# Patient Record
Sex: Female | Born: 1980 | Race: Asian | Hispanic: No | Marital: Married | Smoking: Never smoker
Health system: Southern US, Community
[De-identification: ages and names within clinical notes are randomized; demographics above are authoritative.]

## PROBLEM LIST (undated history)

## (undated) DIAGNOSIS — A048 Other specified bacterial intestinal infections: Secondary | ICD-10-CM

## (undated) DIAGNOSIS — O24419 Gestational diabetes mellitus in pregnancy, unspecified control: Secondary | ICD-10-CM

## (undated) DIAGNOSIS — J101 Influenza due to other identified influenza virus with other respiratory manifestations: Secondary | ICD-10-CM

## (undated) HISTORY — PX: NO PAST SURGERIES: SHX2092

## (undated) HISTORY — DX: Other specified bacterial intestinal infections: A04.8

## (undated) HISTORY — DX: Influenza due to other identified influenza virus with other respiratory manifestations: J10.1

---

## 2013-10-09 ENCOUNTER — Other Ambulatory Visit (HOSPITAL_COMMUNITY): Payer: Self-pay | Admitting: Nurse Practitioner

## 2013-10-09 DIAGNOSIS — Z3689 Encounter for other specified antenatal screening: Secondary | ICD-10-CM

## 2013-10-09 LAB — OB RESULTS CONSOLE HEPATITIS B SURFACE ANTIGEN: HEP B S AG: NEGATIVE

## 2013-10-09 LAB — OB RESULTS CONSOLE GC/CHLAMYDIA
CHLAMYDIA, DNA PROBE: NEGATIVE
Gonorrhea: NEGATIVE

## 2013-10-09 LAB — OB RESULTS CONSOLE RUBELLA ANTIBODY, IGM: Rubella: IMMUNE

## 2013-10-09 LAB — OB RESULTS CONSOLE HIV ANTIBODY (ROUTINE TESTING): HIV: NONREACTIVE

## 2013-10-09 LAB — OB RESULTS CONSOLE ANTIBODY SCREEN: Antibody Screen: NEGATIVE

## 2013-10-09 LAB — OB RESULTS CONSOLE ABO/RH: RH Type: POSITIVE

## 2013-10-09 LAB — OB RESULTS CONSOLE RPR: RPR: NONREACTIVE

## 2013-10-15 ENCOUNTER — Other Ambulatory Visit (HOSPITAL_COMMUNITY): Payer: Self-pay | Admitting: Nurse Practitioner

## 2013-10-15 ENCOUNTER — Ambulatory Visit (HOSPITAL_COMMUNITY)
Admission: RE | Admit: 2013-10-15 | Discharge: 2013-10-15 | Disposition: A | Discharge status: Home / Self Care | Payer: Medicaid Other | Source: Ambulatory Visit | Attending: Nurse Practitioner | Admitting: Nurse Practitioner

## 2013-10-15 ENCOUNTER — Encounter (HOSPITAL_COMMUNITY): Payer: Self-pay

## 2013-10-15 DIAGNOSIS — Z3689 Encounter for other specified antenatal screening: Secondary | ICD-10-CM

## 2013-10-15 DIAGNOSIS — O358XX0 Maternal care for other (suspected) fetal abnormality and damage, not applicable or unspecified: Secondary | ICD-10-CM | POA: Insufficient documentation

## 2013-10-15 DIAGNOSIS — Z363 Encounter for antenatal screening for malformations: Secondary | ICD-10-CM | POA: Insufficient documentation

## 2013-10-15 DIAGNOSIS — Z1389 Encounter for screening for other disorder: Secondary | ICD-10-CM | POA: Insufficient documentation

## 2013-12-04 ENCOUNTER — Other Ambulatory Visit (HOSPITAL_COMMUNITY): Payer: Self-pay | Admitting: Nurse Practitioner

## 2013-12-04 DIAGNOSIS — O26843 Uterine size-date discrepancy, third trimester: Secondary | ICD-10-CM

## 2013-12-05 ENCOUNTER — Ambulatory Visit (HOSPITAL_COMMUNITY)
Admission: RE | Admit: 2013-12-05 | Discharge: 2013-12-05 | Disposition: A | Discharge status: Home / Self Care | Payer: Medicaid Other | Source: Ambulatory Visit | Attending: Nurse Practitioner | Admitting: Nurse Practitioner

## 2013-12-05 DIAGNOSIS — O365990 Maternal care for other known or suspected poor fetal growth, unspecified trimester, not applicable or unspecified: Secondary | ICD-10-CM | POA: Insufficient documentation

## 2013-12-05 DIAGNOSIS — O26843 Uterine size-date discrepancy, third trimester: Secondary | ICD-10-CM

## 2013-12-05 DIAGNOSIS — Z3689 Encounter for other specified antenatal screening: Secondary | ICD-10-CM | POA: Insufficient documentation

## 2013-12-11 LAB — OB RESULTS CONSOLE GBS: GBS: NEGATIVE

## 2013-12-18 NOTE — L&D Delivery Note (Signed)
Delivery Note At 5:43 AM a viable and healthy female was delivered via Vaginal, Spontaneous Delivery (Presentation: ROA;  ).  APGAR: 9, 9; weight .   Placenta status: Intact, Spontaneous.  Cord: delivered, intact with the following complications: None.   Anesthesia: None  Episiotomy: None Lacerations: None Suture Repair: n/a Est. Blood Loss (mL): 350  Precipitous delivery with vomiting was performed by RN. I arrived immediately after baby. Placenta delivered without difficulty. Minimal lower labial tear did not require repair. Mom to postpartum.  Baby to Couplet care / Skin to Skin. Placenta to birthing suites.  Beverely Lowdamo, Shamira Toutant 01/15/2014, 6:07 AM

## 2013-12-18 NOTE — L&D Delivery Note (Signed)
I have seen and examined this patient and I agree with the above. I arrived shortly after Dr Richarda BladeAdamo and was present for del of placenta and inspection. SHAW, KIMBERLY 6:18 AM 01/15/2014

## 2013-12-20 ENCOUNTER — Encounter (HOSPITAL_COMMUNITY): Payer: Self-pay | Admitting: Emergency Medicine

## 2013-12-20 ENCOUNTER — Emergency Department (HOSPITAL_COMMUNITY)
Admission: EM | Admit: 2013-12-20 | Discharge: 2013-12-20 | Disposition: A | Discharge status: Home / Self Care | Payer: Medicaid Other | Attending: Emergency Medicine | Admitting: Emergency Medicine

## 2013-12-20 DIAGNOSIS — N949 Unspecified condition associated with female genital organs and menstrual cycle: Secondary | ICD-10-CM

## 2013-12-20 DIAGNOSIS — O9989 Other specified diseases and conditions complicating pregnancy, childbirth and the puerperium: Secondary | ICD-10-CM | POA: Insufficient documentation

## 2013-12-20 DIAGNOSIS — R1032 Left lower quadrant pain: Secondary | ICD-10-CM | POA: Insufficient documentation

## 2013-12-20 NOTE — Progress Notes (Signed)
Spoke with Dr. Shawnie PonsPratt. She has veiwed the fhr tracing. If pt's cervix is closed, she can be d/c home.

## 2013-12-20 NOTE — ED Notes (Signed)
Language barrier, pt speaks Doyce Looseoe Karen & interpreter phone used. Pt c/o left groin pain x 1 week. Pain only with mvmt, walking. Saw OB/GYN Monday for same, has next appt this Monday. Reports pain is not getting better but is not any worse.  Denies any vaginal bleeding, discharge, contractions. EDD 01-05-14

## 2013-12-20 NOTE — ED Notes (Signed)
Per OBGYN RR Nurse pt is appropriate for discharge per [employer] Pr[employer]. MD Juleen ChinaKohut made aware.

## 2013-12-20 NOTE — ED Notes (Signed)
Pt does not speak english, used translator phones. Pt reports walking long distance several days ago and having severe constant groin/pelvic pain since then. Denies any vaginal discharge or bleeding.

## 2013-12-20 NOTE — ED Notes (Signed)
OB RRT nurse at bedside

## 2013-12-20 NOTE — Progress Notes (Signed)
Pt is 375/[redacted] weeks pregnant. Does not speak english. Through an interpreter on the language line, the pt denies ant vaginal bleeding, leaking of fluid. Pt states that she is having left groin pain that has been lasting for a few days now. Pt says that she has the pain when she is up and walking  Around Says that she does not feel the pain when she is sitting.

## 2013-12-22 ENCOUNTER — Ambulatory Visit (HOSPITAL_COMMUNITY)
Admission: RE | Admit: 2013-12-22 | Discharge: 2013-12-22 | Disposition: A | Discharge status: Home / Self Care | Payer: Medicaid Other | Source: Ambulatory Visit | Attending: Nurse Practitioner | Admitting: Nurse Practitioner

## 2013-12-22 ENCOUNTER — Other Ambulatory Visit (HOSPITAL_COMMUNITY): Payer: Self-pay | Admitting: Nurse Practitioner

## 2013-12-22 DIAGNOSIS — O365990 Maternal care for other known or suspected poor fetal growth, unspecified trimester, not applicable or unspecified: Secondary | ICD-10-CM | POA: Insufficient documentation

## 2013-12-22 DIAGNOSIS — Z3689 Encounter for other specified antenatal screening: Secondary | ICD-10-CM

## 2013-12-26 NOTE — ED Provider Notes (Signed)
CSN: [id number]     Arrival date & time 12/20/13  1244 History   First MD Initiated Contact with Patient 12/20/13 1311     Chief Complaint  Patient presents with  . Groin Pain   (Consider location/radiation/quality/duration/timing/severity/associated sxs/prior Treatment) HPI  33 year old female with left lower quadrant/left groin pain. Onset about a week ago after walking for a long distance. Pain has been constant since then. Worse with ambulation and movement. Patient is almost [redacted] weeks pregnant. She denies any vaginal bleeding. No leakage of fluid. No contractions. No acute back pain.  History reviewed. No pertinent past medical history. History reviewed. No pertinent past surgical history. History reviewed. No pertinent family history. History  Substance Use Topics  . Smoking status: Not on file  . Smokeless tobacco: Not on file  . Alcohol Use: No   OB History   Grav Para Term Preterm Abortions TAB SAB Ect Mult Living   3              Review of Systems  All systems reviewed and negative, other than as noted in HPI.   Allergies  Review of patient's allergies indicates no known allergies.  Home Medications  No current outpatient prescriptions on file. BP 96/69  Pulse 76  Resp 16  SpO2 99% Physical Exam  Nursing note and vitals reviewed. Constitutional: She appears well-developed and well-nourished. No distress.  HENT:  Head: Normocephalic and atraumatic.  Eyes: Conjunctivae are normal. Right eye exhibits no discharge. Left eye exhibits no discharge.  Neck: Neck supple.  Cardiovascular: Normal rate, regular rhythm and normal heart sounds.  Exam reveals no gallop and no friction rub.   No murmur heard. Pulmonary/Chest: Effort normal and breath sounds normal. No respiratory distress.  Abdominal: Soft. There is no tenderness.  Gravid uterus with fundus palpable well above umbilicus. Nontender. Bedside ultrasound with fetal movement and heart rate in the 140s.   Musculoskeletal: She exhibits no edema and no tenderness.  Neurological: She is alert.  Skin: Skin is warm and dry.  Psychiatric: She has a normal mood and affect. Her behavior is normal. Thought content normal.    ED Course  Procedures (including critical care time) Labs Review Labs Reviewed - No data to display Imaging Review No results found.  EKG Interpretation   None       MDM   1. Round ligament pain    33 year old female with left lower quadrant/left inguinal pain. Suspect round ligament pain. Patient's abdomen is benign. Reassuring fetal monitoring. No bleeding or leakage of fluid. Patient has followup early next week with obstetrics. Return precautions discussed.    Raeford RazorStephen Tamar Lipscomb, MD 12/26/13 [phone]

## 2014-01-08 ENCOUNTER — Other Ambulatory Visit (HOSPITAL_COMMUNITY): Payer: Self-pay | Admitting: Family

## 2014-01-08 DIAGNOSIS — O480 Post-term pregnancy: Secondary | ICD-10-CM

## 2014-01-09 ENCOUNTER — Telehealth (HOSPITAL_COMMUNITY): Payer: Self-pay | Admitting: *Deleted

## 2014-01-09 ENCOUNTER — Encounter (HOSPITAL_COMMUNITY): Payer: Self-pay | Admitting: *Deleted

## 2014-01-09 NOTE — Telephone Encounter (Signed)
Preadmission screen Interpreter number [facility]

## 2014-01-12 ENCOUNTER — Ambulatory Visit (HOSPITAL_COMMUNITY): Payer: Medicaid Other

## 2014-01-13 ENCOUNTER — Ambulatory Visit (HOSPITAL_COMMUNITY)
Admission: RE | Admit: 2014-01-13 | Discharge: 2014-01-13 | Disposition: A | Discharge status: Home / Self Care | Payer: Medicaid Other | Source: Ambulatory Visit | Attending: Family | Admitting: Family

## 2014-01-13 ENCOUNTER — Ambulatory Visit (HOSPITAL_COMMUNITY): Payer: Medicaid Other

## 2014-01-13 DIAGNOSIS — Z3689 Encounter for other specified antenatal screening: Secondary | ICD-10-CM

## 2014-01-13 DIAGNOSIS — O480 Post-term pregnancy: Secondary | ICD-10-CM | POA: Insufficient documentation

## 2014-01-13 DIAGNOSIS — O0930 Supervision of pregnancy with insufficient antenatal care, unspecified trimester: Secondary | ICD-10-CM

## 2014-01-14 ENCOUNTER — Inpatient Hospital Stay (HOSPITAL_COMMUNITY)
Admission: RE | Admit: 2014-01-14 | Discharge: 2014-01-16 | DRG: 775 | Disposition: A | Discharge status: Home / Self Care | Payer: Medicaid Other | Source: Ambulatory Visit | Attending: Obstetrics & Gynecology | Admitting: Obstetrics & Gynecology

## 2014-01-14 ENCOUNTER — Encounter (HOSPITAL_COMMUNITY): Payer: Self-pay

## 2014-01-14 VITALS — BP 98/62 | HR 74 | Temp 98.5°F | Resp 20 | Ht 60.0 in | Wt 125.0 lb

## 2014-01-14 DIAGNOSIS — O480 Post-term pregnancy: Secondary | ICD-10-CM

## 2014-01-14 DIAGNOSIS — O0930 Supervision of pregnancy with insufficient antenatal care, unspecified trimester: Secondary | ICD-10-CM

## 2014-01-14 DIAGNOSIS — Z3A41 41 weeks gestation of pregnancy: Secondary | ICD-10-CM

## 2014-01-14 LAB — TYPE AND SCREEN
ABO/RH(D): O POS
ANTIBODY SCREEN: NEGATIVE

## 2014-01-14 LAB — CBC
HEMATOCRIT: 35.8 % — AB (ref 36.0–46.0)
HEMOGLOBIN: 12.2 g/dL (ref 12.0–15.0)
MCH: 33.9 pg (ref 26.0–34.0)
MCHC: 34.1 g/dL (ref 30.0–36.0)
MCV: 99.4 fL (ref 78.0–100.0)
Platelets: 224 10*3/uL (ref 150–400)
RBC: 3.6 MIL/uL — AB (ref 3.87–5.11)
RDW: 13 % (ref 11.5–15.5)
WBC: 10.3 10*3/uL (ref 4.0–10.5)

## 2014-01-14 MED ORDER — ONDANSETRON HCL 4 MG/2ML IJ SOLN: 4.0000 mg | Freq: Four times a day (QID) | INTRAMUSCULAR | Status: DC | PRN

## 2014-01-14 MED ORDER — LACTATED RINGERS IV SOLN
INTRAVENOUS | Status: DC
  Administered 2014-01-14 – 2014-01-15 (×2): via INTRAVENOUS

## 2014-01-14 MED ORDER — CITRIC ACID-SODIUM CITRATE 334-500 MG/5ML PO SOLN: 30.0000 mL | ORAL | Status: DC | PRN

## 2014-01-14 MED ORDER — OXYTOCIN BOLUS FROM INFUSION: 500.0000 mL | INTRAVENOUS | Status: DC

## 2014-01-14 MED ORDER — IBUPROFEN 600 MG PO TABS: 600.0000 mg | ORAL_TABLET | Freq: Four times a day (QID) | ORAL | Status: DC | PRN

## 2014-01-14 MED ORDER — LACTATED RINGERS IV SOLN: 500.0000 mL | INTRAVENOUS | Status: DC | PRN

## 2014-01-14 MED ORDER — ZOLPIDEM TARTRATE 5 MG PO TABS: 5.0000 mg | ORAL_TABLET | Freq: Once | ORAL | Status: DC

## 2014-01-14 MED ORDER — OXYCODONE-ACETAMINOPHEN 5-325 MG PO TABS: 1.0000 | ORAL_TABLET | ORAL | Status: DC | PRN

## 2014-01-14 MED ORDER — OXYTOCIN 40 UNITS IN LACTATED RINGERS INFUSION - SIMPLE MED
62.5000 mL/h | INTRAVENOUS | Status: DC
  Filled 2014-01-14: qty 1000

## 2014-01-14 MED ORDER — LIDOCAINE HCL (PF) 1 % IJ SOLN
30.0000 mL | INTRAMUSCULAR | Status: DC | PRN
  Filled 2014-01-14: qty 30

## 2014-01-14 MED ORDER — ACETAMINOPHEN 325 MG PO TABS: 650.0000 mg | ORAL_TABLET | ORAL | Status: DC | PRN

## 2014-01-14 NOTE — Progress Notes (Signed)
RN utilizing [employer] Interpreting Service to admit pt.  No interpreter available that speaks pt's language (Pokoran) but one available that speaks pt's spouse's language.  Interpreter speaking to pt's husband via third party call in order to complete admission.

## 2014-01-14 NOTE — H&P (Signed)
Teresa Clark is a 33 y.o. female presenting for induction for post-dates. Pt reports irregular contractions that are uncomfortable but not very painful. She reports good fetal movement. No bleeding or leakage of fluid. Maternal Medical History:  Reason for admission: Nausea.  Contractions: Frequency: rare.   Perceived severity is mild.    Fetal activity: Perceived fetal activity is normal.    Prenatal complications: No bleeding.   Prenatal Complications - Diabetes: none.    OB History   Grav Para Term Preterm Abortions TAB SAB Ect Mult Living   3 2 2       2      No past medical history on file. No past surgical history on file. Family History: family history is not on file. Social History:  reports that she has never smoked. She does not have any smokeless tobacco history on file. She reports that she does not drink alcohol or use illicit drugs.   Prenatal Transfer Tool  Maternal Diabetes: No Genetic Screening: Normal Maternal Ultrasounds/Referrals: Normal Fetal Ultrasounds or other Referrals:  None Maternal Substance Abuse:  No Significant Maternal Medications:  None Significant Maternal Lab Results:  Lab values include: Group B Strep negative, Other: varicella non-immune Other Comments:  late prenatal care, language barrier  Review of Systems  Constitutional: Negative for fever.  Respiratory: Negative for cough.   Gastrointestinal: Negative for nausea, vomiting and abdominal pain.  Neurological: Negative for headaches.  All other systems reviewed and are negative.    Dilation: 1.5 Effacement (%): 20 Station: -2 Exam by:: Dr. Richarda BladeAdamo Blood pressure 117/74, pulse 73, temperature 98 F (36.7 C), temperature source Oral, resp. rate 20, last menstrual period 03/30/2013. Maternal Exam:  Uterine Assessment: Contraction strength is moderate.  Contraction duration is 80 seconds. Contraction frequency is irregular and rare.   Abdomen: Fetal presentation: vertex  Introitus:  Normal vulva. Normal vagina.  Vagina is negative for discharge.  Pelvis: adequate for delivery.   Cervix: Cervix evaluated by digital exam.     Fetal Exam Fetal Monitor Review: Mode: ultrasound.   Baseline rate: 135.  Variability: moderate (6-25 bpm).   Pattern: accelerations present and no decelerations.    Fetal State Assessment: Category I - tracings are normal.     Physical Exam  Nursing note and vitals reviewed. Constitutional: She is oriented to person, place, and time. She appears well-developed and well-nourished. No distress.  HENT:  Head: Normocephalic and atraumatic.  Eyes: Conjunctivae are normal. Right eye exhibits no discharge. Left eye exhibits no discharge. No scleral icterus.  Neck: Normal range of motion.  Cardiovascular: Normal rate.   Respiratory: Effort normal.  GI: Soft. There is no tenderness.  Genitourinary: Vagina normal and uterus normal. No vaginal discharge found.  Musculoskeletal: Normal range of motion. She exhibits no edema and no tenderness.  Neurological: She is alert and oriented to person, place, and time.  Skin: Skin is warm and dry. She is not diaphoretic.  Psychiatric: She has a normal mood and affect. Her behavior is normal.    Prenatal labs: ABO, Rh: O/Positive/-- (10/23 0000) Antibody: Negative (10/23 0000) Rubella: Immune (10/23 0000) RPR: Nonreactive (10/23 0000)  HBsAg: Negative (10/23 0000)  HIV: Non-reactive (10/23 0000)  GBS: Negative (12/25 0000)   Assessment/Plan: Pt is a 33 y.o. G3P2002 at 9825w3d who presents for induction. LaborAdmitPlan #Labor: Rare, irregular contractions and cervix 1.5cm. Will place foley bulb #Pain: epidural on request #FWB: category 1 #ID: GBS negative    Beverely Lowdamo, Elena 01/14/2014, 9:48 PM  I have  seen and examined this patient and I agree with the above. Cam Hai 12:42 AM 01/15/2014

## 2014-01-15 ENCOUNTER — Encounter (HOSPITAL_COMMUNITY): Payer: Self-pay

## 2014-01-15 LAB — RPR: RPR: NONREACTIVE

## 2014-01-15 LAB — ABO/RH: ABO/RH(D): O POS

## 2014-01-15 MED ORDER — FENTANYL CITRATE 0.05 MG/ML IJ SOLN
100.0000 ug | INTRAMUSCULAR | Status: DC | PRN
  Administered 2014-01-15 (×2): 100 ug via INTRAVENOUS
  Filled 2014-01-15 (×2): qty 2

## 2014-01-15 MED ORDER — ZOLPIDEM TARTRATE 5 MG PO TABS: 5.0000 mg | ORAL_TABLET | Freq: Every evening | ORAL | Status: DC | PRN

## 2014-01-15 MED ORDER — DIBUCAINE 1 % RE OINT: 1.0000 "application " | TOPICAL_OINTMENT | RECTAL | Status: DC | PRN

## 2014-01-15 MED ORDER — WITCH HAZEL-GLYCERIN EX PADS: 1.0000 "application " | MEDICATED_PAD | CUTANEOUS | Status: DC | PRN

## 2014-01-15 MED ORDER — SENNOSIDES-DOCUSATE SODIUM 8.6-50 MG PO TABS
2.0000 | ORAL_TABLET | ORAL | Status: DC
  Filled 2014-01-15: qty 2

## 2014-01-15 MED ORDER — ONDANSETRON HCL 4 MG PO TABS: 4.0000 mg | ORAL_TABLET | ORAL | Status: DC | PRN

## 2014-01-15 MED ORDER — TETANUS-DIPHTH-ACELL PERTUSSIS 5-2.5-18.5 LF-MCG/0.5 IM SUSP: 0.5000 mL | Freq: Once | INTRAMUSCULAR | Status: DC

## 2014-01-15 MED ORDER — DIPHENHYDRAMINE HCL 25 MG PO CAPS: 25.0000 mg | ORAL_CAPSULE | Freq: Four times a day (QID) | ORAL | Status: DC | PRN

## 2014-01-15 MED ORDER — OXYCODONE-ACETAMINOPHEN 5-325 MG PO TABS: 1.0000 | ORAL_TABLET | ORAL | Status: DC | PRN

## 2014-01-15 MED ORDER — BENZOCAINE-MENTHOL 20-0.5 % EX AERO: 1.0000 "application " | INHALATION_SPRAY | CUTANEOUS | Status: DC | PRN

## 2014-01-15 MED ORDER — ONDANSETRON HCL 4 MG/2ML IJ SOLN: 4.0000 mg | INTRAMUSCULAR | Status: DC | PRN

## 2014-01-15 MED ORDER — IBUPROFEN 600 MG PO TABS
600.0000 mg | ORAL_TABLET | Freq: Four times a day (QID) | ORAL | Status: DC
  Administered 2014-01-15 (×2): 600 mg via ORAL
  Filled 2014-01-15 (×2): qty 1

## 2014-01-15 MED ORDER — SIMETHICONE 80 MG PO CHEW: 80.0000 mg | CHEWABLE_TABLET | ORAL | Status: DC | PRN

## 2014-01-15 MED ORDER — PRENATAL MULTIVITAMIN CH: 1.0000 | ORAL_TABLET | Freq: Every day | ORAL | Status: DC

## 2014-01-15 MED ORDER — LANOLIN HYDROUS EX OINT: TOPICAL_OINTMENT | CUTANEOUS | Status: DC | PRN

## 2014-01-15 MED ORDER — MISOPROSTOL 200 MCG PO TABS
ORAL_TABLET | ORAL | Status: AC
  Filled 2014-01-15: qty 5

## 2014-01-15 NOTE — Progress Notes (Signed)
UR completed 

## 2014-01-15 NOTE — Progress Notes (Signed)
I have admitted patient to mother baby room with assistance of an interpreter via speaker phone, which signed paper to be her interpreter.  Assessment was done, paperwork, meal ordered, room safety and baby safety were completed by myself and the nursery RN, Wyona Almasllen Branstrator, again with assistance from the interpreter on speaker phone.  Mother and father have no further questions at this time.

## 2014-01-16 MED ORDER — IBUPROFEN 600 MG PO TABS: 600.0000 mg | ORAL_TABLET | Freq: Four times a day (QID) | ORAL | Status: DC

## 2014-01-16 NOTE — Discharge Summary (Signed)
Obstetric Discharge Summary Reason for Admission: induction of labor  For postdates Prenatal Procedures: NST Intrapartum Procedures: spontaneous vaginal delivery precipitously in the bed with vomiting.  Postpartum Procedures: none Complications-Operative and Postpartum: none Hemoglobin  Date Value Range Status  01/14/2014 12.2  12.0 - 15.0 g/dL Final     HCT  Date Value Range Status  01/14/2014 35.8* 36.0 - 46.0 % Final    Physical Exam:  General: alert, cooperative and no distress Lochia: appropriate Uterine Fundus: firm Incision: na DVT Evaluation: No evidence of DVT seen on physical exam.  Discharge Diagnoses: postdates pregnancy delivered  Discharge Information: Date: 01/16/2014 Activity: pelvic rest Diet: routine Medications: PNV and Ibuprofen Condition: stable Instructions: refer to practice specific booklet Discharge to: home Follow-up Information   [facility] Schedule an appointment as soon as possible for a visit in 4 weeks.   Contact information:   [address] [place] [address] [phone]      Newborn Data: Live born female  Birth Weight: 6 lb 15.6 oz (3165 g) APGAR: 9, 9  Home with mother.  PT presented for postdates induction and progressed well to deliver a liveborn female via NSVD. Her postpartum care was uncomplicated. She is breast feeding and desires depo for contraception.    Candace Begue L 01/16/2014, 7:43 AM

## 2014-01-16 NOTE — Progress Notes (Signed)
[facility]'s [facility] up [facility]. Informed that nurse at health department will call patient to set up follow up appointment.

## 2014-01-16 NOTE — Discharge Instructions (Signed)

## 2014-01-19 NOTE — Discharge Summary (Signed)
Attestation of Attending Supervision of Obstetric Fellow: Evaluation and management procedures were performed by the Obstetric Fellow under my supervision and collaboration.  I have reviewed the Obstetric Fellow's note and chart, and I agree with the management and plan.  Ziad Maye, MD, FACOG Attending Obstetrician & Gynecologist Faculty Practice, [facility]   

## 2014-10-19 ENCOUNTER — Encounter (HOSPITAL_COMMUNITY): Payer: Self-pay

## 2015-09-14 LAB — GLUCOSE, POCT (MANUAL RESULT ENTRY): POC Glucose: 83 mg/dl (ref 70–99)

## 2016-10-17 ENCOUNTER — Encounter (HOSPITAL_COMMUNITY): Payer: Self-pay | Admitting: Emergency Medicine

## 2016-10-17 DIAGNOSIS — R197 Diarrhea, unspecified: Secondary | ICD-10-CM | POA: Insufficient documentation

## 2016-10-17 LAB — COMPREHENSIVE METABOLIC PANEL
ALK PHOS: 63 U/L (ref 38–126)
ALT: 16 U/L (ref 14–54)
AST: 19 U/L (ref 15–41)
Albumin: 4.7 g/dL (ref 3.5–5.0)
Anion gap: 7 (ref 5–15)
BILIRUBIN TOTAL: 0.8 mg/dL (ref 0.3–1.2)
BUN: 11 mg/dL (ref 6–20)
CALCIUM: 10.2 mg/dL (ref 8.9–10.3)
CO2: 22 mmol/L (ref 22–32)
Chloride: 108 mmol/L (ref 101–111)
Creatinine, Ser: 0.88 mg/dL (ref 0.44–1.00)
GFR calc Af Amer: >60 mL/min (ref >60)
Glucose, Bld: 109 mg/dL — ABNORMAL HIGH (ref 65–99)
POTASSIUM: 3.3 mmol/L — AB (ref 3.5–5.1)
Sodium: 137 mmol/L (ref 135–145)
TOTAL PROTEIN: 8.1 g/dL (ref 6.5–8.1)

## 2016-10-17 LAB — URINALYSIS, ROUTINE W REFLEX MICROSCOPIC
BILIRUBIN URINE: NEGATIVE
Glucose, UA: NEGATIVE mg/dL
Hgb urine dipstick: NEGATIVE
KETONES UR: NEGATIVE mg/dL
LEUKOCYTES UA: NEGATIVE
NITRITE: NEGATIVE
PH: 6 (ref 5.0–8.0)
PROTEIN: NEGATIVE mg/dL
Specific Gravity, Urine: 1.018 (ref 1.005–1.030)

## 2016-10-17 LAB — CBC
HEMATOCRIT: 41.5 % (ref 36.0–46.0)
Hemoglobin: 14.1 g/dL (ref 12.0–15.0)
MCH: 31.8 pg (ref 26.0–34.0)
MCHC: 34 g/dL (ref 30.0–36.0)
MCV: 93.5 fL (ref 78.0–100.0)
PLATELETS: 298 10*3/uL (ref 150–400)
RBC: 4.44 MIL/uL (ref 3.87–5.11)
RDW: 12.1 % (ref 11.5–15.5)
WBC: 11.6 10*3/uL — AB (ref 4.0–10.5)

## 2016-10-17 LAB — LIPASE, BLOOD: Lipase: 26 U/L (ref 11–51)

## 2016-10-17 LAB — I-STAT BETA HCG BLOOD, ED (MC, WL, AP ONLY)

## 2016-10-17 NOTE — ED Triage Notes (Signed)
Pt c/o abdominal cramping and diarrhea x20 times.  Sates other family members have the same symptoms. Pain 9/10. Vss.

## 2016-10-18 ENCOUNTER — Emergency Department (HOSPITAL_COMMUNITY)
Admission: EM | Admit: 2016-10-18 | Discharge: 2016-10-18 | Disposition: A | Discharge status: Home / Self Care | Payer: BLUE CROSS/BLUE SHIELD | Attending: Emergency Medicine | Admitting: Emergency Medicine

## 2016-10-18 DIAGNOSIS — R197 Diarrhea, unspecified: Secondary | ICD-10-CM | POA: Diagnosis not present

## 2016-10-18 DIAGNOSIS — R1084 Generalized abdominal pain: Secondary | ICD-10-CM

## 2016-10-18 MED ORDER — KETOROLAC TROMETHAMINE 30 MG/ML IJ SOLN
30.0000 mg | Freq: Once | INTRAMUSCULAR | Status: AC
Start: 2016-10-18 — End: 2016-10-18
  Administered 2016-10-18: 30 mg via INTRAVENOUS
  Filled 2016-10-18: qty 1

## 2016-10-18 MED ORDER — SODIUM CHLORIDE 0.9 % IV BOLUS (SEPSIS)
1000.0000 mL | Freq: Once | INTRAVENOUS | Status: AC
  Administered 2016-10-18: 1000 mL via INTRAVENOUS

## 2016-10-18 NOTE — ED Provider Notes (Signed)
[facility]EMER[facility]ENCY DEPT Provider Note   CSN: [id number] Arrival date & time: 10/17/16  1943   By signing my name below, I, Freida Busmaniana Omoyeni, attest that this documentation has been prepared under the direction and in the presence of Azalia BilisKevin Orenthal Debski, MD . Electronically Signed: Freida Busmaniana Omoyeni, Scribe. 10/18/2016. 12:58 AM.   History   Chief Complaint Chief Complaint  Patient presents with  . Diarrhea  . Abdominal Pain     The history is provided by the patient. No language interpreter was used.    HPI Comments:  Teresa Clark is a 35 y.o. female who presents to the Emergency Department complaining of multiple episodes of diarrhea with associated upper abdominal pain since 10/16/16. She notes her abdominal pain is worse when she has an episode of diarrhea. Pt also notes decreased appetite and back pain. She denies vomiting, fever, and dysuria. No alleviating factors noted. Positive sick contacts, pt's daughters with similar symptoms.   History reviewed. No pertinent past medical history.  Patient Active Problem List   Diagnosis Date Noted  . Post term pregnancy, 41 weeks 01/14/2014    History reviewed. No pertinent surgical history.  OB History    Gravida Para Term Preterm AB Living   3 3 3     3    SAB TAB Ectopic Multiple Live Births           3       Home Medications    Prior to Admission medications   Medication Sig Start Date End Date Taking? Authorizing Provider  ibuprofen (ADVIL,MOTRIN) 600 MG tablet Take 1 tablet (600 mg total) by mouth every 6 (six) hours. 01/16/14   Vale HavenKeli L Beck, MD  Prenatal Vit-Fe Fumarate-FA (MULTIVITAMIN-PRENATAL) 27-0.8 MG TABS tablet Take 1 tablet by mouth daily at 12 noon.    Historical Provider, MD    Family History No family history on file.  Social History Social History  Substance Use Topics  . Smoking status: Never Smoker  . Smokeless tobacco: Not on file  . Alcohol use No     Allergies   Review of patient's allergies indicates no  known allergies.   Review of Systems Review of Systems  10 systems reviewed and all are negative for acute change except as noted in the HPI.  Physical Exam Updated Vital Signs BP 106/71   Pulse (!) 57   Temp 98.6 F (37 C) (Oral)   Resp 18   LMP  (LMP Unknown)   SpO2 99%   Physical Exam  Constitutional: She is oriented to person, place, and time. She appears well-developed and well-nourished. No distress.  HENT:  Head: Normocephalic and atraumatic.  Eyes: EOM are normal.  Neck: Normal range of motion.  Cardiovascular: Normal rate, regular rhythm and normal heart sounds.   Pulmonary/Chest: Effort normal and breath sounds normal.  Abdominal: Soft. She exhibits no distension. There is no tenderness.  Musculoskeletal: Normal range of motion.  Neurological: She is alert and oriented to person, place, and time.  Skin: Skin is warm and dry.  Psychiatric: She has a normal mood and affect. Judgment normal.  Nursing note and vitals reviewed.    ED Treatments / Results  DIAGNOSTIC STUDIES:  Oxygen Saturation is 99% on RA, normal by my interpretation.    COORDINATION OF CARE:  12:58 AM Discussed treatment plan with pt at bedside and pt agreed to plan.  Labs (all labs ordered are listed, but only abnormal results are displayed) Labs Reviewed  COMPREHENSIVE METABOLIC PANEL - Abnormal;  Notable for the following:       Result Value   Potassium 3.3 (*)    Glucose, Bld 109 (*)    All other components within normal limits  CBC - Abnormal; Notable for the following:    WBC 11.6 (*)    All other components within normal limits  URINALYSIS, ROUTINE W REFLEX MICROSCOPIC (NOT AT [facility]) - Abnormal; Notable for the following:    APPearance CLOUDY (*)    All other components within normal limits  LIPASE, BLOOD  I-STAT BETA HCG BLOOD, ED ([facility], [facility], [facility] ONLY)    EKG  EKG Interpretation None       Radiology No results found.  Procedures Procedures (including critical care  time)  Medications Ordered in ED Medications - No data to display   Initial Impression / Assessment and Plan / ED Course  I have reviewed the triage vital signs and the nursing notes.  Pertinent labs & imaging results that were available during my care of the patient were reviewed by me and considered in my medical decision making (see chart for details).  Clinical Course    2:58 AM Patient feels much better this time.  Repeat abdominal exam without tenderness.  Labs without significant abnormality.  Likely contagious diarrhea from her children.  Discharge home in good condition.  Instructed to continue oral hydration at home.  She understands to return to the ER for new or worsening symptoms  Final Clinical Impressions(s) / ED Diagnoses   Final diagnoses:  None    New Prescriptions New Prescriptions   No medications on file   I personally performed the services described in this documentation, which was scribed in my presence. The recorded information has been reviewed and is accurate.        Azalia BilisKevin Kc Sedlak, MD 10/18/16 [phone]

## 2016-10-18 NOTE — ED Notes (Signed)
Pt verbalized understanding discharge instructions and denies any further needs or questions at this time. VS stable, ambulatory and steady gait.   

## 2016-11-08 ENCOUNTER — Ambulatory Visit (INDEPENDENT_AMBULATORY_CARE_PROVIDER_SITE_OTHER): Payer: BLUE CROSS/BLUE SHIELD | Admitting: Obstetrics and Gynecology

## 2016-11-08 ENCOUNTER — Encounter: Payer: Self-pay | Admitting: Obstetrics and Gynecology

## 2016-11-08 VITALS — BP 109/75 | HR 61 | Wt 122.8 lb

## 2016-11-08 DIAGNOSIS — Z01411 Encounter for gynecological examination (general) (routine) with abnormal findings: Secondary | ICD-10-CM

## 2016-11-08 DIAGNOSIS — R1030 Lower abdominal pain, unspecified: Secondary | ICD-10-CM

## 2016-11-08 DIAGNOSIS — Z124 Encounter for screening for malignant neoplasm of cervix: Secondary | ICD-10-CM

## 2016-11-08 DIAGNOSIS — Z113 Encounter for screening for infections with a predominantly sexual mode of transmission: Secondary | ICD-10-CM | POA: Diagnosis not present

## 2016-11-08 DIAGNOSIS — Z01419 Encounter for gynecological examination (general) (routine) without abnormal findings: Secondary | ICD-10-CM

## 2016-11-08 DIAGNOSIS — Z1151 Encounter for screening for human papillomavirus (HPV): Secondary | ICD-10-CM | POA: Diagnosis not present

## 2016-11-08 LAB — POCT URINALYSIS DIP (DEVICE)
BILIRUBIN URINE: NEGATIVE
Glucose, UA: NEGATIVE mg/dL
HGB URINE DIPSTICK: NEGATIVE
KETONES UR: NEGATIVE mg/dL
Nitrite: NEGATIVE
PH: 6 (ref 5.0–8.0)
Protein, ur: NEGATIVE mg/dL
SPECIFIC GRAVITY, URINE: 1.02 (ref 1.005–1.030)
Urobilinogen, UA: 0.2 mg/dL (ref 0.0–1.0)

## 2016-11-08 NOTE — Progress Notes (Signed)
Subjective:     Teresa Clark is a 35 y.o. female G3P3 who is here for a comprehensive physical exam. The patient reports lower pelvic pain for the past 2 months. The pain is present almost daily, worst in the evening and with urination. She denies any abnormal discharge or vaginal bleeding. She is not sexually active. She is using Nexplanon for contraception  No past medical history on file. No past surgical history on file. No family history on file.   Social History   Social History  . Marital status: Married    Spouse name: N/A  . Number of children: N/A  . Years of education: N/A   Occupational History  . Not on file.   Social History Main Topics  . Smoking status: Never Smoker  . Smokeless tobacco: Not on file  . Alcohol use No  . Drug use: No  . Sexual activity: No   Other Topics Concern  . Not on file   Social History Narrative  . No narrative on file   Health Maintenance  Topic Date Due  . TETANUS/TDAP  12/18/2001  . PAP SMEAR  12/19/2003  . INFLUENZA VACCINE  07/18/2016  . HIV Screening  Completed       Review of Systems Pertinent items are noted in HPI.   Objective:  Blood pressure 109/75, pulse 61, weight 122 lb 12.8 oz (55.7 kg), unknown if currently breastfeeding.     GENERAL: Well-developed, well-nourished female in no acute distress.  HEENT: Normocephalic, atraumatic. Sclerae anicteric.  NECK: Supple. Normal thyroid.  LUNGS: Clear to auscultation bilaterally.  HEART: Regular rate and rhythm. BREASTS: Symmetric in size. No palpable masses or lymphadenopathy, skin changes, or nipple drainage. ABDOMEN: Soft, nontender, nondistended. No organomegaly. PELVIC: Normal external female genitalia. Vagina is pink and rugated.  Normal discharge. Normal appearing cervix. Uterus is normal in size. No adnexal mass or tenderness. EXTREMITIES: No cyanosis, clubbing, or edema, 2+ distal pulses.    Assessment:    Healthy female exam.      Plan:    pap smear  with cultures collected Wet prep collected Urine culture  Ultrasound ordered Patient will be contacted with any abnormal results See After Visit Summary for Counseling Recommendations

## 2016-11-08 NOTE — Progress Notes (Signed)
Language Resources Teresa Clark US scheduled for December 1st @ 1500.  Pt notified.

## 2016-11-08 NOTE — Addendum Note (Signed)
Addended by: Garret ReddishBARNES, Alpha Mysliwiec M on: 11/08/2016 05:16 PM   Modules accepted: Orders

## 2016-11-09 LAB — WET PREP, GENITAL
TRICH WET PREP: NONE SEEN
YEAST WET PREP: NONE SEEN

## 2016-11-10 LAB — URINE CULTURE: Organism ID, Bacteria: NO GROWTH

## 2016-11-13 MED ORDER — METRONIDAZOLE 500 MG PO TABS
500.0000 mg | ORAL_TABLET | Freq: Two times a day (BID) | ORAL | 0 refills | Status: DC
Start: 2016-11-13 — End: 2017-01-02

## 2016-11-13 NOTE — Addendum Note (Signed)
Addended by: Catalina AntiguaONSTANT, Cerenity Goshorn on: 11/13/2016 08:23 AM   Modules accepted: Orders

## 2016-11-15 NOTE — Congregational Nurse Program (Signed)
Congregational Nurse Program Note  Date of Encounter: 11/14/2016  Past Medical History: No past medical history on file.  Encounter Details:     CNP Questionnaire - 11/14/16 1000      Patient Demographics   Is this a new or existing patient? New   Patient is considered a/an Refugee   Race Asian     Patient Assistance   Location of Patient Assistance Not Applicable   Patient's financial/insurance status Private Insurance Coverage;Medicaid   Uninsured Patient ([place] Card/Care Connects) No   Patient referred to apply for the following financial assistance Not Applicable   Food insecurities addressed Not Applicable   Transportation assistance Yes   Type of Assistance Bus Pass Given   Assistance securing medications No   Educational health offerings Navigating the healthcare system;Spiritual care     Encounter Details   Primary purpose of visit Navigating the Healthcare System;Spiritual Care/Support Visit   Was an Emergency Department visit averted? Not Applicable   Does patient have a medical provider? Yes   Patient referred to Follow up with established PCP   Was a mental health screening completed? (GAINS tool) No   Does patient have dental issues? No   Does patient have vision issues? No   Does your patient have an abnormal blood pressure today? No   Since previous encounter, have you referred patient for abnormal blood pressure that resulted in a new diagnosis or medication change? No   Does your patient have an abnormal blood glucose today? No   Since previous encounter, have you referred patient for abnormal blood glucose that resulted in a new diagnosis or medication change? No   Was there a life-saving intervention made? No     Office visit for this Burmese lady with visible frustration related to concern regarding scheduled appointment for Transvaginal Ultrasound on 11/17/16 at [facility]. Requested that friend interpret.  Expressed  concern about billing for procedure and contemplating not following through with it. States Medicaid discontinued and unable to confirm insurance or Medicaid coverage. Also requested assistance in [facility]. [facility] on 11-21-16 at 10 am. Return on 11/15/16 to confirm Medicaid and insurance coverage for Ultrasound. Ferol LuzMarietta Adriannah Steinkamp, RN/CN

## 2016-11-16 LAB — CYTOLOGY - PAP
Chlamydia: NEGATIVE
Diagnosis: NEGATIVE
HPV (WINDOPATH): NOT DETECTED
NEISSERIA GONORRHEA: NEGATIVE

## 2016-11-17 ENCOUNTER — Encounter (HOSPITAL_COMMUNITY): Payer: Self-pay

## 2016-11-17 ENCOUNTER — Ambulatory Visit (HOSPITAL_COMMUNITY)
Admission: RE | Admit: 2016-11-17 | Discharge: 2016-11-17 | Disposition: A | Discharge status: Home / Self Care | Payer: BLUE CROSS/BLUE SHIELD | Source: Ambulatory Visit | Attending: Obstetrics and Gynecology | Admitting: Obstetrics and Gynecology

## 2016-11-17 DIAGNOSIS — G8929 Other chronic pain: Secondary | ICD-10-CM | POA: Diagnosis not present

## 2016-11-17 DIAGNOSIS — R102 Pelvic and perineal pain: Secondary | ICD-10-CM | POA: Diagnosis not present

## 2016-11-17 DIAGNOSIS — R1030 Lower abdominal pain, unspecified: Secondary | ICD-10-CM

## 2016-11-24 ENCOUNTER — Telehealth: Payer: Self-pay | Admitting: General Practice

## 2016-11-24 NOTE — Telephone Encounter (Signed)
Per Dr Jolayne Pantheronstant, patient's recent ultrasound is negative and shows no GYN etiology for her pain. Patient does have BV and flagyl has been sent to her pharmacy. This could be the cause of her discomfort. Called patient with Clydie BraunKaren interpreter [phone], patient's husband answered home number stating she wasn't in but would tell her we called. Called patient's mobile number, no answer- left message to call us back for results. Unable to send letter- google translate does not have karen language.

## 2016-11-29 NOTE — Telephone Encounter (Addendum)
Called pt @ home # with [employer] interpreter # [phone] and unable to leave message due to no voice mail. I then called pt's mobile # and left message stating that I am calling with test results and information from the doctor.  There is no emergency and we will call back. I then called pt's pharmacy and was informed that pt has not picked up the medication. The Rx has been saved and will need to be processed once pt has been notified.   12/27  1105  Called pt with [employer] interpreter [phone] and informed her of ultrasound results. She voiced understanding and reports no pain at this time. She also is not having vaginal discharge or irritation and declined medication for BV which was positive on wet prep from 11/22. Pt had no questions.

## 2017-01-02 ENCOUNTER — Encounter (HOSPITAL_COMMUNITY): Payer: Self-pay | Admitting: Emergency Medicine

## 2017-01-02 ENCOUNTER — Ambulatory Visit (HOSPITAL_COMMUNITY)
Admission: EM | Admit: 2017-01-02 | Discharge: 2017-01-02 | Disposition: A | Discharge status: Home / Self Care | Payer: BLUE CROSS/BLUE SHIELD | Attending: Family Medicine | Admitting: Family Medicine

## 2017-01-02 DIAGNOSIS — K529 Noninfective gastroenteritis and colitis, unspecified: Secondary | ICD-10-CM | POA: Diagnosis not present

## 2017-01-02 DIAGNOSIS — A09 Infectious gastroenteritis and colitis, unspecified: Secondary | ICD-10-CM | POA: Diagnosis not present

## 2017-01-02 DIAGNOSIS — R197 Diarrhea, unspecified: Secondary | ICD-10-CM

## 2017-01-02 MED ORDER — ONDANSETRON 4 MG PO TBDP
ORAL_TABLET | ORAL | Status: AC
  Filled 2017-01-02: qty 1

## 2017-01-02 MED ORDER — FAMOTIDINE 20 MG PO TABS
ORAL_TABLET | ORAL | Status: AC
Start: 2017-01-02 — End: 2017-01-02
  Filled 2017-01-02: qty 1

## 2017-01-02 MED ORDER — ONDANSETRON HCL 4 MG PO TABS: 4.0000 mg | ORAL_TABLET | Freq: Four times a day (QID) | ORAL | 0 refills | Status: DC

## 2017-01-02 MED ORDER — GI COCKTAIL ~~LOC~~
30.0000 mL | Freq: Once | ORAL | Status: AC
  Administered 2017-01-02: 30 mL via ORAL

## 2017-01-02 MED ORDER — FAMOTIDINE 20 MG PO TABS
20.0000 mg | ORAL_TABLET | Freq: Once | ORAL | Status: AC
  Administered 2017-01-02: 20 mg via ORAL

## 2017-01-02 MED ORDER — PEDIALYTE PO SOLN: 1000.0000 mL | ORAL | 1 refills | Status: DC

## 2017-01-02 MED ORDER — LOPERAMIDE HCL 2 MG PO CAPS
ORAL_CAPSULE | ORAL | 0 refills | Status: DC
Start: 2017-01-02 — End: 2017-02-01

## 2017-01-02 MED ORDER — GI COCKTAIL ~~LOC~~
ORAL | Status: AC
  Filled 2017-01-02: qty 30

## 2017-01-02 MED ORDER — ONDANSETRON 4 MG PO TBDP
4.0000 mg | ORAL_TABLET | Freq: Once | ORAL | Status: AC
  Administered 2017-01-02: 4 mg via ORAL

## 2017-01-02 NOTE — ED Triage Notes (Addendum)
Pt being assessed using [employer]: [id number]  Pt reports diarrhea, lower back pain and lower abdominal pain for three days. The abdominal pain is the same pain she reported to her PCP in November.  She was put on Flagyl for her symptoms at that time, but states that her MD told her she did not need to take it.  Pt denies any fever or problems with urination.

## 2017-01-02 NOTE — Discharge Instructions (Signed)
Drink clear liquids for the next 24-36 hours. Drink the Pedialyte as described continuously sipping on it. Take Zofran for nausea and vomiting and the Imodium for diarrhea. If you are getting worse, develop fever, see blood in the stool becoming worse in any other way go to the emergency department.

## 2017-01-02 NOTE — ED Provider Notes (Signed)
CSN: [id number]     Arrival date & time 01/02/17  1044 History   First MD Initiated Contact with Patient 01/02/17 1152     Chief Complaint  Patient presents with  . Abdominal Pain  . Back Pain  . Diarrhea   (Consider location/radiation/quality/duration/timing/severity/associated sxs/prior Treatment) 36 year old [place] female accompanied by her husband states that she said diarrhea for 3 days. She is also having epigastric and right upper quadrant abdominal pain to the same amount of time. Today she and last night she has had approximately 20 episodes of watery diarrhea with no bleeding. Yesterday she had 6 episodes. Denies fever, chills or vomiting although she has had nausea. Denies pain in the back is associated with movement. Denies lower abdominal pain. She is taking no medications. Last menstrual period was one week ago and right on time.  Communication through the electronic interpreter. Time room spent 35 minutes.      History reviewed. No pertinent past medical history. History reviewed. No pertinent surgical history. History reviewed. No pertinent family history. Social History  Substance Use Topics  . Smoking status: Never Smoker  . Smokeless tobacco: Never Used  . Alcohol use No   OB History    Gravida Para Term Preterm AB Living   3 3 3     3    SAB TAB Ectopic Multiple Live Births           3      Obstetric Comments   One child deceased around 4 1/2 years of age per patient.       Review of Systems  Constitutional: Positive for activity change. Negative for fever.  HENT: Negative.   Respiratory: Negative.   Cardiovascular: Negative for chest pain and palpitations.  Gastrointestinal: Positive for abdominal pain, diarrhea and nausea. Negative for abdominal distention, blood in stool and vomiting.  Genitourinary: Negative.   Musculoskeletal: Negative.   Neurological: Negative.   All other systems reviewed and are negative.   Allergies  Patient has no known  allergies.  Home Medications   Prior to Admission medications   Medication Sig Start Date End Date Taking? Authorizing Provider  loperamide (IMODIUM) 2 MG capsule Take 1 tab now, 1 tab in 6 hours and if having more than 5 stools in 6 hours take 1 more. Do not take more than 3 in a day. 01/02/17   Hayden Rasmussen, NP  ondansetron (ZOFRAN) 4 MG tablet Take 1 tablet (4 mg total) by mouth every 6 (six) hours. [prn nausea 01/02/17   Hayden Rasmussen, NP  PEDIALYTE (PEDIALYTE) SOLN Take 1,000 mLs by mouth continuous. 01/02/17   Hayden Rasmussen, NP   Meds Ordered and Administered this Visit   Medications  gi cocktail (Maalox,Lidocaine,Donnatal) (not administered)  famotidine (PEPCID) tablet 20 mg (not administered)  ondansetron (ZOFRAN-ODT) disintegrating tablet 4 mg (not administered)    BP 116/78 (BP Location: Left Arm)   Pulse (!) 59   Temp 98.1 F (36.7 C) (Oral)   LMP 12/26/2016 (Approximate)   SpO2 99%  No data found.   Physical Exam  Constitutional: She appears well-developed and well-nourished. No distress.  Eyes: EOM are normal.  Neck: Normal range of motion. Neck supple.  Cardiovascular: Normal rate, regular rhythm, normal heart sounds and intact distal pulses.   Apical rate 72.  Pulmonary/Chest: Effort normal and breath sounds normal. No respiratory distress.  Abdominal: Soft. Bowel sounds are normal. She exhibits no distension.  Mild tenderness to the epigastrium and far right upper quadrant. No tenderness across  the midline or lower abdomen. No masses or guarding. No rebound.  Musculoskeletal: Normal range of motion. She exhibits no edema.  Lymphadenopathy:    She has no cervical adenopathy.  Neurological: She is alert. No cranial nerve deficit.  Skin: Skin is warm and dry.  Nursing note and vitals reviewed.   Urgent Care Course   Clinical Course     Procedures (including critical care time)  Labs Review Labs Reviewed - No data to display  Imaging Review No results  found.   Visual Acuity Review  Right Eye Distance:   Left Eye Distance:   Bilateral Distance:    Right Eye Near:   Left Eye Near:    Bilateral Near:         MDM   1. Gastroenteritis   2. Diarrhea of presumed infectious origin    Drink clear liquids for the next 24-36 hours. Drink the Pedialyte as described continuously sipping on it. Take Zofran for nausea and vomiting and the Imodium for diarrhea. If you are getting worse, develop fever, see blood in the stool becoming worse in any other way go to the emergency department. Meds ordered this encounter  Medications  . gi cocktail (Maalox,Lidocaine,Donnatal)  . famotidine (PEPCID) tablet 20 mg  . ondansetron (ZOFRAN-ODT) disintegrating tablet 4 mg  . ondansetron (ZOFRAN) 4 MG tablet    Sig: Take 1 tablet (4 mg total) by mouth every 6 (six) hours. [prn nausea    Dispense:  12 tablet    Refill:  0    Order Specific Question:   Supervising Provider    Answer:   Linna HoffKINDL, JAMES D [phone]]  . loperamide (IMODIUM) 2 MG capsule    Sig: Take 1 tab now, 1 tab in 6 hours and if having more than 5 stools in 6 hours take 1 more. Do not take more than 3 in a day.    Dispense:  12 capsule    Refill:  0    Order Specific Question:   Supervising Provider    Answer:   Linna HoffKINDL, JAMES D [phone][[id number]]  . PEDIALYTE (PEDIALYTE) SOLN    Sig: Take 1,000 mLs by mouth continuous.    Dispense:  1000 mL    Refill:  1    Order Specific Question:   Supervising Provider    Answer:   Linna HoffKINDL, JAMES D [[id number]]   Due to language barrier and difficulty in understanding pharmacy directions and phone numbers the prescriptions were printed and handed to the patient. They also are advised to continue to try to contact their case worker to help him with obtaining a primary care provider and for cystic with him with these instructions.    Hayden Rasmussenavid Angeligue Bowne, NP 01/02/17 1242

## 2017-02-01 ENCOUNTER — Inpatient Hospital Stay (HOSPITAL_COMMUNITY)
Admission: AD | Admit: 2017-02-01 | Discharge: 2017-02-01 | Disposition: A | Discharge status: Home / Self Care | Payer: BLUE CROSS/BLUE SHIELD | Source: Ambulatory Visit | Attending: Obstetrics & Gynecology | Admitting: Obstetrics & Gynecology

## 2017-02-01 ENCOUNTER — Encounter (HOSPITAL_COMMUNITY): Payer: Self-pay | Admitting: *Deleted

## 2017-02-01 DIAGNOSIS — Z603 Acculturation difficulty: Secondary | ICD-10-CM

## 2017-02-01 DIAGNOSIS — R1013 Epigastric pain: Secondary | ICD-10-CM | POA: Diagnosis not present

## 2017-02-01 DIAGNOSIS — Z789 Other specified health status: Secondary | ICD-10-CM

## 2017-02-01 DIAGNOSIS — M545 Low back pain: Secondary | ICD-10-CM | POA: Diagnosis not present

## 2017-02-01 DIAGNOSIS — K529 Noninfective gastroenteritis and colitis, unspecified: Secondary | ICD-10-CM | POA: Diagnosis not present

## 2017-02-01 DIAGNOSIS — G8929 Other chronic pain: Secondary | ICD-10-CM | POA: Diagnosis not present

## 2017-02-01 DIAGNOSIS — R197 Diarrhea, unspecified: Secondary | ICD-10-CM

## 2017-02-01 LAB — URINALYSIS, ROUTINE W REFLEX MICROSCOPIC
BILIRUBIN URINE: NEGATIVE
HGB URINE DIPSTICK: NEGATIVE
KETONES UR: NEGATIVE mg/dL
NITRITE: NEGATIVE
Protein, ur: NEGATIVE mg/dL
Specific Gravity, Urine: 1 — ABNORMAL LOW (ref 1.005–1.030)
pH: 7 (ref 5.0–8.0)

## 2017-02-01 LAB — COMPREHENSIVE METABOLIC PANEL
ALT: 18 U/L (ref 14–54)
ANION GAP: 6 (ref 5–15)
AST: 17 U/L (ref 15–41)
Albumin: 4 g/dL (ref 3.5–5.0)
Alkaline Phosphatase: 42 U/L (ref 38–126)
BILIRUBIN TOTAL: 0.5 mg/dL (ref 0.3–1.2)
BUN: 6 mg/dL (ref 6–20)
CALCIUM: 8.8 mg/dL — AB (ref 8.9–10.3)
CO2: 25 mmol/L (ref 22–32)
Chloride: 106 mmol/L (ref 101–111)
Creatinine, Ser: 0.72 mg/dL (ref 0.44–1.00)
GFR calc non Af Amer: >60 mL/min (ref >60)
Glucose, Bld: 120 mg/dL — ABNORMAL HIGH (ref 65–99)
POTASSIUM: 3.2 mmol/L — AB (ref 3.5–5.1)
Sodium: 137 mmol/L (ref 135–145)
TOTAL PROTEIN: 7.2 g/dL (ref 6.5–8.1)

## 2017-02-01 LAB — CBC
HEMATOCRIT: 35.8 % — AB (ref 36.0–46.0)
HEMOGLOBIN: 12.3 g/dL (ref 12.0–15.0)
MCH: 32 pg (ref 26.0–34.0)
MCHC: 34.4 g/dL (ref 30.0–36.0)
MCV: 93.2 fL (ref 78.0–100.0)
Platelets: 256 10*3/uL (ref 150–400)
RBC: 3.84 MIL/uL — ABNORMAL LOW (ref 3.87–5.11)
RDW: 12.3 % (ref 11.5–15.5)
WBC: 10.4 10*3/uL (ref 4.0–10.5)

## 2017-02-01 LAB — POCT PREGNANCY, URINE: Preg Test, Ur: NEGATIVE

## 2017-02-01 MED ORDER — LOPERAMIDE HCL 2 MG PO CAPS: 2.0000 mg | ORAL_CAPSULE | Freq: Four times a day (QID) | ORAL | 3 refills | Status: DC | PRN

## 2017-02-01 MED ORDER — GI COCKTAIL ~~LOC~~
30.0000 mL | Freq: Once | ORAL | Status: AC
  Administered 2017-02-01: 30 mL via ORAL
  Filled 2017-02-01: qty 30

## 2017-02-01 MED ORDER — ONDANSETRON 4 MG PO TBDP
4.0000 mg | ORAL_TABLET | Freq: Once | ORAL | Status: AC
  Administered 2017-02-01: 4 mg via ORAL
  Filled 2017-02-01: qty 1

## 2017-02-01 NOTE — Progress Notes (Signed)
Pt dis not understand [employer] [employer].  A [place] interpretor was used with husband to help translate for the patient.

## 2017-02-01 NOTE — Discharge Instructions (Signed)
Stool Culture Why am I having this test? A stool culture tests your stool (feces) for infections of the intestines that are caused by bacteria, a virus, or parasites. Your health care provider may order this test if you have a fever, abdominal pain, and diarrhea that lasts for more than a few days. What kind of sample is taken? A stool sample is required for this test. You will collect the sample when you have a bowel movement. How do I collect samples at home? You will collect a sample of your stool at home. Your health care provider will give you instructions. Carefully follow them each time you collect a sample. Your health care provider will also give you all of the supplies that you need. For each sample that you collect, you may get:  A small container. You may be given different colored containers. Each container may come with different instructions.  Gloves that can be thrown away.  A plastic bag. When collecting the sample:  Do not pour out the fluid that is in the container. This fluid will preserve your sample.  Choose the parts of the sample that are bloody, slimy, or watery.  If your stool is hard, choose samples from each end and the middle.  Do not mix urine, toilet paper, or water with your sample. You may be instructed to collect the sample in the following way:  Before you collect the sample:  Cover the toilet bowl with plastic wrap or a plastic bag.  Tape the wrap or bag to the bowl of the toilet, not to the seat. Do not stretch the plastic tight across the bowl. Leave room for your stool to fall during your bowel movement.  Wash and dry any containers that you were given. Keep them in the bathroom.  When you are ready to collect a sample:  Wash your hands. thoroughly with soap and water.  Put on the gloves that were provided to you.  Using the small shovel that is built into the top of the container, put small scoops of your stool into the container. Fill the  container up to the red line on the label.  Use the shovel to stir the stool sample into the liquid in the container if directed to do so by the instructions that came with the collection container.  Close the lid of the collection container tightly.  Shake the sample until it is well mixed.  On the label, write the date, time, and your initials.  Put the container in the plastic bag that was given to you.  Flush the rest of your stool down the toilet. Throw away the gloves.  Wash your hands thoroughly with soap and water.  Store the sample using the instructions on the container that you used to collect the sample.  Repeat this procedure at a later time if you need to collect another sample. Additional samples should be collected at different times.  Return the sample or samples to your health care provider shortly after you collect them. Check the instructions about when they need to be returned. How do I prepare for this test? If you are a woman and are menstruating, wait 3 days after the end of your menstrual cycle before you collect a sample. What do the results mean? It is your responsibility to obtain your test results. Ask the lab or department performing the test when and how you will get your results. Normal results include:  Normal intestinal flora. This means  that the bacteria and fungi that are present are typically found in your intestines and are present in normal amounts.  No ova or parasite infestation. This means that there is no evidence of parasites in your intestines. Abnormal results will specify the bacteria, virus, or parasite that is responsible for the infection. Talk with your health care provider to discuss your results, treatment options, and if necessary, the need for more tests. Talk with your health care provider if you have any questions about your results. Talk with your health care provider to discuss your results, treatment options, and if necessary,  the need for more tests. Talk with your health care provider if you have any questions about your results. This information is not intended to replace advice given to you by your health care provider. Make sure you discuss any questions you have with your health care provider. Document Released: 01/06/2011 Document Revised: 08/02/2016 Document Reviewed: 04/24/2014 Elsevier Interactive Patient Education  2017 Elsevier Inc. Chronic Diarrhea Diarrhea is frequent loose and watery bowel movements. It can cause you to feel weak and dehydrated. Dehydration can cause you to become tired and thirsty and to have a dry mouth, decreased urination, and dark yellow urine. Diarrhea is a sign of another problem, most often an infection that will not last long. In most cases, diarrhea lasts 2-3 days. Diarrhea that lasts longer than 4 weeks is called long-lasting (chronic) diarrhea. It is important to treat your diarrhea as directed by your health care provider to lessen or prevent future episodes of diarrhea.  CAUSES  There are many causes of chronic diarrhea. The following are some possible causes:   Gastrointestinal infections caused by viruses, bacteria, or parasites.   Food poisoning or food allergies.   Certain medicines, such as antibiotics, chemotherapy, and laxatives.   Artificial sweeteners and fructose.   Digestive disorders, such as celiac disease and inflammatory bowel diseases.   Irritable bowel syndrome.  Some disorders of the pancreas.  Disorders of the thyroid.  Reduced blood flow to the intestines.  Cancer. Sometimes the cause of chronic diarrhea is unknown. RISK FACTORS  Having a severely weakened immune system, such as from HIV or AIDS.   Taking certain types of cancer-fighting drugs (such as with chemotherapy) or other medicines.   Having had a recent organ transplant.   Having a portion of the stomach or small bowel removed.   Traveling to countries where food and  water supplies are often contaminated.  SYMPTOMS  In addition to frequent, loose stools, diarrhea may cause:   Cramping.   Abdominal pain.   Nausea.   Fever.  Fatigue.  Urgent need to use the bathroom.  Loss of bowel control. DIAGNOSIS  Your health care provider must take a careful history and perform a physical exam. Tests given are based on your symptoms and history. Tests may include:   Blood or stool tests. Three or more stool samples may be examined. Stool cultures may be used to test for bacteria or parasites.   X-rays.   A procedure in which a thin tube is inserted into the mouth or rectum (endoscopy). This allows the health care provider to look inside the intestine.  TREATMENT   Treatment is aimed at correcting the cause of the diarrhea when possible.  Diarrhea caused by an infection can often be treated with antibiotic medicines.  Diarrhea not caused by an infection may require you to take long-term medicine or have surgery. Specific treatment should be discussed with your health  care provider.  If the cause cannot be determined, treatment aims to relieve symptoms and prevent dehydration. Serious health problems can occur if you do not maintain proper fluid levels. Treatment may include:  Taking an oral rehydration solution (ORS).  Not drinking beverages that contain caffeine (such as tea, coffee, and soft drinks).  Not drinking alcohol.  Maintaining well-balanced nutrition to help you recover faster. HOME CARE INSTRUCTIONS   Drink enough fluids to keep urine clear or pale yellow. Drink 1 cup (8 oz) of fluid for each diarrhea episode. Avoid fluids that contain simple sugars, fruit juices, whole milk products, and sodas. Hydrate with an ORS. You may purchase the ORS or prepare it at home by mixing the following ingredients together:  ?-? tsp (1.7-3 ? mL) table salt.   tsp (3  mL) baking soda.  ? tsp (1.7 mL) salt substitute containing potassium  chloride.  1 ? tbsp (20 mL) sugar.  4.2 c (1 L) of water.   Certain foods and beverages may increase the speed at which food moves through the gastrointestinal (GI) tract. These foods and beverages should be avoided. They include:  Caffeinated and alcoholic beverages.  High-fiber foods, such as raw fruits and vegetables, nuts, seeds, and whole grain breads and cereals.  Foods and beverages sweetened with sugar alcohols, such as xylitol, sorbitol, and mannitol.   Some foods may be well tolerated and may help thicken stool. These include:  Starchy foods, such as rice, toast, pasta, low-sugar cereal, oatmeal, grits, baked potatoes, crackers, and bagels.  Bananas.  Applesauce.  Add probiotic-rich foods to help increase healthy bacteria in the GI tract. These include yogurt and fermented milk products.  Wash your hands well after each diarrhea episode.  Only take over-the-counter or prescription medicines as directed by your health care provider.  Take a warm bath to relieve any burning or pain from frequent diarrhea episodes. SEEK MEDICAL CARE IF:   You are not urinating as often.  Your urine is a dark color.  You become very tired or dizzy.  You have severe pain in the abdomen or rectum.  Your have blood or pus in your stools.  Your stools look black and tarry. SEEK IMMEDIATE MEDICAL CARE IF:   You are unable to keep fluids down.  You have persistent vomiting.  You have blood in your stool.  Your stools are black and tarry.  You do not urinate in 6-8 hours, or there is only a small amount of very dark urine.  You have abdominal pain that increases or localizes.  You have weakness, dizziness, confusion, or lightheadedness.  You have a severe headache.  Your diarrhea gets worse or does not get better.  You have a fever or persistent symptoms for more than 2-3 days.  You have a fever and your symptoms suddenly get worse. MAKE SURE YOU:   Understand these  instructions.  Will watch your condition.  Will get help right away if you are not doing well or get worse. This information is not intended to replace advice given to you by your health care provider. Make sure you discuss any questions you have with your health care provider. Document Released: 02/24/2004 Document Revised: 12/09/2013 Document Reviewed: 05/29/2013 Elsevier Interactive Patient Education  2017 [employer]. [facility] Guide (Revised August 2014)   Chronic Pain Problems:   [place]:  [phone]           Patients need to be referred by their  primary care doctor/specialist  Insufficient Money for Medicine:           United Way: call "211"    MAP Program at [facility] Department - [place] [phone] or [place] [phone]            No Primary Care Doctor:  To locate a primary care doctor that accepts your insurance or provides certain services:           [place] Connect: [phone]           Physician Referral Service: [phone] ask for My [place]  If no insurance, you need to see if you qualify for [facility] orange card, call to set      up appointment for eligibility/enrollment at [phone] or [phone] or visit [facility]. of Health and [employer] ([address] [place], [place] and [address] -[place]) to meet with a [facility] enrollment specialist.  Agencies that provide inexpensive (sliding fee scale) medical care:       [facility] - Family Medicine at [place] - [phone]     [facility]  -  [facility] Adult Center [phone]     [facility] Internal Medicine - [phone]     [facility] Care & Wellness - [phone]     [facility] [phone]     [facility] Family Practice [phone]  [facility] - [facility] Child Health @ [place] [phone]  [facility]  [facility]icine - [facility] Health @ [place] - [phone]  [facility] Family Practice: [phone]   Women's Clinic: [phone]   Planned Parenthood: [phone]   [facility] [place]    Medicaid-accepting Orthopaedic Ambulatory Surgical Intervention Services Providers:           [facility] [phone] (No Family Planning accepted)          [address], [phone], Mon-Fri 9am-5pm          [facility] - [phone]  [address] [place], [address] [place], Mon-Thursday 8am-5pm, Fri 8am-noon  [employer] - [phone]          [address], Mon-Fri 7:30am-4:30pm          [facility] - [phone]          [address] [place] 8am-5pm          [facility] - [phone] [address]          Only accepts [place] [employer] patients after they have their name applied to their card  Self Pay (no insurance) in [place] Ophthalmic Outpatient Surgery:           Sickle Cell Patients:   [address] [place], [phone] [facility] Internal Medicine:  [address], [place] [phone]       [facility] and Wellness  [address] [place] [phone]  [facility] Family Practice:  [address] [phone]          [facility] Urgent Care           [address] [place], [phone] [facility]  [address] [place], [phone]  [facility] Urgent Care [place]           [address], [place] [phone]        [facility] - [address]           [phone], Mon-Fri 9am-7pm, [place] 9am-1pm          [facility] - Family Medicine @ [facility]          [address] [place], [phone]          [facility] - [facility]           [address] [phone] [facility] -  [facility]  [address] [place] [phone]          [facility]           [address], [phone]  [facility] - [facility]   [address] [place], [place] [phone] [facility] - [facility]  [address] [phone]  Dr. Julio Sicks           [address], [place], [phone]          [facility] Urgent Care           [address], [phone]          [facility]             [address] [phone]          [facility] [place]           [address] [place], [phone], 1st & 3rd Saturday every month, 10am-1pm  OTHERS:  [employer]  (Immigration [employer] Only)  [phone] (Thursday only)  Strategies for finding a Primary Care Provider:  1) Find a Doctor and Pay Out of Pocket  Although you won't have to find out who is covered by your insurance plan, it is a good idea to ask around and get recommendations. You will then need to call the office and see if the doctor you have chosen will accept you as a new patient and what types of options they offer for patients who are self-pay. Some doctors offer discounts or will set up payment plans for their patients who do not have insurance, but you will need to ask so you aren't surprised when you get to your appointment.  2) Contact [place] [employer] - To see if you qualify for orange card access to healthcare safety net providers.  Call for appointment for eligibility/enrollment at [phone] or [phone]. (Uninsured, 0-200% FPL, qualifying info)  Applicants for [facility] are first required to see if they are eligible to enroll in the [facility] Marketplace before enrolling in [facility] (and get an exemption if they are not).  GCCN Criteria for acceptance is:  ? Proof of ACA Marketing exemption - form or documentation  ? Valid photo ID (driver's  license, state identification card, passport, home country ID)  ? Proof of [facility] residency (e.g. drivers license, lease/landlord information, pay stubs with address, utility bill, bank statement, etc.)  ? Proof of income (1040, last year's tax return, W2, 4 current pay stubs, other income proof)  ? Proof of assets (current bank statement + 3 most recent, disability paperwork, life insurance info, tax  value on autos, etc.)  3) Contact Your Local Health Department  Not all health departments have doctors that can see patients for sick visits, but many do, so it is worth a call to see if yours does. If you don't know where your local health department is, you can check in your phone book. The CDC also has a tool to help you locate your state's health department, and many state websites also have listings of all of their local health departments.  4) Find a Walk-in Clinic  If your illness is not likely to be very severe or complicated, you may want to try a walk in clinic. These are popping up all over the country in pharmacies, drugstores, and shopping centers. They're usually staffed by nurse practitioners or physician assistants that have been trained to treat common illnesses and complaints. They're usually fairly quick and inexpensive. However, if you have serious medical issues or chronic medical problems, these are probably not your best option   STD Testing:           [facility], [facility]           [address], [place], phone [phone] or [phone]           Monday - Friday, call for an appointment          [facility] Department of [facility], [place] [facility]           [address], [place], phone [phone] or [phone]           Monday - Friday, call for an appointment Abuse/Neglect:           [facility] Child Abuse Hotline: (506)406-8416           [facility] Child Abuse Hotline:  205-269-9919 (After Hours)  Emergency Shelter:  [facility] (346)127-3034  [facility] [place]- [phone]  [facility] [place] - [phone]  Youth Focus - Act Together - [phone] (ages 4-17)  Homeless Day Shelter @ [employer] - [phone]   Mammograms - Free at [facility] [phone]  Maternity Homes:           Room at the [place] [facility]: [phone]   (Homeless mother with children)          [place] Alert Services: [phone] (Mothers only)  Youth Focus: [phone] (Pregnant 59-33 years old)  Adopt a Mom [phone]  [facility]   [facility] - Lanae Boast  [address] [place] [address], [place] [phone]          [facility]           [address] [place]           [phone]          [place]           [address] [place], [place]           [phone]          [facility] Dept.           [address], [place]           [phone]          [facility] Mental Health           [phone]  [facility] - [facility]           [phone]          [facility] in [place]           [address]           [phone], [facility]urance          [place] Child Abuse Hotline           580-774-6132(336) (248) 797-0911           (819)335-4788(336) (705)[phone] (After Hours)  Behavioral Health Services /Substance Abuse Resources:           Alcohol and Drug Services: [phone]           [facility]: [phone]          [facility]: [phone](336) [phone]   Narcotics Helpline - (209) [phone]          [facility]: 734-629-0930(336) [phone]           Residential & Outpatient Substance Abuse Program - [facility] [place]: ([phone] [phone]  [employer]  Behavioral Health and Wellness Center - [phone]([phone] Psychological Services:          [facility]:  [phone]   Therapeutic Alternatives: [phone]          [facility] Mental Health           [address], [place]           ACCESS LINE: [phone]     (24 Hour)  Mobile Crisis:   HELPLINES:  Financial risk analystational Alliance on Mental Illness - [place] [phone]) [phone] [facility] - [place] (604) [phone](800) [phone]  Walk In [facility] Services       [facility] - [address] - [place]  [phone]       [facility] - [phone] or [phone](336) [phone]  [facility] - [phone] S[address] - [place] [phone]  [facility] [phone]- [address], [place] [phone](336) [phone]   Dental Assistance:  If unable to pay or uninsured, contact: [facility] to become qualified for the adult dental clinic. Patient must be enrolled in [facility] (uninsured, 0-200% FPL, qualifying info).  Enroll in [facility] first, then see Primary Care Physician assigned to you, the PCP makes a dental referral. [place] [facility] will receive referral and contacts patient for appointment.  Patients with Medicaid           [address], [phone]  [facility] [facility] (Children up to 20 + Pregnant Women) - [phone]) [phone]  [facility] - [address] - [phone]  If unable to pay, or uninsured: contact [facility] [phone] in [place] - (Children only + Pregnant Women), [phone] in [facility]- Children only) to become qualified for the adult dental clinic  Must see if eligible to enroll in [facility] Insurance Marketplace before enrolling into the [facility] (exemption required) [phone] for an appointment)  [email or web address];   [phone].  If not eligible for ACA, then go by Department of Health and Human Services to see if eligible for orange card.  [address], [place] and [phone]- [address] [place].  Once you get an orange card, you will have a  Primary Care home who will then refer you to dental if needed.        Other ProofreaderLow-Cost Community Dental  Services:   [facility] [phone] (ext [phone])   [address]  Dr. Lawrence [place] - [phone]   [address]    [place] - [phone]   [facility]           [address] [place], [place], [place], [address]           [phone], Ext. [phone]           2nd and 4th Thursday of the month at 6:30am (Simple extractions only - no wisdom teeth or surgery) First come/First serve -First 10 clients served           [facility] [place], [place] and [place] residents only)          [address] [place], [place], [place], [address]           [phone]                    [facility] Department           [phone]          [facility] Health Department          [phone]         [facility] - [facility]          [phone]   Transportation Options:  Ambulance - 911 - $250-$700 per ride Family Member to accompany patient (if stable) [place] [employer] - [phone]  PART - [phone]  Taxi - [phone] - [place]  [facility] - [phone] (Application required)  [facility] - [phone]

## 2017-02-01 NOTE — MAU Note (Signed)
Urine in lab 

## 2017-02-01 NOTE — MAU Note (Signed)
Plan of care, medications discussed with pt using Clydie BraunKaren interpreter.  Pt crying, but states she understands.

## 2017-02-01 NOTE — MAU Note (Signed)
Pt c/o abd pain and n/v /d x 1 day . Feeling hot and sweaty. Vomited once today and had diarrhea 3-4  Today.

## 2017-02-01 NOTE — MAU Provider Note (Signed)
Chief Complaint:  Abdominal Pain and Diarrhea   First Provider Initiated Contact with Patient 02/01/17 1817       HPI: Teresa Clark is a 36 y.o. [id number] who presents to maternity admissions reporting epigastric pain.  Tearful.  We think she is also stating she has vomiting and diarrhea.  No interpretor present, and electronic interpretor cannot find the right dialect.  No family with her. . She reports no vaginal bleeding, vaginal itching/burning, urinary symptoms, h/a, dizziness, n/v, or fever/chills.    Looks like she was worked up in clinic for GYN possible causes of pain in November.  Workup including [place] was negative.  RN tells me she thinks the patient is under the impression that she is pregnant. She was seen in Urgent Care in January for diarrhea and epigastric pain, but no labs were done. Looks like she was given GI cocktail, zofran and Pepcid.  They prescribed Immodium.  Marland Kitchen   Abdominal Pain  This is a recurrent problem. The current episode started yesterday. The problem occurs constantly. The pain is located in the epigastric region. The quality of the pain is sharp. Associated symptoms include diarrhea, nausea and vomiting. Pertinent negatives include no constipation, fever or myalgias.  Diarrhea   This is a recurrent problem. The current episode started yesterday. The problem occurs 2 to 4 times per day. Associated symptoms include abdominal pain and vomiting. Pertinent negatives include no chills, fever or myalgias. Risk factors include ill contacts.   RN Note: Pt c/o abd pain and n/v /d x 1 day . Feeling hot and sweaty. Vomited once today and had diarrhea 3-4  Today.  Past Medical History: Past Medical History:  Diagnosis Date  . Medical history non-contributory     Past obstetric history: OB History  Gravida Para Term Preterm AB Living  3 3 3     3   SAB TAB Ectopic Multiple Live Births          3    # Outcome Date GA Lbr Len/2nd Weight Sex Delivery Anes PTL Lv  3 Term  01/15/14 [redacted]w[redacted]d  6 lb 15.6 oz (3.165 kg) F Vag-Spont None  LIV     Birth Comments: None noted  2 Term 2011 [redacted]w[redacted]d 09:00 7 lb (3.175 kg) F Vag-Spont   LIV     Birth Comments: Mylasia  1 Term 2006 [redacted]w[redacted]d 03:00  M Vag-Spont None  LIV     Birth Comments: [place]    Obstetric Comments  One child deceased around 95 1/2 years of age per patient.      Past Surgical History: Past Surgical History:  Procedure Laterality Date  . NO PAST SURGERIES      Family History: No family history on file.  Social History: Social History  Substance Use Topics  . Smoking status: Never Smoker  . Smokeless tobacco: Never Used  . Alcohol use No    Allergies: No Known Allergies  Meds:  Prescriptions Prior to Admission  Medication Sig Dispense Refill Last Dose  . loperamide (IMODIUM) 2 MG capsule Take 1 tab now, 1 tab in 6 hours and if having more than 5 stools in 6 hours take 1 more. Do not take more than 3 in a day. 12 capsule 0   . ondansetron (ZOFRAN) 4 MG tablet Take 1 tablet (4 mg total) by mouth every 6 (six) hours. [prn nausea 12 tablet 0   . PEDIALYTE (PEDIALYTE) SOLN Take 1,000 mLs by mouth continuous. 1000 mL 1  I have reviewed patient's Past Medical Hx, Surgical Hx, Family Hx, Social Hx, medications and allergies.  ROS:  Review of Systems  Constitutional: Negative for chills and fever.  Respiratory: Negative for shortness of breath.   Cardiovascular: Negative for leg swelling.  Gastrointestinal: Positive for abdominal pain, diarrhea, nausea and vomiting. Negative for constipation.  Genitourinary: Negative for vaginal bleeding.  Musculoskeletal: Negative for myalgias.   Other systems negative     Physical Exam  Patient Vitals for the past 24 hrs:  BP Temp Pulse Resp  02/01/17 1803 104/73 99.6 F (37.6 C) (!) 56 18   Constitutional: Well-developed, well-nourished female in no acute distress.  Cardiovascular: normal rate and rhythm, no ectopy audible, S1 & S2 heard, no  murmur Respiratory: normal effort, no distress. Lungs CTAB with no wheezes or crackles GI: Abd soft, non-tender except over epigastrum.  Nondistended.  No rebound, No guarding.  Bowel Sounds audible  MS: Extremities nontender, no edema, normal ROM Neurologic: Alert and oriented x 4.   Grossly nonfocal. GU: Neg CVAT. Skin:  Warm and Dry Psych:  Affect appropriate.  PELVIC EXAM: deferred    Labs: Results for orders placed or performed during the hospital encounter of 02/01/17 (from the past 24 hour(s))  Pregnancy, urine POC     Status: None   Collection Time: 02/01/17  5:23 PM  Result Value Ref Range   Preg Test, Ur NEGATIVE NEGATIVE  CBC     Status: Abnormal   Collection Time: 02/01/17  6:41 PM  Result Value Ref Range   WBC 10.4 4.0 - 10.5 K/uL   RBC 3.84 (L) 3.87 - 5.11 MIL/uL   Hemoglobin 12.3 12.0 - 15.0 g/dL   HCT 95.635.8 (L) 38.736.0 - 56.446.0 %   MCV 93.2 78.0 - 100.0 fL   MCH 32.0 26.0 - 34.0 pg   MCHC 34.4 30.0 - 36.0 g/dL   RDW 33.212.3 95.111.5 - 88.415.5 %   Platelets 256 150 - 400 K/uL      Imaging:  No results found.  MAU Course/MDM: I have ordered labs as follows:  CBC and CMET, both normal with no acute findings Imaging ordered: none Exceedingly difficult care due to language problems and inability to get interpretor in her dialect.  Finally, husband arrived and translated to [place], then [place]ese translator helped us.    Discussed this is not a GYN problem This is a digestive problem.  May be infectious diarrhea or some other issue. Needs family doctor and GI referral .  Tried to get stool sample but she could not go. Sent home with supplies.  To bring back to clinic. Message sent to clinic to try to arrange for GI referral since it is now after hours.   Recommend continue Immodium for diarrhea.   Treatments in MAU included GI cocktail with some relief of epigastric pain. Zofran given first for nausea. .   Pt stable at time of discharge.  Assessment: Chronic diarrhea  of unknown origin  Abdominal pain, epigastric  Chronic midline low back pain without sciatica - Only hurts when she has diarrrhea  Diarrhea, unspecified type  Language barrier, cultural differences - Plan: Discharge patient   Plan: Discharge home Recommend referral to GI specialist and also I will order outpatient Stool studies Need to use Burmese interpretor if husband available, then he can speak Po to patient.  Encouraged to return here or to other Urgent Care/ED if she develops worsening of symptoms, increase in pain, fever, or other concerning symptoms.  Wynelle Bourgeois CNM, MSN Certified Nurse-Midwife 02/01/2017 6:57 PM

## 2017-02-03 ENCOUNTER — Other Ambulatory Visit: Payer: Self-pay | Admitting: Advanced Practice Midwife

## 2017-02-03 DIAGNOSIS — R197 Diarrhea, unspecified: Secondary | ICD-10-CM

## 2017-02-03 NOTE — Progress Notes (Unsigned)
Instructed to bring stool to clinic for culture Brought to MAU Orders placed Teresa SignsMarie Clark Eulla Kochanowski, CNM

## 2017-02-07 ENCOUNTER — Telehealth: Payer: Self-pay | Admitting: *Deleted

## 2017-02-07 ENCOUNTER — Encounter: Payer: Self-pay | Admitting: Physician Assistant

## 2017-02-07 DIAGNOSIS — K529 Noninfective gastroenteritis and colitis, unspecified: Secondary | ICD-10-CM

## 2017-02-07 DIAGNOSIS — R109 Unspecified abdominal pain: Secondary | ICD-10-CM

## 2017-02-07 NOTE — Telephone Encounter (Signed)
Per Wynelle BourgeoisMarie Williams, CNM needs referral to GI for abdominal pain and chronic diarrhea. Was seen in MAU. Speaks Burmese dialect called pao. Is supposed to bring a stool specimen to office and we should send it for stool culture, ova and parasites, c. Diff. Also needs referral to primary care doctor.

## 2017-02-07 NOTE — Telephone Encounter (Signed)
Called Callyn with pacifica interpreter [phone] -no pao interpreter available - used karen interpereter at her home number and was told not there and given her mobile number. Called her mobile number and left a message we are calling with some information- please call our office.

## 2017-02-07 NOTE — Telephone Encounter (Addendum)
I called [facility] and made appointment for 02/15/17 11:15. They will schedule an interpreter. They will mail her some paperwork that she should bring with her to her appointment.  When we call patient to tell her of gi appt need to find out location she prefers for [place] [facility] and then can schedule that.

## 2017-02-12 NOTE — Telephone Encounter (Addendum)
Called Teresa Clark with [employer] interpreter- karen-but doesn not understand - was transferred to burmese interpreter [phone] and she did understand, Notified her of referral appointment  And that she would receive paperwork to complete and take with her. She then states she doesn't have insurance anymore- I informed her she would be required to pay at least $200 at the appointment and billed for the rest. She states she is buying some insurance. I instructed her to take the insurance card. She asked if she has to go to the appointment. I informed her it is her choice but if she is still having abdominal pain at times or chronic diarrrhea I would recommend her to keep appointment, but it is her choice.

## 2017-02-13 ENCOUNTER — Ambulatory Visit (HOSPITAL_COMMUNITY)
Admission: EM | Admit: 2017-02-13 | Discharge: 2017-02-13 | Disposition: A | Discharge status: Home / Self Care | Payer: BLUE CROSS/BLUE SHIELD | Attending: Family Medicine | Admitting: Family Medicine

## 2017-02-13 ENCOUNTER — Encounter (HOSPITAL_COMMUNITY): Payer: Self-pay | Admitting: Family Medicine

## 2017-02-13 DIAGNOSIS — A040 Enteropathogenic Escherichia coli infection: Secondary | ICD-10-CM | POA: Insufficient documentation

## 2017-02-13 DIAGNOSIS — R197 Diarrhea, unspecified: Secondary | ICD-10-CM | POA: Diagnosis not present

## 2017-02-13 DIAGNOSIS — R109 Unspecified abdominal pain: Secondary | ICD-10-CM | POA: Diagnosis present

## 2017-02-13 MED ORDER — HYOSCYAMINE SULFATE SL 0.125 MG SL SUBL: 1.0000 | SUBLINGUAL_TABLET | Freq: Three times a day (TID) | SUBLINGUAL | 1 refills | Status: DC | PRN

## 2017-02-13 NOTE — ED Notes (Signed)
Patient in bathroom, attempting stool specimen.  Instructions provided with assistance from translator service

## 2017-02-13 NOTE — Discharge Instructions (Signed)
Please call your doctor to be seen either Thursday or Friday

## 2017-02-13 NOTE — ED Provider Notes (Signed)
[facility]    CSN: [id number] Arrival date & time: 02/13/17  1857     History   Chief Complaint Chief Complaint  Patient presents with  . Abdominal Pain    HPI Teresa Clark is a 36 y.o. female.   Is a 36 year old woman who [facility] with her husband and 2 children complaining of abdominal pain and diarrhea for 3 months. She's been to the emergency room before and given Imodium and Zofran but this is not helped her. The pain and diarrhea are worse after eating and the location of the pain is no right upper quadrant and epigastrium and radiates around to the back.      Past Medical History:  Diagnosis Date  . Medical history non-contributory     Patient Active Problem List   Diagnosis Date Noted  . Language barrier, cultural differences 02/01/2017  . Post term pregnancy, 41 weeks 01/14/2014    Past Surgical History:  Procedure Laterality Date  . NO PAST SURGERIES      OB History    Gravida Para Term Preterm AB Living   3 3 3     3    SAB TAB Ectopic Multiple Live Births           3      Obstetric Comments   One child deceased around 851 1/2 years of age per patient.         Home Medications    Prior to Admission medications   Medication Sig Start Date End Date Taking? Authorizing Provider  Hyoscyamine Sulfate SL (LEVSIN/SL) 0.125 MG SUBL Place 1 tablet under the tongue 3 (three) times daily as needed. 02/13/17   Elvina SidleKurt Ellary Casamento, MD  loperamide (IMODIUM) 2 MG capsule Take 1 capsule (2 mg total) by mouth 4 (four) times daily as needed for diarrhea or loose stools. 02/01/17   Armando ReichertHeather D Hogan, CNM    Family History No family history on file.  Social History Social History  Substance Use Topics  . Smoking status: Never Smoker  . Smokeless tobacco: Never Used  . Alcohol use No     Allergies   Patient has no known allergies.   Review of Systems Review of Systems  Constitutional: Positive for  fatigue.  HENT: Negative.   Respiratory: Negative.   Cardiovascular: Negative.   Gastrointestinal: Positive for diarrhea and nausea.  Genitourinary: Negative.   Musculoskeletal: Negative.   Neurological: Negative.      Physical Exam Triage Vital Signs ED Triage Vitals [02/13/17 1927]  Enc Vitals Group     BP 124/84     Pulse Rate 72     Resp 18     Temp 98.4 F (36.9 C)     Temp Source Oral     SpO2 100 %     Weight      Height      Head Circumference      Peak Flow      Pain Score      Pain Loc      Pain Edu?      Excl. in GC?    No data found.   Updated Vital Signs BP 124/84 (BP Location: Right Arm)   Pulse 72   Temp 98.4 F (36.9 C) (Oral)   Resp 18   LMP 01/18/2017   SpO2 100%    Physical Exam  Constitutional: She is oriented to person, place, and time. She appears well-developed and  well-nourished.  HENT:  Head: Normocephalic.  Right Ear: External ear normal.  Left Ear: External ear normal.  Mouth/Throat: Oropharynx is clear and moist.  Eyes: Conjunctivae and EOM are normal. Pupils are equal, round, and reactive to light.  Neck: Normal range of motion. Neck supple.  Cardiovascular: Normal rate, regular rhythm and normal heart sounds.   Pulmonary/Chest: Effort normal and breath sounds normal.  Abdominal: Soft.  Hyperactive bowel sounds. Tenderness in the right upper quadrant and epigastrium with deep palpation No masses or HSM  Musculoskeletal: Normal range of motion.  Neurological: She is alert and oriented to person, place, and time.  Skin: Skin is warm and dry.  Psychiatric: She has a normal mood and affect.  Nursing note and vitals reviewed.    UC Treatments / Results  Labs (all labs ordered are listed, but only abnormal results are displayed) Labs Reviewed  GASTROINTESTINAL PANEL BY PCR, STOOL (REPLACES STOOL CULTURE)    EKG  EKG Interpretation None       Radiology No results found.  Procedures Procedures (including  critical care time)  Medications Ordered in UC Medications - No data to display   Initial Impression / Assessment and Plan / UC Course  I have reviewed the triage vital signs and the nursing notes.  Pertinent labs & imaging results that were available during my care of the patient were reviewed by me and considered in my medical decision making (see chart for details).     Final Clinical Impressions(s) / UC Diagnoses   Final diagnoses:  Diarrhea, unspecified type    New Prescriptions New Prescriptions   HYOSCYAMINE SULFATE SL (LEVSIN/SL) 0.125 MG SUBL    Place 1 tablet under the tongue 3 (three) times daily as needed.  Patient has an appointment with [facility] gastro[facility] at 11:00 on Thursday   Elvina Sidle, MD 02/13/17 2040

## 2017-02-13 NOTE — ED Notes (Signed)
Unable to provide stool. Notified dr Milus Glazierlauenstein Instructed this nurse to give patient something to drink.   Gave Ginger[place] to patient

## 2017-02-13 NOTE — ED Triage Notes (Signed)
Triaged by Provider using interpreter.

## 2017-02-15 ENCOUNTER — Emergency Department (HOSPITAL_COMMUNITY): Payer: BLUE CROSS/BLUE SHIELD

## 2017-02-15 ENCOUNTER — Ambulatory Visit (INDEPENDENT_AMBULATORY_CARE_PROVIDER_SITE_OTHER): Payer: BLUE CROSS/BLUE SHIELD | Admitting: Physician Assistant

## 2017-02-15 ENCOUNTER — Emergency Department (HOSPITAL_COMMUNITY)
Admission: EM | Admit: 2017-02-15 | Discharge: 2017-02-15 | Disposition: A | Discharge status: Home / Self Care | Payer: BLUE CROSS/BLUE SHIELD | Attending: Emergency Medicine | Admitting: Emergency Medicine

## 2017-02-15 ENCOUNTER — Encounter (HOSPITAL_COMMUNITY): Payer: Self-pay | Admitting: Emergency Medicine

## 2017-02-15 ENCOUNTER — Encounter: Payer: Self-pay | Admitting: Physician Assistant

## 2017-02-15 VITALS — BP 116/66 | HR 66 | Ht 63.0 in | Wt 120.0 lb

## 2017-02-15 DIAGNOSIS — K760 Fatty (change of) liver, not elsewhere classified: Secondary | ICD-10-CM | POA: Diagnosis not present

## 2017-02-15 DIAGNOSIS — K219 Gastro-esophageal reflux disease without esophagitis: Secondary | ICD-10-CM

## 2017-02-15 DIAGNOSIS — Z79899 Other long term (current) drug therapy: Secondary | ICD-10-CM | POA: Diagnosis not present

## 2017-02-15 DIAGNOSIS — R251 Tremor, unspecified: Secondary | ICD-10-CM | POA: Diagnosis not present

## 2017-02-15 DIAGNOSIS — K2900 Acute gastritis without bleeding: Secondary | ICD-10-CM | POA: Diagnosis not present

## 2017-02-15 DIAGNOSIS — F419 Anxiety disorder, unspecified: Secondary | ICD-10-CM

## 2017-02-15 DIAGNOSIS — R1011 Right upper quadrant pain: Secondary | ICD-10-CM | POA: Insufficient documentation

## 2017-02-15 DIAGNOSIS — R109 Unspecified abdominal pain: Secondary | ICD-10-CM | POA: Diagnosis not present

## 2017-02-15 DIAGNOSIS — M545 Low back pain: Secondary | ICD-10-CM | POA: Diagnosis not present

## 2017-02-15 DIAGNOSIS — R110 Nausea: Secondary | ICD-10-CM | POA: Diagnosis not present

## 2017-02-15 DIAGNOSIS — R197 Diarrhea, unspecified: Secondary | ICD-10-CM | POA: Diagnosis present

## 2017-02-15 LAB — CBC
HCT: 40.9 % (ref 36.0–46.0)
HEMOGLOBIN: 14 g/dL (ref 12.0–15.0)
MCH: 32.4 pg (ref 26.0–34.0)
MCHC: 34.2 g/dL (ref 30.0–36.0)
MCV: 94.7 fL (ref 78.0–100.0)
Platelets: 276 10*3/uL (ref 150–400)
RBC: 4.32 MIL/uL (ref 3.87–5.11)
RDW: 12.3 % (ref 11.5–15.5)
WBC: 11.4 10*3/uL — ABNORMAL HIGH (ref 4.0–10.5)

## 2017-02-15 LAB — BASIC METABOLIC PANEL
ANION GAP: 9 (ref 5–15)
BUN: 9 mg/dL (ref 6–20)
CO2: 23 mmol/L (ref 22–32)
Calcium: 9.7 mg/dL (ref 8.9–10.3)
Chloride: 108 mmol/L (ref 101–111)
Creatinine, Ser: 0.83 mg/dL (ref 0.44–1.00)
GFR calc Af Amer: >60 mL/min (ref >60)
GFR calc non Af Amer: >60 mL/min (ref >60)
GLUCOSE: 88 mg/dL (ref 65–99)
POTASSIUM: 3.9 mmol/L (ref 3.5–5.1)
Sodium: 140 mmol/L (ref 135–145)

## 2017-02-15 LAB — CBG MONITORING, ED: Glucose-Capillary: 88 mg/dL (ref 65–99)

## 2017-02-15 LAB — HCG, QUANTITATIVE, PREGNANCY: hCG, Beta Chain, Quant, S: <1 m[IU]/mL (ref <5)

## 2017-02-15 MED ORDER — ONDANSETRON HCL 4 MG PO TABS: ORAL_TABLET | ORAL | 0 refills | Status: DC

## 2017-02-15 MED ORDER — OMEPRAZOLE 40 MG PO CPDR: 40.0000 mg | DELAYED_RELEASE_CAPSULE | Freq: Every day | ORAL | 6 refills | Status: DC

## 2017-02-15 MED ORDER — GI COCKTAIL ~~LOC~~
30.0000 mL | Freq: Once | ORAL | Status: AC
  Administered 2017-02-15: 30 mL via ORAL
  Filled 2017-02-15: qty 30

## 2017-02-15 NOTE — Discharge Instructions (Signed)
Please read and follow all provided instructions.  Your diagnoses today include:  1. Anxiety   2. Acute gastritis without hemorrhage, unspecified gastritis type   3. RUQ pain     Tests performed today include: Blood counts and electrolytes Blood tests to check liver and kidney function Urine test to look for infection and pregnancy (in women) Vital signs. See below for your results today.   Medications prescribed:   Take any prescribed medications only as directed.  Home care instructions:  Follow any educational materials contained in this packet.  Follow-up instructions: Please follow-up with your primary care provider in the next 2 days for further evaluation of your symptoms.    Return instructions:  SEEK IMMEDIATE MEDICAL ATTENTION IF: The pain does not go away or becomes severe  A temperature above 101F develops  Repeated vomiting occurs (multiple episodes)  The pain becomes localized to portions of the abdomen. The right side could possibly be appendicitis. In an adult, the left lower portion of the abdomen could be colitis or diverticulitis.  Blood is being passed in stools or vomit (bright red or black tarry stools)  You develop chest pain, difficulty breathing, dizziness or fainting, or become confused, poorly responsive, or inconsolable (young children) If you have any other emergent concerns regarding your health  Additional Information: Abdominal (belly) pain can be caused by many things. Your caregiver performed an examination and possibly ordered blood/urine tests and imaging (CT scan, x-rays, ultrasound). Many cases can be observed and treated at home after initial evaluation in the emergency department. Even though you are being discharged home, abdominal pain can be unpredictable. Therefore, you need a repeated exam if your pain does not resolve, returns, or worsens. Most patients with abdominal pain don't have to be admitted to the hospital or have surgery, but  serious problems like appendicitis and gallbladder attacks can start out as nonspecific pain. Many abdominal conditions cannot be diagnosed in one visit, so follow-up evaluations are very important.  Your vital signs today were: BP 114/79 (BP Location: Right Arm)    Pulse 64    Temp 98 F (36.7 C)    Resp 14    LMP 01/18/2017    SpO2 100%  If your blood pressure (bp) was elevated above 135/85 this visit, please have this repeated by your doctor within one month. --------------

## 2017-02-15 NOTE — Care Management (Signed)
ED CM noted patient to have [employer] [place] patient can call the customer service numbr on the back of insurance card to obtain list of PCP accepting new patients within her insurance network to schedule post ED follow up visit. Updated [facility] ED nurse. No further ED CM needs identified.

## 2017-02-15 NOTE — Progress Notes (Addendum)
Chief Complaint: Abdominal Pain, GERD and Fatigue   HPI:  Teresa Clark is a 36 year old female who was referred to me by Willodean Rosenthal* for a complaint of 3 months of abdominal pain, GERD and fatigue.    Patient was recently seen in the ER on 02/13/17 after being seen at the urgent care with her husband and 2 children complaining of abdominal pain and diarrhea for 3 months. She had previously been to the emergency room and given Imodium and Zofran but this had not helped. The pain and diarrhea were worse after eating and this is typically in her right upper quadrant and epigastrium and radiated through to her back. Symptoms resolved in ED and she was discharged.   Patient was seen in the ER prior to this on 01/02/17 for diarrhea, epigastric and right upper quadrant abdominal pain. She described 20 episodes of watery diarrhea a day. Patient was diagnosed with gastroenteritis and diarrhea presumed infectious origin. She was given Zofran and Imodium.   Prior to this patient was seen 10/27/16 for diarrhea and upper abdominal pain since 10/16/16. She described abdominal pain worse with an episode of diarrhea. She also described decreased appetite and back pain. Patient was told to continue oral hydration at home and this was thought likely contagious diarrhea from her children who also had similar symptoms.   Per review of chart it appears patient has had stool studies ordered but has never completed them. Patient did have CMP and CBC completed 02/01/17. This showed a low potassium at 3.2 and low calcium at 8.8.   Today, the patient presents to clinic accompanied by her husband and young daughter. Her daughter does cough throughout time of my exam. They do call a friend on the phone who acts as an interpreter as their primary language is Burmese. The patient explains that she has had abdominal pain in the epigastrium and right upper quadrant for almost 3 months now, somewhat worse over the past couple of  days. She does describe constant nausea as well as a feeling of acid in her mouth. Apparently this is no different when she eats or does not.   She and her husband also point out some "red marks" on her back and patient complains of a low back pain.   The patient's largest complaint today as well is that she feels generally fatigued and "unwell". She is "too tired to do anything". They ask about B12 injections as well as various other medications. They also describe dizziness. Her husband also tells me that she is currently on her period.   After time of my immmediate exam, patient does go to the bathroom, when she comes back from the restroom she is walking slowly and visually more pale than she was previously and along with her husband and friends help on the phone describes shortness of breath, numbness in her fingers and a change in her hearing, patient is tearful at this time while clutching her upper abdomen.   Patient denies fever at home, blood in her stool, weight loss or help her medications including Zofran or hyoscyamine.  Past Medical History:  Diagnosis Date  . Medical history non-contributory     Past Surgical History:  Procedure Laterality Date  . NO PAST SURGERIES      Current Outpatient Prescriptions  Medication Sig Dispense Refill  . Hyoscyamine Sulfate SL (LEVSIN/SL) 0.125 MG SUBL Place 1 tablet under the tongue 3 (three) times daily as needed. 60 each 1  . loperamide (IMODIUM)  2 MG capsule Take 1 capsule (2 mg total) by mouth 4 (four) times daily as needed for diarrhea or loose stools. 30 capsule 3   No current facility-administered medications for this visit.     Allergies as of 02/15/2017  . (No Known Allergies)    Family History: Negative for IBD  Social History   Social History  . Marital status: Married    Spouse name: N/A  . Number of children: N/A  . Years of education: N/A   Occupational History  . Not on file.   Social History Main Topics  .  Smoking status: Never Smoker  . Smokeless tobacco: Never Used  . Alcohol use No  . Drug use: No  . Sexual activity: No   Other Topics Concern  . Not on file   Social History Narrative  . No narrative on file    Review of Systems:    Constitutional: Positive for chills and fatigue as well as weakness Skin: Positive for change in skin on patient's lower back Cardiovascular: No chest pain Respiratory: Positive for shortness of breath Gastrointestinal: See HPI and otherwise negative Genitourinary: No dysuria Neurological: Positive for dizziness Musculoskeletal: Positive for new low back pain Hematologic: No bleeding patient is on her period Psychiatric: Patient is depressed in the room   Physical Exam:  Vital signs: BP 116/66   Pulse 66   Ht 5\' 3"  (1.6 m)   Wt 120 lb (54.4 kg)   LMP 01/18/2017   BMI 21.26 kg/m    Constitutional:   Pleasant  female appears to be in moderate distress, Well developed, Well nourished, alert and cooperative, tearful, shaking Head:  Normocephalic and atraumatic. Eyes:   PEERL, EOMI. No icterus. Conjunctiva pink. Ears:  Normal auditory acuity. Neck:  Supple Throat: Oral cavity and pharynx without inflammation, swelling or lesion.  Respiratory: Respirations even and unlabored. Lungs clear to auscultation bilaterally.   No wheezes, crackles, or rhonchi.  Cardiovascular: Normal S1, S2. No MRG. Regular rate and rhythm. No peripheral edema, cyanosis or pallor.  Gastrointestinal:  Soft, nondistended, moderate ttp epigastrum and RUQ  No rebound or guarding. Normal bowel sounds. No appreciable masses or hepatomegaly. Rectal:  Not performed.  Msk:  Symmetrical without gross deformities. Without edema, no deformity or joint abnormality.  Neurologic:  Alert and  oriented x4;  grossly normal neurologically.  Skin:   Dry and intact without significant lesions. Capillaries over lumbar spine Psychiatric:  Demonstrates good judgement and reason without abnormal  affect or behaviors.tearful  MOST RECENT LABS: CBC    Component Value Date/Time   WBC 10.4 02/01/2017 1841   RBC 3.84 (L) 02/01/2017 1841   HGB 12.3 02/01/2017 1841   HCT 35.8 (L) 02/01/2017 1841   PLT 256 02/01/2017 1841   MCV 93.2 02/01/2017 1841   MCH 32.0 02/01/2017 1841   MCHC 34.4 02/01/2017 1841   RDW 12.3 02/01/2017 1841    CMP     Component Value Date/Time   NA 137 02/01/2017 1841   K 3.2 (L) 02/01/2017 1841   CL 106 02/01/2017 1841   CO2 25 02/01/2017 1841   GLUCOSE 120 (H) 02/01/2017 1841   BUN 6 02/01/2017 1841   CREATININE 0.72 02/01/2017 1841   CALCIUM 8.8 (L) 02/01/2017 1841   PROT 7.2 02/01/2017 1841   ALBUMIN 4.0 02/01/2017 1841   AST 17 02/01/2017 1841   ALT 18 02/01/2017 1841   ALKPHOS 42 02/01/2017 1841   BILITOT 0.5 02/01/2017 1841   GFRNONAA >60 02/01/2017  1841   GFRAA >60 02/01/2017 1841    Assessment: 1. Abdominal pain:Patient describes epigastric and right upper quadrant pain, worse over the past 2 days but present for the past 3-4 months, no recent abdominal imaging other than trasnvaginal u/s which was normal, recent CBC and CMP normal, now with reflux and constant nausea; Consider gastritis versus gallbladder etiology versus the flu versus other 2. Nausea: See above 3. GERD: Per chart review this appears to be a new complaint for the patient; consider H. pylori versus gastritis versus other 4. Low back pain: New complaint for the patient, with overall "sickness", consider the flu versus musculoskeletal origin 5. Dizziness: Consider relation to decreased diet and appetite recently? 6. Shortness of breath: This started somewhat acutely at the end of the exam, consider relation to anxiety versus other 7. Numbness: This also started acutely in patient, described in her fingertips, this will need to be evaluated in the ER  Plan: 1. Initially labs are ordered including a lipase and H. pylori antibody. Imaging was also ordered with an ultrasound is  it does not appear patient has had an abdominal imaging since her symptoms started. When patient returned to the room and had onset of acute symptoms and became tearful and her husband became worried she was advised to go to the ER. 2. Patient was helped down to the lobby today in a wheelchair, she was tearful as well as pale and shaking. Our staff did help her get to the ER and I spoke with the ER staff to discuss why she was there. They will need assistance from an interpreter. 3. Medications were sent today for the patient including Omeprazole 40 mg daily and Zofran 4 mg every 4-6 hours for nausea. 4. Patient will return to clinic in the next 2-3 weeks for further evaluation with Dr. Myrtie Neither or myself.    Greater than 80 minutes was spent in consultation and coordination of care for this patient.  Hyacinth Meeker, PA-C [facility] 02/15/2017, 11:24 AM  Cc: Willodean Rosenthal*   Thank you for sending this case to me. I have reviewed the entire note, and the outlined plan seems appropriate. Puzzling story. When she can be seen in clinic and is not in acute distress, sounds as if she may need EGD/colonoscopy if we can get adequate translation services available.  Amada Jupiter, MD

## 2017-02-15 NOTE — ED Triage Notes (Addendum)
Pt c/o episode of body being cold, blue-tinged, body shaking, lasted 20 minutes, no LOC. History 1 similar episode 1 week ago, no other such episodes. Does not take medications. Currently c/o SOB, generalized numbness, no pain.   Speaks Burmese only, history obtained through translator, difficulty obtaining proper history despite Nurse, learning disabilitytranslator.

## 2017-02-15 NOTE — Patient Instructions (Addendum)
Your physician has requested that you go to the basement for lab work before leaving today  You have been [facility] of hospital) on 02/19/2017 at 8:30am. Please arrive 15 minutes prior to your appointment for registration. Make certain not to have anything to eat or drink 6 hours prior to your appointment. Should you need to reschedule your appointment, please contact radiology at [phone]. This test typically takes about 30 minutes to perform.  We have sent the following medications to your pharmacy for you to pick up at your convenience: Omeprazole, Zofran  Use ibuprofen over the counter and a heating pad on your back

## 2017-02-15 NOTE — ED Provider Notes (Signed)
[facility]-EMERGENCY DEPT Provider Note   CSN: [id number] Arrival date & time: 02/15/17  1235     History   Chief Complaint Chief Complaint  Patient presents with  . Shortness of Breath  . Seizures    HPI Teresa Clark is a 36 y.o. female.  HPI  36 y.o. female presents to the Emergency Department today complaining of brief episode of "being cold," with blue discoloration, generalized body shakes, that lasted for 20 minutes. Notes no LOC. No Hx seizures. Pt was at gastroenterologist office when symptoms began due to diarrhea and being referred by OBGYN. Told to come to ED for evaluation. Endorses similar episode x 1 week ago that occurred only once. Pt notes shortness of breath as well as generalized numbness. No pain currently. No N/V. Notes diarrhea x 2-3 days. No fevers. No cough, congestion, No rhinorrhea. No URI symptoms. No urinary symptoms. No dysuria. No vaginal bleeding. No vaginal discharge. No remarkable medical history per patient. No other symptoms noted.   HPI provided by [employer].   Past Medical History:  Diagnosis Date  . Medical history non-contributory     Patient Active Problem List   Diagnosis Date Noted  . Language barrier, cultural differences 02/01/2017  . Post term pregnancy, 41 weeks 01/14/2014    Past Surgical History:  Procedure Laterality Date  . NO PAST SURGERIES      OB History    Gravida Para Term Preterm AB Living   3 3 3     3    SAB TAB Ectopic Multiple Live Births           3      Obstetric Comments   One child deceased around 16 1/2 years of age per patient.         Home Medications    Prior to Admission medications   Medication Sig Start Date End Date Taking? Authorizing Provider  Hyoscyamine Sulfate SL (LEVSIN/SL) 0.125 MG SUBL Place 1 tablet under the tongue 3 (three) times daily as needed. 02/13/17   Elvina Sidle, MD  omeprazole (PRILOSEC) 40 MG capsule Take 1 capsule (40 mg total) by mouth daily. 02/15/17   Unk Lightning, PA  ondansetron [facility]) 4 MG tablet Take one tablet 4-6 hours as needed for nausea 02/15/17   Unk Lightning, PA   Family History History reviewed. No pertinent family history.  Social History Social History  Substance Use Topics  . Smoking status: Never Smoker  . Smokeless tobacco: Never Used  . Alcohol use No   Allergies   Patient has no known allergies.   Review of Systems Review of Systems ROS reviewed and all are negative for acute change except as noted in the HPI.  Physical Exam Updated Vital Signs BP 112/74 (BP Location: Right Arm)   Pulse 66   Temp 98.2 F (36.8 C) (Oral)   Resp 18   LMP 01/18/2017   SpO2 98%   Physical Exam  Constitutional: She is oriented to person, place, and time. Vital signs are normal. She appears well-developed and well-nourished. No distress.  Pt appears anxious. Increased respirations.   HENT:  Head: Normocephalic and atraumatic.  Right Ear: Hearing, tympanic membrane, external ear and ear canal normal.  Left Ear: Hearing, tympanic membrane, external ear and ear canal normal.  Nose: Nose normal.  Mouth/Throat: Uvula is midline, oropharynx is clear and moist and mucous membranes are normal. No trismus in the jaw. No oropharyngeal exudate, posterior oropharyngeal erythema or tonsillar abscesses.  Eyes: Conjunctivae and EOM are normal. Pupils are equal, round, and reactive to light.  Neck: Normal range of motion. Neck supple. No tracheal deviation present.  Cardiovascular: Normal rate, regular rhythm, S1 normal, S2 normal, normal heart sounds, intact distal pulses and normal pulses.   Pulmonary/Chest: Effort normal and breath sounds normal. No accessory muscle usage. No respiratory distress. She has no decreased breath sounds. She has no wheezes. She has no rhonchi. She has no rales.  Abdominal: Normal appearance and bowel sounds are normal. There is no tenderness.  Musculoskeletal: Normal range of motion.  Neurological: She  is alert and oriented to person, place, and time. She has normal strength. No cranial nerve deficit or sensory deficit.  BUE/BLE without decrease in sensation to sharp/dull. Grip strengths equal. Speech clear.  Skin: Skin is warm and dry.  Psychiatric: She has a normal mood and affect. Her speech is normal and behavior is normal. Thought content normal.  Nursing note and vitals reviewed.  ED Treatments / Results  Labs (all labs ordered are listed, but only abnormal results are displayed) Labs Reviewed  CBC - Abnormal; Notable for the following:       Result Value   WBC 11.4 (*)    All other components within normal limits  BASIC METABOLIC PANEL  HCG, QUANTITATIVE, PREGNANCY  CBG MONITORING, ED   EKG  EKG Interpretation None      Radiology Dg Chest 2 View  Result Date: 02/15/2017 CLINICAL DATA:  Tremors, numbness, body feels cold. EXAM: CHEST  2 VIEW COMPARISON:  None. FINDINGS: The heart size and mediastinal contours are within normal limits. Both lungs are clear. The visualized skeletal structures are unremarkable. IMPRESSION: No active cardiopulmonary disease. Electronically Signed   By: Tollie Eth M.D.   On: 02/15/2017 13:56   US Abdomen Limited Ruq  Result Date: 02/15/2017 CLINICAL DATA:  Right upper quadrant pain. EXAM: US ABDOMEN LIMITED - RIGHT UPPER QUADRANT COMPARISON:  01/13/2014. FINDINGS: Gallbladder: Sludge is noted gallbladder. Gallbladder wall thickness 2 mm. Negative Murphy sign. Common bile duct: Diameter: 2.5 mm Liver: Liver is echogenic consistent fatty infiltration and/or hepatocellular disease. Areas of decreased echogenicity noted adjacent to the porta hepatis are most consistent with focal fatty sparing . IMPRESSION: 1. Sludge noted gallbladder. No evidence of cholecystitis. No evidence of biliary distention. 2.  Fatty infiltration liver with focal fatty sparing. Electronically Signed   By: Maisie Fus  Register   On: 02/15/2017 16:32   Procedures Procedures  (including critical care time)  Medications Ordered in ED Medications  gi cocktail (Maalox,Lidocaine,Donnatal) (30 mLs Oral Given 02/15/17 1555)   Initial Impression / Assessment and Plan / ED Course  I have reviewed the triage vital signs and the nursing notes.  Pertinent labs & imaging results that were available during my care of the patient were reviewed by me and considered in my medical decision making (see chart for details).  Final Clinical Impressions(s) / ED Diagnoses  {I have reviewed and evaluated the relevant laboratory values. {I have reviewed and evaluated the relevant imaging studies.  {I have reviewed the relevant previous healthcare records.  {I obtained HPI from historian. {Patient discussed with supervising physician.  ED Course:  Assessment: Pt is a 36 y.o. female who presents with shaking spells with blue discoloration for GI office that occurred x 20 minutes. Per GI note, patient returned from bathroom and began having acute onset of pain likely from epigastrium and started shaking and became tearful. Pt noted to have 3 month hx  of diarrhea as well as nausea with burning sensation in throat. No worsening with PO intake. Pt has had stool studies ordered, but never completed. Pt started on Omeprazole as well as Zofran for symptoms by GI. Follow up scheduled. On exam, pt in NAD. Nontoxic/nonseptic appearing. VSS. Afebrile. Lungs CTA. Heart RRR. Abdomen nontender soft. No acute seizure like activity noted. Appears slightly anxious. CBC unremarkable. BMP unremarkable. CXR negative. EKG unremarkable. Doubt flu as patient without fever and without tachycardia. Even if so, pt outside window with duration of symptoms for Tamiflu treatment. Given GI cocktail in ED due to GI note of gastritis. Doubt gall bladder etiology based on exam. I believe the symptoms today are related to gastritis which caused a vagal reaction. Doubt seizure etiology given history and previous documentation.  Discussed presentation with GI PA at today's visit. Will obtain RUQ Ultrasound for further eval of gall stones. Pt denied diarrhea at GI visit. Bedside review of ultrasound showed no evidence of cholecystitis. No gallstones. Possible biliary sludge noted. Discussed results with patient. Given strict return precautions to ED if symptoms worsen. At time of discharge, Patient is in no acute distress. Vital Signs are stable. Patient is able to ambulate. Patient able to tolerate PO.   Consult placed to Case management to help with PCP follow up.   Disposition/Plan:  DC Home Additional Verbal discharge instructions given and discussed with patient.  Pt Instructed to f/u with PCP in the next week for evaluation and treatment of symptoms. Return precautions given Pt acknowledges and agrees with plan  Supervising Physician Shaune Pollackameron Isaacs, MD  Final diagnoses:  Anxiety  Acute gastritis without hemorrhage, unspecified gastritis type    New Prescriptions New Prescriptions   No medications on file     Audry Piliyler Daniela Siebers, PA-C 02/15/17 1647    Shaune Pollackameron Isaacs, MD 02/15/17 [id number]

## 2017-02-16 LAB — GASTROINTESTINAL PANEL BY PCR, STOOL (REPLACES STOOL CULTURE)

## 2017-02-19 ENCOUNTER — Ambulatory Visit (HOSPITAL_COMMUNITY): Payer: BLUE CROSS/BLUE SHIELD

## 2017-03-06 ENCOUNTER — Encounter: Payer: Self-pay | Admitting: Gastroenterology

## 2017-03-06 ENCOUNTER — Ambulatory Visit (INDEPENDENT_AMBULATORY_CARE_PROVIDER_SITE_OTHER): Payer: BLUE CROSS/BLUE SHIELD | Admitting: Gastroenterology

## 2017-03-06 VITALS — BP 108/80 | HR 100 | Ht 59.5 in | Wt 123.0 lb

## 2017-03-06 DIAGNOSIS — R197 Diarrhea, unspecified: Secondary | ICD-10-CM | POA: Diagnosis not present

## 2017-03-06 DIAGNOSIS — R1084 Generalized abdominal pain: Secondary | ICD-10-CM

## 2017-03-06 DIAGNOSIS — R110 Nausea: Secondary | ICD-10-CM

## 2017-03-06 MED ORDER — NA SULFATE-K SULFATE-MG SULF 17.5-3.13-1.6 GM/177ML PO SOLN: 1.0000 | Freq: Once | ORAL | 0 refills | Status: AC

## 2017-03-06 NOTE — Patient Instructions (Signed)
If you are age 34 or older, your body mass index should be between 23-30. Your Body mass index is 24.43 kg/m. If this is out of the aforementioned range listed, please consider follow up with your Primary Care Provider.  If you are age 764 or younger, your body mass index should be between 19-25. Your Body mass index is 24.43 kg/m. If this is out of the aformentioned range listed, please consider follow up with your Primary Care Provider.   You have been scheduled for an endoscopy and colonoscopy. Please follow the written instructions given to you at your visit today. Please pick up your prep supplies at the pharmacy within the next 1-3 days. If you use inhalers (even only as needed), please bring them with you on the day of your procedure.  Thank you for choosing [facility]  Dr Amada JupiterHenry Danis III

## 2017-03-06 NOTE — Progress Notes (Signed)
[facility] Progress Note  Chief Complaint: Abdominal pain and diarrhea.  Subjective  History:  This woman follows up after her initial clinic visit 3 weeks ago with one of our PAs. Please see that lengthy note for details of this patient's story. Even with the aid of an interpreter today, I had considerable difficulty getting a clear and consistent history of the symptoms. She has had multiple ED visits for a variety of GI symptoms that have been evolving and migratory over time. It seems that she may still be having some diarrhea, but usually has 1 BM day. It is sometimes loose and it does not seem as if there is been blood. She had a positive stool study for EP EC, but that was felt possibly unrelated and did not need antibiotics. She seems to still have some upper GI symptoms in the way of nausea and heartburn.  When I brought up the possibility of anxiety being related to these symptoms (given the apparent panic attack the patient had in our clinic 3 weeks ago), they seem somewhat reluctant to accept that as an explanation.  She does not know much of anything about her family history.  As near as I can tell, her appetite has been reasonably good and weight stable.   ROS: Cardiovascular:  no chest pain Respiratory: no dyspnea  The patient's Past Medical, Family and Social History were reviewed and are on file in the EMR.  Objective:  Med list reviewed  Vital signs in last 24 hrs: Vitals:   03/06/17 1550  BP: 108/80  Pulse: 100    Physical Exam  She is somewhat anxious appearing with poor eye contact. She and her husband seem to have lengthy conversations that even the interpreter is having difficulty following when I ask for clarification of symptoms.  HEENT: sclera anicteric, oral mucosa moist without lesions  Neck: supple, no thyromegaly, JVD or lymphadenopathy  Cardiac: RRR without murmurs, S1S2 heard, no peripheral edema  Pulm: clear to auscultation  bilaterally, normal RR and effort noted  Abdomen: soft, No tenderness, with active bowel sounds. No guarding or palpable hepatosplenomegaly.  Skin; warm and dry, no jaundice or rash  Recent Labs:  CBC Latest Ref Rng & Units 02/15/2017 02/01/2017 10/17/2016  WBC 4.0 - 10.5 K/uL 11.4(H) 10.4 11.6(H)  Hemoglobin 12.0 - 15.0 g/dL 60.6 30.1 60.1  Hematocrit 36.0 - 46.0 % 40.9 35.8(L) 41.5  Platelets 150 - 400 K/uL 276 256 298   CMP Latest Ref Rng & Units 02/15/2017 02/01/2017 10/17/2016  Glucose 65 - 99 mg/dL 88 093(A) 355(D)  BUN 6 - 20 mg/dL 9 6 11   Creatinine 0.44 - 1.00 mg/dL 3.22 0.25 4.27  Sodium 135 - 145 mmol/L 140 137 137  Potassium 3.5 - 5.1 mmol/L 3.9 3.2(L) 3.3(L)  Chloride 101 - 111 mmol/L 108 106 108  CO2 22 - 32 mmol/L 23 25 22   Calcium 8.9 - 10.3 mg/dL 9.7 0.6(C) 37.6  Total Protein 6.5 - 8.1 g/dL - 7.2 8.1  Total Bilirubin 0.3 - 1.2 mg/dL - 0.5 0.8  Alkaline Phos 38 - 126 U/L - 42 63  AST 15 - 41 U/L - 17 19  ALT 14 - 54 U/L - 18 16    Radiologic studies:  US - GB sludge  @ASSESSMENTPLANBEGIN @ Assessment: Encounter Diagnoses  Name Primary?  . Generalized abdominal pain Yes  . Diarrhea, unspecified type   . Nausea     I have been unable to further characterize these  symptoms to determine if they fit the pattern of an organic digestive disease. I suspect there may be significant anxiety component given our experience with her thus far. Negative lab and imaging workup so far has been reassuring.  Plan:  EGD and colonoscopy. Procedures described in detail with the interpreter present. She is agreeable. Even if these studies are normal, I am hopeful they will be somewhat reassuring and she can move on with alternate therapy.  The benefits and risks of the planned procedure were described in detail with the patient or (when appropriate) their health care proxy.  Risks were outlined as including, but not limited to, bleeding, infection, perforation, adverse medication  reaction leading to cardiac or pulmonary decompensation, or pancreatitis (if ERCP).  The limitation of incomplete mucosal visualization was also discussed.  No guarantees or warranties were given.   Total time 45 minutes, over half spent in counseling and coordination of care. Extra time was required given the difficulty obtaining history and need for interpreter services.  Charlie PitterHenry L Danis III

## 2017-03-30 ENCOUNTER — Encounter: Payer: Self-pay | Admitting: Gastroenterology

## 2017-04-11 ENCOUNTER — Ambulatory Visit (AMBULATORY_SURGERY_CENTER): Payer: BLUE CROSS/BLUE SHIELD | Admitting: Gastroenterology

## 2017-04-11 ENCOUNTER — Encounter: Payer: Self-pay | Admitting: Gastroenterology

## 2017-04-11 VITALS — BP 102/67 | HR 63 | Temp 98.9°F | Resp 21 | Ht 59.0 in | Wt 123.0 lb

## 2017-04-11 DIAGNOSIS — R1084 Generalized abdominal pain: Secondary | ICD-10-CM | POA: Diagnosis not present

## 2017-04-11 DIAGNOSIS — R197 Diarrhea, unspecified: Secondary | ICD-10-CM

## 2017-04-11 DIAGNOSIS — K2950 Unspecified chronic gastritis without bleeding: Secondary | ICD-10-CM | POA: Diagnosis not present

## 2017-04-11 MED ORDER — SODIUM CHLORIDE 0.9 % IV SOLN: 500.0000 mL | INTRAVENOUS | Status: DC

## 2017-04-11 NOTE — Patient Instructions (Signed)
YOU HAD AN ENDOSCOPIC PROCEDURE TODAY AT THE [facility]:   Refer to the procedure report that was given to you for any specific questions about what was found during the examination.  If the procedure report does not answer your questions, please call your gastroenterologist to clarify.  If you requested that your care partner not be given the details of your procedure findings, then the procedure report has been included in a sealed envelope for you to review at your convenience later.  YOU SHOULD EXPECT: Some feelings of bloating in the abdomen. Passage of more gas than usual.  Walking can help get rid of the air that was put into your GI tract during the procedure and reduce the bloating. If you had a lower endoscopy (such as a colonoscopy or flexible sigmoidoscopy) you may notice spotting of blood in your stool or on the toilet paper. If you underwent a bowel prep for your procedure, you may not have a normal bowel movement for a few days.  Please Note:  You might notice some irritation and congestion in your nose or some drainage.  This is from the oxygen used during your procedure.  There is no need for concern and it should clear up in a day or so.  SYMPTOMS TO REPORT IMMEDIATELY:   Following lower endoscopy (colonoscopy or flexible sigmoidoscopy):  Excessive amounts of blood in the stool  Significant tenderness or worsening of abdominal pains  Swelling of the abdomen that is new, acute  Fever of 100F or higher   Following upper endoscopy (EGD)  Vomiting of blood or coffee ground material  New chest pain or pain under the shoulder blades  Painful or persistently difficult swallowing  New shortness of breath  Fever of 100F or higher  Black, tarry-looking stools  For urgent or emergent issues, a gastroenterologist can be reached at any hour by calling [phone].   DIET:  We do recommend a small meal at first, but then you may proceed to your regular diet.  Drink  plenty of fluids but you should avoid alcoholic beverages for 24 hours.  ACTIVITY:  You should plan to take it easy for the rest of today and you should NOT DRIVE or use heavy machinery until tomorrow (because of the sedation medicines used during the test).    FOLLOW UP: Our staff will call the number listed on your records the next business day following your procedure to check on you and address any questions or concerns that you may have regarding the information given to you following your procedure. If we do not reach you, we will leave a message.  However, if you are feeling well and you are not experiencing any problems, there is no need to return our call.  We will assume that you have returned to your regular daily activities without incident.  If any biopsies were taken you will be contacted by phone or by letter within the next 1-3 weeks.  Please call us at [phone] if you have not heard about the biopsies in 3 weeks.   Await for biopsy results   SIGNATURES/CONFIDENTIALITY: You and/or your care partner have signed paperwork which will be entered into your electronic medical record.  These signatures attest to the fact that that the information above on your After Visit Summary has been reviewed and is understood.  Full responsibility of the confidentiality of this discharge information lies with you and/or your care-partner. 

## 2017-04-11 NOTE — Progress Notes (Signed)
Pt's states no medical or surgical changes since previsit or office visit. 

## 2017-04-11 NOTE — Progress Notes (Signed)
A and O x3. Report to RN. Tolerated MAC anesthesia well.Teeth unchanged after procedure.

## 2017-04-11 NOTE — Op Note (Signed)
[facility] Patient Name: Teresa Clark Procedure Date: 04/11/2017 2:50 PM MRN: [id number] Endoscopist: Sherilyn Cooter L. Myrtie Neither , MD Age: 36 Referring MD:  Date of Birth: [birth year] Gender: Female Account #: [id number] Procedure:                Upper GI endoscopy Indications:              Generalized abdominal pain, Diarrhea Medicines:                Monitored Anesthesia Care Procedure:                Pre-Anesthesia Assessment:                           - Prior to the procedure, a History and Physical                            was performed, and patient medications and                            allergies were reviewed. The patient's tolerance of                            previous anesthesia was also reviewed. The risks                            and benefits of the procedure and the sedation                            options and risks were discussed with the patient.                            All questions were answered, and informed consent                            was obtained. Prior Anticoagulants: The patient has                            taken no previous anticoagulant or antiplatelet                            agents. ASA Grade Assessment: II - A patient with                            mild systemic disease. After reviewing the risks                            and benefits, the patient was deemed in                            satisfactory condition to undergo the procedure.                           After obtaining informed consent, the endoscope was  passed under direct vision. Throughout the                            procedure, the patient's blood pressure, pulse, and                            oxygen saturations were monitored continuously. The                            Endoscope was introduced through the mouth, and                            advanced to the second part of duodenum. The upper                            GI endoscopy was  accomplished without difficulty.                            The patient tolerated the procedure well. Scope In: Scope Out: Findings:                 The larynx was normal.                           The esophagus was normal.                           Multiple, non-bleeding erosions were found in the                            gastric antrum. There were no stigmata of recent                            bleeding. Several biopsies were obtained in the                            gastric body and in the gastric antrum with cold                            forceps for histology.                           The cardia and gastric fundus were normal on                            retroflexion.                           The examined duodenum was normal. Biopsies were                            taken with a cold forceps for histology. Complications:            No immediate complications. Estimated Blood Loss:     Estimated blood loss: none. Impression:               -  Normal larynx.                           - Normal esophagus.                           - Non-bleeding erosive gastropathy.                           - Normal examined duodenum. Biopsied.                           - Several biopsies were obtained in the gastric                            body and in the gastric antrum. Recommendation:           - Patient has a contact number available for                            emergencies. The signs and symptoms of potential                            delayed complications were discussed with the                            patient. Return to normal activities tomorrow.                            Written discharge instructions were provided to the                            patient.                           - Resume previous diet.                           - Continue present medications.                           - See the other procedure note for documentation of                             additional recommendations. Jesslynn Kruck L. Myrtie Neither, MD 04/11/2017 3:18:25 PM This report has been signed electronically.

## 2017-04-11 NOTE — Progress Notes (Signed)
Called to room to assist during endoscopic procedure.  Patient ID and intended procedure confirmed with present staff. Received instructions for my participation in the procedure from the performing physician.  

## 2017-04-11 NOTE — Op Note (Signed)
[facility] Patient Name: Teresa Clark Procedure Date: 04/11/2017 2:50 PM MRN: [id number] Endoscopist: Sherilyn Cooter L. Myrtie Neither , MD Age: 36 Referring MD:  Date of Birth: [birth year] Gender: Female Account #: [id number] Procedure:                Colonoscopy Indications:              Generalized abdominal pain, Clinically significant                            diarrhea of unexplained origin Medicines:                Monitored Anesthesia Care Procedure:                Pre-Anesthesia Assessment:                           - Prior to the procedure, a History and Physical                            was performed, and patient medications and                            allergies were reviewed. The patient's tolerance of                            previous anesthesia was also reviewed. The risks                            and benefits of the procedure and the sedation                            options and risks were discussed with the patient.                            All questions were answered, and informed consent                            was obtained. Prior Anticoagulants: The patient has                            taken no previous anticoagulant or antiplatelet                            agents. ASA Grade Assessment: II - A patient with                            mild systemic disease. After reviewing the risks                            and benefits, the patient was deemed in                            satisfactory condition to undergo the procedure.  After obtaining informed consent, the colonoscope                            was passed under direct vision. Throughout the                            procedure, the patient's blood pressure, pulse, and                            oxygen saturations were monitored continuously. The                            Colonoscope was introduced through the anus and                            advanced to the the terminal ileum.  The colonoscopy                            was performed without difficulty. The patient                            tolerated the procedure well. The quality of the                            bowel preparation was excellent. The terminal                            ileum, ileocecal valve, appendiceal orifice, and                            rectum were photographed. The quality of the bowel                            preparation was evaluated using the BBPS [facility]                            Bowel Preparation Scale) with scores of: Right                            Colon = 3, Transverse Colon = 3 and Left Colon = 3                            (entire mucosa seen well with no residual staining,                            small fragments of stool or opaque liquid). The                            total BBPS score equals 9. The bowel preparation                            used was SUPREP. Scope In: 2:03:00 PM Scope Out: 3:12:52 PM Scope Withdrawal Time: 1  hour 6 minutes 52 seconds  Total Procedure Duration: 1 hour 9 minutes 52 seconds  Findings:                 The perianal and digital rectal examinations were                            normal.                           The terminal ileum appeared normal.                           The colon (entire examined portion) appeared                            normal. Biopsies for histology were taken with a                            cold forceps from the right colon and left colon                            for evaluation of microscopic colitis.                           The exam was otherwise without abnormality on                            direct and retroflexion views. Complications:            No immediate complications. Estimated Blood Loss:     Estimated blood loss: none. Impression:               - The examined portion of the ileum was normal.                           - The entire examined colon is normal. Biopsied.                            - The examination was otherwise normal on direct                            and retroflexion views. Recommendation:           - Patient has a contact number available for                            emergencies. The signs and symptoms of potential                            delayed complications were discussed with the                            patient. Return to normal activities tomorrow.                            Written discharge instructions were provided to the  patient.                           - Resume previous diet.                           - Continue present medications.                           - Await pathology results.                           - No recommendation at this time regarding repeat                            colonoscopy due to age. Sinai Mahany L. Myrtie Neither, MD 04/11/2017 3:20:23 PM This report has been signed electronically.

## 2017-04-12 ENCOUNTER — Telehealth: Payer: Self-pay | Admitting: *Deleted

## 2017-04-12 NOTE — Telephone Encounter (Signed)
  Follow up Call-  Call back number 04/11/2017  Post procedure Call Back phone  # [phone] Belinda Fisher friend for call back  Permission to leave phone message Yes  Some recent data might be hidden     Patient questions:  Phone message states that the phone # does not accept outside calls.

## 2017-04-17 ENCOUNTER — Encounter: Payer: Self-pay | Admitting: Gastroenterology

## 2017-04-18 ENCOUNTER — Other Ambulatory Visit: Payer: Self-pay

## 2017-04-19 ENCOUNTER — Other Ambulatory Visit: Payer: Self-pay

## 2017-04-23 ENCOUNTER — Other Ambulatory Visit: Payer: Self-pay

## 2017-04-23 ENCOUNTER — Telehealth: Payer: Self-pay | Admitting: Gastroenterology

## 2017-04-23 MED ORDER — BISMUTH SUBSALICYLATE 262 MG PO CHEW
524.0000 mg | CHEWABLE_TABLET | Freq: Four times a day (QID) | ORAL | 0 refills | Status: AC
Start: 2017-04-23 — End: 2017-05-03

## 2017-04-23 MED ORDER — DOXYCYCLINE HYCLATE 100 MG PO TABS: 100.0000 mg | ORAL_TABLET | Freq: Two times a day (BID) | ORAL | 0 refills | Status: AC

## 2017-04-23 MED ORDER — METRONIDAZOLE 250 MG PO TABS: 250.0000 mg | ORAL_TABLET | Freq: Four times a day (QID) | ORAL | 0 refills | Status: AC

## 2017-05-25 ENCOUNTER — Telehealth: Payer: Self-pay

## 2017-05-25 ENCOUNTER — Other Ambulatory Visit: Payer: Self-pay

## 2017-05-25 DIAGNOSIS — A048 Other specified bacterial intestinal infections: Secondary | ICD-10-CM

## 2017-05-25 NOTE — Telephone Encounter (Signed)
Mailed letter to patient re: need to retest for H. Pylori.

## 2017-05-25 NOTE — Telephone Encounter (Signed)
-----   Message from Charlie PitterHenry L Danis III, MD sent at 05/24/2017 12:52 PM EDT ----- If you can. Clelia SchaumannGonna be tough since not Alban[place]English speaking. No PPI 5 days prior. Stool study easier with need to interpret instructions? That are breath test - whatever you think we can get done.  - HD ----- Message ----- From: Leverne HumblesFournier, Taiga Lupinacci M, RN Sent: 05/24/2017   9:32 AM To: Charlie PitterHenry L Danis III, MD  Looking back at the letter sent and result note, I just wanted to see if you wanted to retest patient for H. Pylori, she should have finished her antibiotics on 5/17-18. Thanks.

## 2017-06-08 ENCOUNTER — Telehealth: Payer: Self-pay

## 2017-06-08 NOTE — Telephone Encounter (Signed)
Telephone call to [employer] to assist with Burmese language. Nicole CellaDorothy Jesica Goheen RN,BSN,PCCN,CNP [phone]

## 2017-06-08 NOTE — Congregational Nurse Program (Signed)
Congregational Nurse Program Note  Date of Encounter: 06/08/2017  Past Medical History: Past Medical History:  Diagnosis Date  . Medical history non-contributory     Encounter Details:     CNP Questionnaire - 06/08/17 1300      Patient Demographics   Is this a new or existing patient? Existing   Patient is considered a/an Refugee     Patient Assistance   Location of Patient Assistance [place]   Patient's financial/insurance status Private Insurance Coverage;Medicaid   Uninsured Patient ([place] Card/Care Connects) No   Patient referred to apply for the following financial assistance Not Applicable   Food insecurities addressed Not Applicable   Transportation assistance No   Assistance securing medications No   Educational health offerings Navigating the healthcare system;Spiritual care     Encounter Details   Primary purpose of visit Navigating the Healthcare System;Spiritual Care/Support Visit   Was an Emergency Department visit averted? Not Applicable   Does patient have a medical provider? Yes   Patient referred to Other   Was a mental health screening completed? (GAINS tool) No   Does patient have dental issues? No   Does patient have vision issues? No   Does your patient have an abnormal blood pressure today? No   Since previous encounter, have you referred patient for abnormal blood pressure that resulted in a new diagnosis or medication change? No   Does your patient have an abnormal blood glucose today? No   Since previous encounter, have you referred patient for abnormal blood glucose that resulted in a new diagnosis or medication change? No   Was there a life-saving intervention made? No     Client came to the the community center requesting assistance with reading and understanding appoitment letter from gastro entologist office. Letter was explained in details with the help of [employer]. Client was reminded to stop taking acid reflux medicine  5 days prior to the appointment per Drs instructions.and to provide a stool sample as instructed. She verbalized understanding and had no futher questions. Kairav Russomanno RN,BSN,PCCN,CNP. [phone]

## 2017-06-08 NOTE — Congregational Nurse Program (Signed)
Congregational Nurse Program Note  Date of Encounter: 06/08/2017  Past Medical History: Past Medical History:  Diagnosis Date  . Medical history non-contributory     Encounter Details:     CNP Questionnaire - 06/08/17 1435      Patient Demographics   Is this a new or existing patient? Existing   Patient is considered a/an Refugee   Race Asian     Patient Assistance   Location of Patient Assistance [place]   Patient's financial/insurance status Private Insurance Coverage;Medicaid   Uninsured Patient ([place] Card/Care Connects) No   Patient referred to apply for the following financial assistance Not Applicable   Food insecurities addressed Not Applicable   Transportation assistance No   Assistance securing medications No   Educational health offerings Navigating the healthcare system;Spiritual care     Encounter Details   Primary purpose of visit Navigating the Healthcare System;Spiritual Care/Support Visit   Was an Emergency Department visit averted? Not Applicable   Does patient have a medical provider? Yes   Patient referred to Other  Client has an appointment for stool sample to check H.pylori   Was a mental health screening completed? (GAINS tool) No   Does patient have dental issues? No   Does patient have vision issues? No   Does your patient have an abnormal blood pressure today? No   Since previous encounter, have you referred patient for abnormal blood pressure that resulted in a new diagnosis or medication change? No   Does your patient have an abnormal blood glucose today? No   Since previous encounter, have you referred patient for abnormal blood glucose that resulted in a new diagnosis or medication change? No   Was there a life-saving intervention made? No     duplicate encouter

## 2017-09-18 NOTE — Telephone Encounter (Signed)
Done

## 2018-02-08 NOTE — Congregational Nurse Program (Signed)
Congregational Nurse Program Note  Date of Encounter: 02/08/2018  Past Medical History: Past Medical History:  Diagnosis Date  . Medical history non-contributory     Encounter Details: CNP Questionnaire - 02/08/18 1401      Questionnaire   Patient Status  Refugee    Race  Asian    Location Patient Served At  Not Applicable    Insurance  Private Insurance    Uninsured  Not Applicable    Food  No food insecurities    Housing/Utilities  Yes, have permanent housing    Transportation  Yes, need transportation assistance    Interpersonal Safety  Yes, feel physically and emotionally safe where you currently live    Medication  No medication insecurities    Medical Provider  No    Referrals  Not Applicable    ED Visit Averted  Not Applicable    Life-Saving Intervention Made  Not Applicable     Ms Teresa Clark came in for BP check. She does not have a PCP. She will be back again when ready for assistance getting a PCP.She is not ready at this time and has been education on the importance of having a PCP. Nicole Cellaorothy Chrissy Ealey RN BSN PCCN CNP [phone]

## 2018-06-25 NOTE — Congregational Nurse Program (Signed)
Congregational Nurse Program Note  Date of Encounter: 06/25/2018  Past Medical History: Past Medical History:  Diagnosis Date  . Medical history non-contributory     Encounter Details: CNP Questionnaire - 06/25/18 1444      Questionnaire   Patient Status  Refugee    Race  Asian    Location Patient Served At  [place]  Medicaid    Uninsured  Not Applicable    Food  No food insecurities    Housing/Utilities  Yes, have permanent housing    Transportation  No transportation needs    Interpersonal Safety  Yes, feel physically and emotionally safe where you currently live    Medication  No medication insecurities    Medical Provider  Yes    Referrals  Other    ED Visit Averted  Not Applicable    Life-Saving Intervention Made  Not Applicable       Brief visit to nurse office at [facility] to request appointment to confirm pregnancy. Used OTC pregnancy testing kit; possibly two weeks pregnant. Referred to [facility] for evaluation and follow-up.  Appointment scheduled 07-01-18 at 2 pm. Return prn. Jannetta Quint, RN/CN

## 2018-07-01 ENCOUNTER — Encounter: Payer: Self-pay | Admitting: Family Medicine

## 2018-07-01 ENCOUNTER — Other Ambulatory Visit (HOSPITAL_COMMUNITY)
Admission: RE | Admit: 2018-07-01 | Discharge: 2018-07-01 | Disposition: A | Discharge status: Home / Self Care | Payer: Medicaid Other | Source: Ambulatory Visit | Attending: Obstetrics & Gynecology | Admitting: Obstetrics & Gynecology

## 2018-07-01 ENCOUNTER — Ambulatory Visit (INDEPENDENT_AMBULATORY_CARE_PROVIDER_SITE_OTHER): Payer: Self-pay | Admitting: *Deleted

## 2018-07-01 DIAGNOSIS — O26891 Other specified pregnancy related conditions, first trimester: Secondary | ICD-10-CM | POA: Diagnosis present

## 2018-07-01 DIAGNOSIS — Z3A08 8 weeks gestation of pregnancy: Secondary | ICD-10-CM | POA: Diagnosis not present

## 2018-07-01 DIAGNOSIS — Z3201 Encounter for pregnancy test, result positive: Secondary | ICD-10-CM

## 2018-07-01 DIAGNOSIS — N898 Other specified noninflammatory disorders of vagina: Secondary | ICD-10-CM | POA: Insufficient documentation

## 2018-07-01 DIAGNOSIS — Z3200 Encounter for pregnancy test, result unknown: Secondary | ICD-10-CM

## 2018-07-01 LAB — POCT PREGNANCY, URINE: Preg Test, Ur: POSITIVE — AB

## 2018-07-01 NOTE — Progress Notes (Signed)
I have reviewed the chart and agree with nursing staff's documentation of this patient's encounter.  Kailea Dannemiller, CNM 07/01/2018 5:18 PM    

## 2018-07-01 NOTE — Progress Notes (Signed)
[employer] interpreter [phone] (Burmese) used for encounter to speak to pt husband who would then translate (Pw[place]aren) to pt. Pt speaks minimal Burmese and primarily speaks Pw[place]. Pt informed of +UPT today.  Unsure LMP - 05/05/18. EDD - 02/09/19   (8w 1d)  Pt will schedule prenatal care in this office. She reports having vaginal pain x5 days and white discharge with itching. Self swab performed for wet prep and GC/Chlamydia. Pt will be contacted if results abnormal. Pt also reports having upper back pain and abdominal gas pain. She denies nausea. I advised Tylenol and heating pad on low setting for back pain. She may take TUMS for her gas pain. These OTC medications were written on paper and given to husband. Pt and husband voiced understanding of all information and instructions given. Total time spent with pt = 50 minutes due to language barrier and multiple questions asked.

## 2018-07-02 LAB — CERVICOVAGINAL ANCILLARY ONLY
Bacterial vaginitis: NEGATIVE
CANDIDA VAGINITIS: NEGATIVE
CHLAMYDIA, DNA PROBE: NEGATIVE
Neisseria Gonorrhea: NEGATIVE
Trichomonas: NEGATIVE

## 2018-07-10 IMAGING — CR DG CHEST 2V
2 series · 2 of 2 positions shown · non-contrast
Comparison: None.

CLINICAL DATA: Tremors, numbness, body feels cold.

EXAM:
CHEST  2 VIEW

[w chest pa]
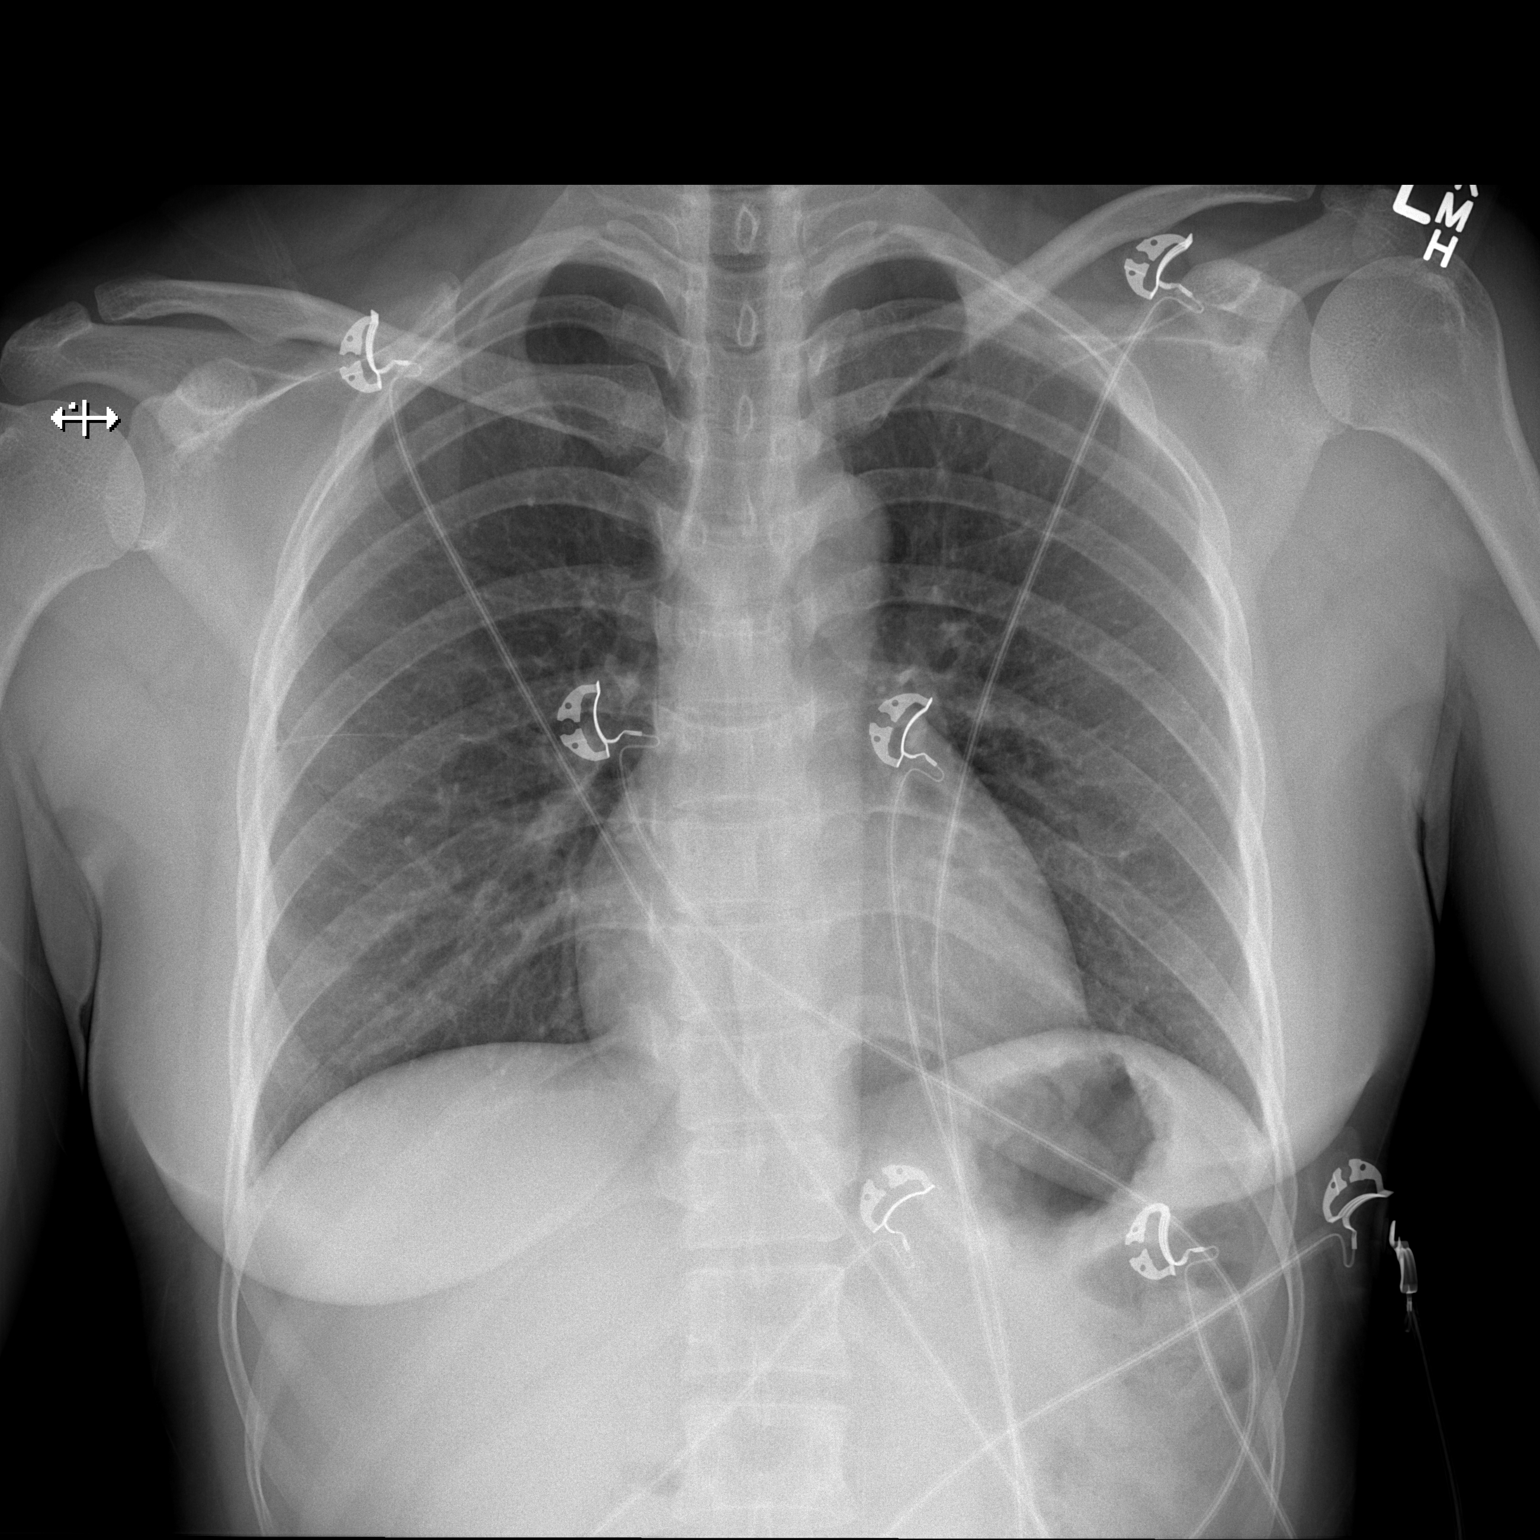

[w chest lat]
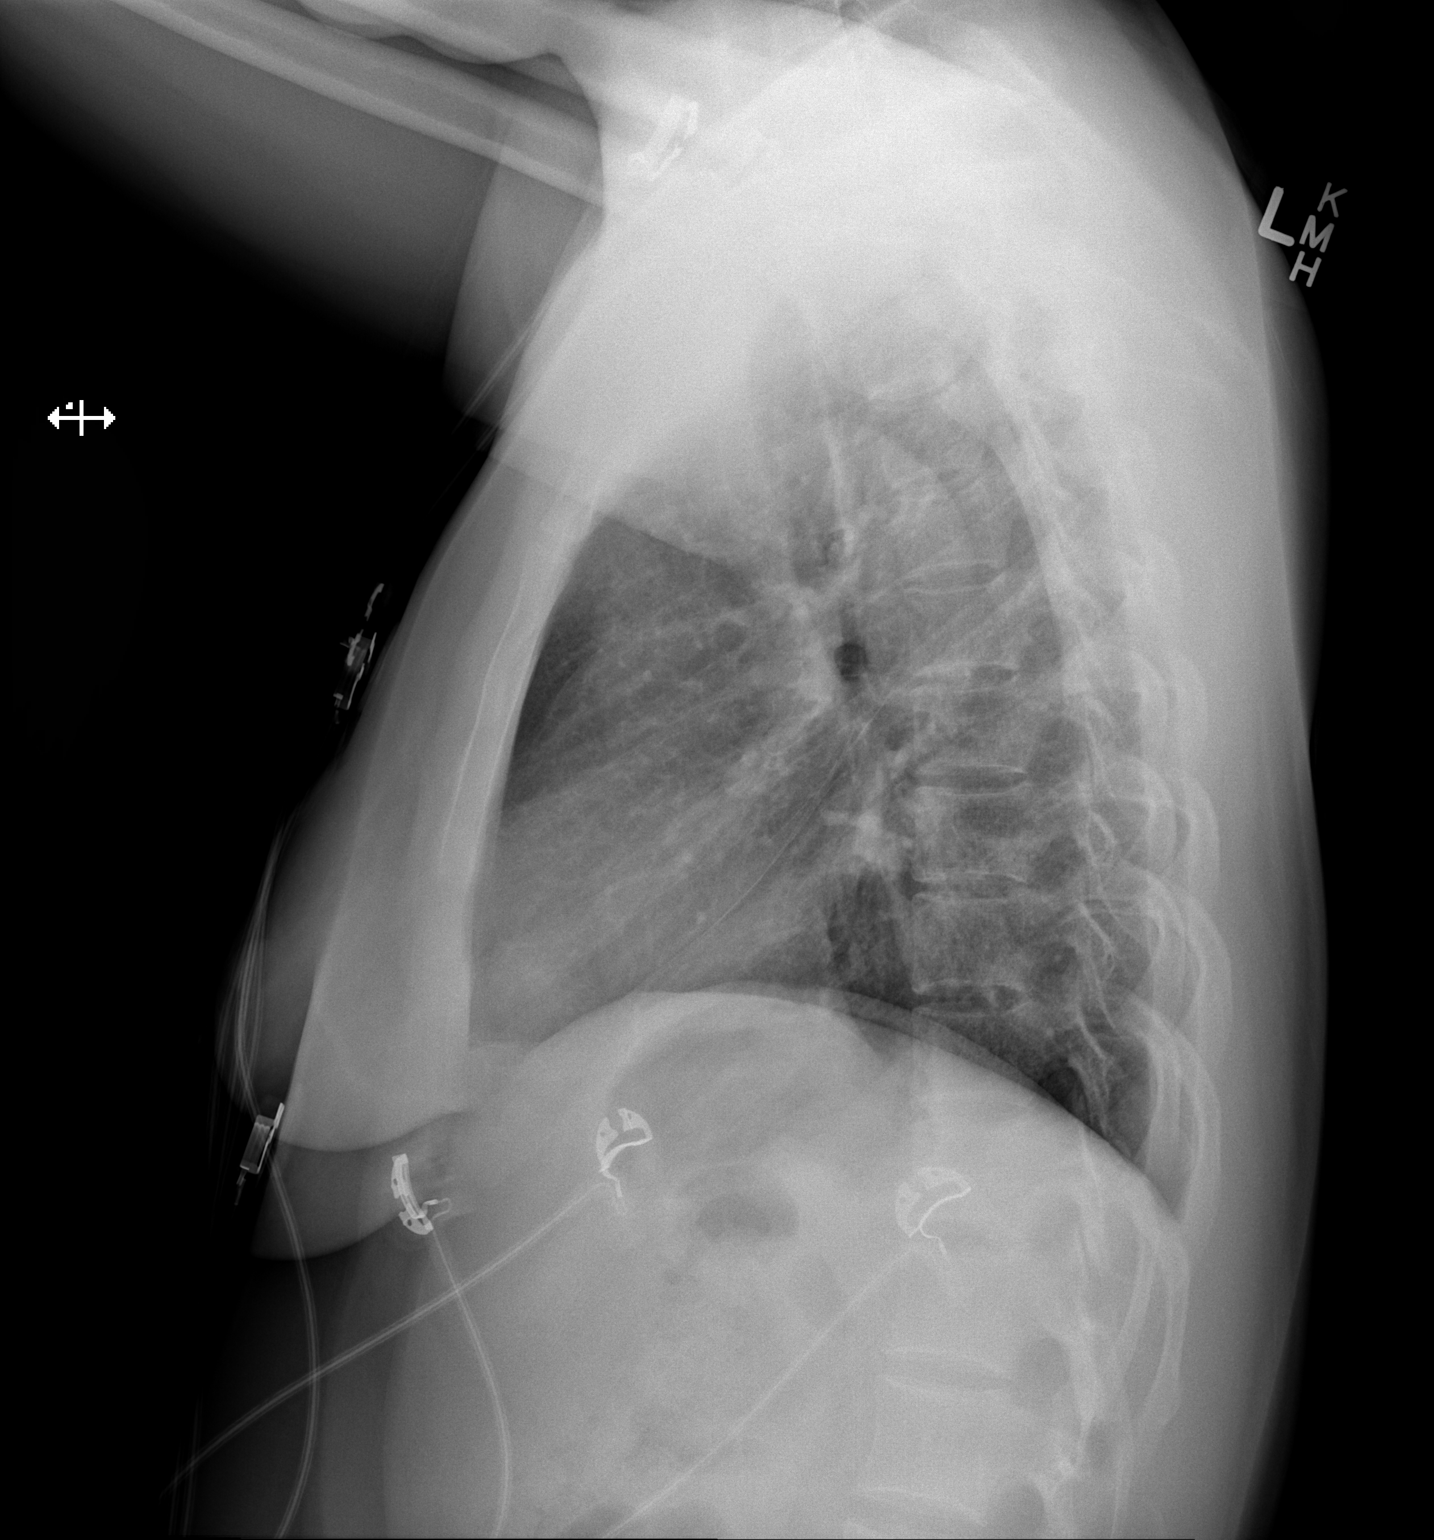

[2 of 2 positions shown; findings below may reference images not displayed]

FINDINGS: The heart size and mediastinal contours are within normal limits.
Both lungs are clear. The visualized skeletal structures are
unremarkable.
IMPRESSION: No active cardiopulmonary disease.

## 2018-07-19 NOTE — Congregational Nurse Program (Signed)
Congregational Nurse Program Note  Date of Encounter: 07/19/2018  Past Medical History: Past Medical History:  Diagnosis Date  . Medical history non-contributory     [name] here for blood pressure check. She has no other concerns. Teresa Cellaorothy Jacquel Redditt RN BSN CNP [phone]

## 2018-07-31 DIAGNOSIS — Z3480 Encounter for supervision of other normal pregnancy, unspecified trimester: Secondary | ICD-10-CM | POA: Insufficient documentation

## 2018-07-31 NOTE — Progress Notes (Signed)
Ob transv Subjective:    [name] is being seen today for her first obstetrical visit.  This is not a planned pregnancy, but is desired. She is at 12.[redacted] weeks gestation by uncertain LMP. No Ultrasounds this pregnancy.  Her obstetrical history is significant for nothing. Relationship with FOB: spouse, living together. Patient does intend to breast feed. Pregnancy history fully reviewed.  Patient reports no bleeding, no cramping and C/O neck tightness and pain.  Speaks [name]. [place] Interpreter used. Pt states she understands. Is able to answer questions appropriately w/ occasional clarification form spouse.   Review of Systems:   Review of Systems  Gastrointestinal: Negative for abdominal pain, nausea and vomiting.  Genitourinary: Negative for decreased urine volume and vaginal bleeding.  Musculoskeletal: Positive for neck pain.    Objective:     BP 107/76   Pulse 74   Wt 116 lb (52.6 kg)   LMP 05/04/2018 (Within Days)   BMI 23.43 kg/m  Physical Exam  Nursing note and vitals reviewed. Constitutional: She is oriented to person, place, and time. She appears well-developed and well-nourished. No distress.  Cardiovascular: Normal rate.  Respiratory: Effort normal. No respiratory distress.  GI: Soft. She exhibits no distension and no mass. There is no tenderness. There is no rebound and no guarding.  Fundus 2/SP  Genitourinary:  Genitourinary Comments: Deferred/declined  Musculoskeletal: Normal range of motion. She exhibits no edema or tenderness.  Neurological: She is alert and oriented to person, place, and time. She has normal reflexes.  Skin: Skin is warm and dry.  Psychiatric: She has a normal mood and affect.    Maternal Exam:  Abdomen: Patient reports no abdominal tenderness. Fundal height is 14.    Introitus: not evaluated.   Pelvis: Proven to 7 lb Cervix: not evaluated.   Fetal Exam Fetal Monitor Review: Mode: hand-held doppler probe.   Baseline rate: 161.          Assessment:    Pregnancy: [id number] Patient Active Problem List   Diagnosis Date Noted  . Supervision of other normal pregnancy, antepartum 07/31/2018  . Language barrier, cultural differences 02/01/2017       Plan:     1. Supervision of other normal pregnancy, antepartum  - OB Panel - Urine Culture - US OB Comp Less 14 Wks; Future  2. Language barrier, cultural differences  - US OB Comp Less 14 Wks; Future  3. Pregnancy with uncertain date of last menstrual period in first trimester, antepartum  - US OB Comp Less 14 Wks; Future  4. Twins!  - BS US done prior to leaving and pt noted to have twin gestation. Will order formal US to determine amnionicity or chorionicity   Initial labs drawn. Prenatal vitamins. Problem list reviewed and updated. Genetic screen discussed: declined. Role of ultrasound in pregnancy discussed; fetal survey: requested. Amniocentesis discussed: not indicated. Follow up in 4 weeks. Discussed clinic routines, schedule of care and testing, genetic screening options, involvement of students and residents under the direct supervision of APPs and doctors and presence of female providers. Pt verbalized understanding.   Teresa KinsmanVirginia Dencil Clark 08/01/2018

## 2018-07-31 NOTE — Patient Instructions (Addendum)
Multiple Pregnancy Having a multiple pregnancy means that a woman is carrying more than one baby at a time. She may be pregnant with twins, triplets, or more. The majority of multiple pregnancies are twins. Naturally conceiving triplets or more (higher-order multiples) is rare. Multiple pregnancies are riskier than single pregnancies. A woman with a multiple pregnancy is more likely to have certain problems during her pregnancy. Therefore, she will need to have more frequent appointments for prenatal care. How does a multiple pregnancy happen? A multiple pregnancy happens when:  The woman's body releases more than one egg at a time, and then each egg gets fertilized by a different sperm. ? This is the most common type of multiple pregnancy. ? Twins or other multiples produced this way are fraternal. They are no more alike than non-multiple siblings are.  One sperm fertilizes one egg, which then divides into more than one embryo. ? Twins or other multiples produced this way are identical. Identical multiples are always the same gender, and they look very much alike.  Who is most likely to have a multiple pregnancy? A multiple pregnancy is more likely to develop in women who:  Have had fertility treatment, especially if the treatment included fertility drugs.  Are older than 35 years of age.  Have already had four or more children.  Have a family history of multiple pregnancy.  How is a multiple pregnancy diagnosed? A multiple pregnancy may be diagnosed based on:  Symptoms such as: ? Rapid weight gain in the first 3 months of pregnancy (first trimester). ? More severe nausea and breast tenderness than what is typical of a single pregnancy. ? The uterus measuring larger than what is normal for the stage of the pregnancy.  Blood tests that detect a higher-than-normal level of human chorionic gonadotropin (hCG). This is a hormone that your body produces in early pregnancy.  Ultrasound  exam. This is used to confirm that you are carrying multiples.  What risks are associated with multiple pregnancy? A multiple pregnancy puts you at a higher risk for certain problems during or after your pregnancy, including:  Having your babies delivered before you have reached a full-term pregnancy (preterm birth). A full-term pregnancy lasts for at least 37 weeks. Babies born before 53 weeks may have a higher risk of a variety of health problems, such as breathing problems, feeding difficulties, cerebral palsy, and learning disabilities.  Diabetes.  Preeclampsia. This is a serious condition that causes high blood pressure along with other symptoms, such as swelling and headaches, during pregnancy.  Excessive blood loss after childbirth (postpartum hemorrhage).  Postpartum depression.  Low birth weight of the babies.  How will having a multiple pregnancy affect my care? Your health care provider will want to monitor you more closely during your pregnancy to make sure that your babies are growing normally and that you are healthy. Follow these instructions at home: Because your pregnancy is considered to be high risk, you will need to work closely with your health care team. You may also need to make some lifestyle changes. These may include the following: Eating and drinking  Increase your nutrition. ? Follow your health care provider's recommendations for weight gain. You may need to gain a little extra weight when you are pregnant with multiples. ? Eat healthy snacks often throughout the day. This can add calories and reduce nausea.  Drink enough fluid to keep your urine clear or pale yellow.  Take prenatal vitamins. Activity By 20-24 weeks, you may  need to limit your activities.  Avoid activities and work that take a lot of effort (are strenuous).  Ask your health care provider when you should stop having sexual intercourse.  Rest often.  General instructions  Do not use  any products that contain nicotine or tobacco, such as cigarettes and e-cigarettes. If you need help quitting, ask your health care provider.  Do not drink alcohol or use illegal drugs.  Take over-the-counter and prescription medicines only as told by your health care provider.  Arrange for extra help around the house.  Keep all follow-up visits and all prenatal visits as told by your health care provider. This is important. Contact a health care provider if:  You have dizziness.  You have persistent nausea, vomiting, or diarrhea.  You are having trouble gaining weight.  You have feelings of depression or other emotions that are interfering with your normal activities. Get help right away if:  You have a fever.  You have pain with urination.  You have fluid leaking from your vagina.  You have a bad-smelling vaginal discharge.  You notice increased swelling in your face, hands, legs, or ankles.  You have spotting or bleeding from your vagina.  You have pelvic cramps, pelvic pressure, or nagging pain in your abdomen or lower back.  You are having regular contractions.  You develop a severe headache, with or without visual changes.  You have shortness of breath or chest pain.  You notice less fetal movement, or no fetal movement. This information is not intended to replace advice given to you by your health care provider. Make sure you discuss any questions you have with your health care provider. Document Released: 09/12/2008 Document Revised: 08/04/2016 Document Reviewed: 08/04/2016 Elsevier Interactive Patient Education  2018 [employer].   How a Baby Grows During Pregnancy Pregnancy begins when a female's sperm enters a female's egg (fertilization). This happens in one of the tubes (fallopian tubes) that connect the ovaries to the womb (uterus). The fertilized egg is called an embryo until it reaches 10 weeks. From 10 weeks until birth, it is called a fetus. The  fertilized egg moves down the fallopian tube to the uterus. Then it implants into the lining of the uterus and begins to grow. The developing fetus receives oxygen and nutrients through the pregnant woman's bloodstream and the tissues that grow (placenta) to support the fetus. The placenta is the life support system for the fetus. It provides nutrition and removes waste. Learning as much as you can about your pregnancy and how your baby is developing can help you enjoy the experience. It can also make you aware of when there might be a problem and when to ask questions. How long does a typical pregnancy last? A pregnancy usually lasts 280 days, or about 40 weeks. Pregnancy is divided into three trimesters:  First trimester: 0-13 weeks.  Second trimester: 14-27 weeks.  Third trimester: 28-40 weeks.  The day when your baby is considered ready to be born (full term) is your estimated date of delivery. How does my baby develop month by month? First month  The fertilized egg attaches to the inside of the uterus.  Some cells will form the placenta. Others will form the fetus.  The arms, legs, brain, spinal cord, lungs, and heart begin to develop.  At the end of the first month, the heart begins to beat.  Second month  The bones, inner ear, eyelids, hands, and feet form.  The genitals develop.  By the end of 8 weeks, all major organs are developing.  Third month  All of the internal organs are forming.  Teeth develop below the gums.  Bones and muscles begin to grow. The spine can flex.  The skin is transparent.  Fingernails and toenails begin to form.  Arms and legs continue to grow longer, and hands and feet develop.  The fetus is about 3 in (7.6 cm) long.  Fourth month  The placenta is completely formed.  The external sex organs, neck, outer ear, eyebrows, eyelids, and fingernails are formed.  The fetus can hear, swallow, and move its arms and legs.  The kidneys  begin to produce urine.  The skin is covered with a white waxy coating (vernix) and very fine hair (lanugo).  Fifth month  The fetus moves around more and can be felt for the first time (quickening).  The fetus starts to sleep and wake up and may begin to suck its finger.  The nails grow to the end of the fingers.  The organ in the digestive system that makes bile (gallbladder) functions and helps to digest the nutrients.  If your baby is a girl, eggs are present in her ovaries. If your baby is a boy, testicles start to move down into his scrotum.  Sixth month  The lungs are formed, but the fetus is not yet able to breathe.  The eyes open. The brain continues to develop.  Your baby has fingerprints and toe prints. Your baby's hair grows thicker.  At the end of the second trimester, the fetus is about 9 in (22.9 cm) long.  Seventh month  The fetus kicks and stretches.  The eyes are developed enough to sense changes in light.  The hands can make a grasping motion.  The fetus responds to sound.  Eighth month  All organs and body systems are fully developed and functioning.  Bones harden and taste buds develop. The fetus may hiccup.  Certain areas of the brain are still developing. The skull remains soft.  Ninth month  The fetus gains about  lb (0.23 kg) each week.  The lungs are fully developed.  Patterns of sleep develop.  The fetus's head typically moves into a head-down position (vertex) in the uterus to prepare for birth. If the buttocks move into a vertex position instead, the baby is breech.  The fetus weighs 6-9 lbs (2.72-4.08 kg) and is 19-20 in (48.26-50.8 cm) long.  What can I do to have a healthy pregnancy and help my baby develop? Eating and Drinking  Eat a healthy diet. ? Talk with your health care provider to make sure that you are getting the nutrients that you and your baby need. ? Visit www.DisposableNylon.bechoosemyplate.gov to learn about creating a healthy  diet.  Gain a healthy amount of weight during pregnancy as advised by your health care provider. This is usually 25-35 pounds. You may need to: ? Gain more if you were underweight before getting pregnant or if you are pregnant with more than one baby. ? Gain less if you were overweight or obese when you got pregnant.  Medicines and Vitamins  Take prenatal vitamins as directed by your health care provider. These include vitamins such as folic acid, iron, calcium, and vitamin D. They are important for healthy development.  Take medicines only as directed by your health care provider. Read labels and ask a pharmacist or your health care provider whether over-the-counter medicines, supplements, and prescription drugs are safe to  take during pregnancy.  Activities  Be physically active as advised by your health care provider. Ask your health care provider to recommend activities that are safe for you to do, such as walking or swimming.  Do not participate in strenuous or extreme sports.  Lifestyle  Do not drink alcohol.  Do not use any tobacco products, including cigarettes, chewing tobacco, or electronic cigarettes. If you need help quitting, ask your health care provider.  Do not use illegal drugs.  Safety  Avoid exposure to mercury, lead, or other heavy metals. Ask your health care provider about common sources of these heavy metals.  Avoid listeria infection during pregnancy. Follow these precautions: ? Do not eat soft cheeses or deli meats. ? Do not eat hot dogs unless they have been warmed up to the point of steaming, such as in the microwave oven. ? Do not drink unpasteurized milk.  Avoid toxoplasmosis infection during pregnancy. Follow these precautions: ? Do not change your cat's litter box, if you have a cat. Ask someone else to do this for you. ? Wear gardening gloves while working in the yard.  General Instructions  Keep all follow-up visits as directed by your health  care provider. This is important. This includes prenatal care and screening tests.  Manage any chronic health conditions. Work closely with your health care provider to keep conditions, such as diabetes, under control.  How do I know if my baby is developing well? At each prenatal visit, your health care provider will do several different tests to check on your health and keep track of your baby's development. These include:  Fundal height. ? Your health care provider will measure your growing belly from top to bottom using a tape measure. ? Your health care provider will also feel your belly to determine your baby's position.  Heartbeat. ? An ultrasound in the first trimester can confirm pregnancy and show a heartbeat, depending on how far along you are. ? Your health care provider will check your baby's heart rate at every prenatal visit. ? As you get closer to your delivery date, you may have regular fetal heart rate monitoring to make sure that your baby is not in distress.  Second trimester ultrasound. ? This ultrasound checks your baby's development. It also indicates your baby's gender.  What should I do if I have concerns about my baby's development? Always talk with your health care provider about any concerns that you may have. This information is not intended to replace advice given to you by your health care provider. Make sure you discuss any questions you have with your health care provider. Document Released: 05/22/2008 Document Revised: 05/11/2016 Document Reviewed: 05/13/2014 Elsevier Interactive Patient Education  [employer]2018 Elsevier Inc.

## 2018-08-01 ENCOUNTER — Encounter: Payer: Self-pay | Admitting: Advanced Practice Midwife

## 2018-08-01 ENCOUNTER — Ambulatory Visit: Payer: Self-pay

## 2018-08-01 ENCOUNTER — Ambulatory Visit (INDEPENDENT_AMBULATORY_CARE_PROVIDER_SITE_OTHER): Payer: BLUE CROSS/BLUE SHIELD | Admitting: Advanced Practice Midwife

## 2018-08-01 VITALS — BP 107/76 | HR 74 | Wt 116.0 lb

## 2018-08-01 DIAGNOSIS — Z3687 Encounter for antenatal screening for uncertain dates: Secondary | ICD-10-CM

## 2018-08-01 DIAGNOSIS — Z3491 Encounter for supervision of normal pregnancy, unspecified, first trimester: Secondary | ICD-10-CM

## 2018-08-01 DIAGNOSIS — O09521 Supervision of elderly multigravida, first trimester: Secondary | ICD-10-CM

## 2018-08-01 DIAGNOSIS — O30001 Twin pregnancy, unspecified number of placenta and unspecified number of amniotic sacs, first trimester: Secondary | ICD-10-CM

## 2018-08-01 DIAGNOSIS — Z3480 Encounter for supervision of other normal pregnancy, unspecified trimester: Secondary | ICD-10-CM

## 2018-08-01 DIAGNOSIS — O09529 Supervision of elderly multigravida, unspecified trimester: Secondary | ICD-10-CM | POA: Insufficient documentation

## 2018-08-01 DIAGNOSIS — O30049 Twin pregnancy, dichorionic/diamniotic, unspecified trimester: Secondary | ICD-10-CM | POA: Insufficient documentation

## 2018-08-01 DIAGNOSIS — Z603 Acculturation difficulty: Secondary | ICD-10-CM

## 2018-08-01 DIAGNOSIS — Z3481 Encounter for supervision of other normal pregnancy, first trimester: Secondary | ICD-10-CM

## 2018-08-01 MED ORDER — FOLIVANE-OB 130-92.4-1 MG PO CAPS: 1.0000 | ORAL_CAPSULE | Freq: Every day | ORAL | 12 refills | Status: DC

## 2018-08-01 NOTE — Progress Notes (Signed)
Pt informed that the ultrasound is considered a limited OB ultrasound and is not intended to be a complete ultrasound exam.  Patient also informed that the ultrasound is not being completed with the intent of assessing for fetal or placental anomalies or any pelvic abnormalities.  Explained that the purpose of today's ultrasound is to assess for viability and dates.  Patient acknowledges the purpose of the exam and the limitations of the study.    Twin IUP observed. Cardiac activity and movement observed with both babies. See formal report.

## 2018-08-01 NOTE — Progress Notes (Deleted)
  Nursing Staff Provider  Office Location  [facility] Dating    Language  Burmeese Anatomy US    Flu Vaccine   Genetic Screen  NIPS:   AFP:   First Screen:  Quad:    TDaP vaccine    Hgb A1C or  GTT Early  Third trimester   Rhogam     LAB RESULTS   Feeding Plan  Blood Type     Contraception  Antibody    Circumcision  Rubella    Pediatrician   RPR     Support Person Sew (FOB) HBsAg     Prenatal Classes  HIV    BTL Consent  GBS  (For PCN allergy, check sensitivities)   VBAC Consent  Pap     Hgb Electro      CF     SMA     Waterbirth  [ ]  Class [ ]  Consent [ ]  CNM visit

## 2018-08-02 NOTE — Progress Notes (Signed)
I have reviewed this chart and agree with the RN/CMA assessment and management.    Kadelyn Dimascio C Ruthanne Mcneish, MD, FACOG Attending Physician, [facility]  

## 2018-08-04 LAB — URINE CULTURE, OB REFLEX

## 2018-08-04 LAB — CULTURE, OB URINE

## 2018-08-07 ENCOUNTER — Ambulatory Visit (INDEPENDENT_AMBULATORY_CARE_PROVIDER_SITE_OTHER): Payer: Self-pay

## 2018-08-07 ENCOUNTER — Ambulatory Visit (HOSPITAL_COMMUNITY)
Admission: RE | Admit: 2018-08-07 | Discharge: 2018-08-07 | Disposition: A | Discharge status: Home / Self Care | Payer: Medicaid Other | Source: Ambulatory Visit | Attending: Advanced Practice Midwife | Admitting: Advanced Practice Midwife

## 2018-08-07 DIAGNOSIS — Z3A12 12 weeks gestation of pregnancy: Secondary | ICD-10-CM | POA: Diagnosis not present

## 2018-08-07 DIAGNOSIS — Z712 Person consulting for explanation of examination or test findings: Secondary | ICD-10-CM

## 2018-08-07 DIAGNOSIS — O30001 Twin pregnancy, unspecified number of placenta and unspecified number of amniotic sacs, first trimester: Secondary | ICD-10-CM

## 2018-08-07 DIAGNOSIS — Z3481 Encounter for supervision of other normal pregnancy, first trimester: Secondary | ICD-10-CM | POA: Insufficient documentation

## 2018-08-07 DIAGNOSIS — Z3480 Encounter for supervision of other normal pregnancy, unspecified trimester: Secondary | ICD-10-CM

## 2018-08-07 DIAGNOSIS — O30041 Twin pregnancy, dichorionic/diamniotic, first trimester: Secondary | ICD-10-CM | POA: Insufficient documentation

## 2018-08-07 DIAGNOSIS — Z3491 Encounter for supervision of normal pregnancy, unspecified, first trimester: Secondary | ICD-10-CM

## 2018-08-07 DIAGNOSIS — Z603 Acculturation difficulty: Secondary | ICD-10-CM

## 2018-08-07 NOTE — Progress Notes (Signed)
Video Interperter # [id number] Pt here today for OB US results.  Notified Dr. Earlene Plateravis, pt  knows that she is pregnant with twins and the US was just a recommendation from pt last prenatal visit for dating.  Verified that pt knows about OB appt scheduled for 911/19.  Pt stated understanding.

## 2018-08-07 NOTE — Progress Notes (Signed)
I have reviewed this chart and agree with the RN/CMA assessment and management.    K. Meryl Davis, M.D. [facility]  

## 2018-08-08 LAB — OBSTETRIC PANEL, INCLUDING HIV
Antibody Screen: NEGATIVE
Basophils Absolute: 0 10*3/uL (ref 0.0–0.2)
Basos: 0 %
EOS (ABSOLUTE): 0.1 10*3/uL (ref 0.0–0.4)
EOS: 1 %
HEMOGLOBIN: 11.2 g/dL (ref 11.1–15.9)
HEP B S AG: NEGATIVE
HIV Screen 4th Generation wRfx: NONREACTIVE
Hematocrit: 33.7 % — ABNORMAL LOW (ref 34.0–46.6)
IMMATURE GRANULOCYTES: 0 %
Immature Grans (Abs): 0 10*3/uL (ref 0.0–0.1)
Lymphocytes Absolute: 2.5 10*3/uL (ref 0.7–3.1)
Lymphs: 25 %
MCH: 32.7 pg (ref 26.6–33.0)
MCHC: 33.2 g/dL (ref 31.5–35.7)
MCV: 99 fL — ABNORMAL HIGH (ref 79–97)
MONOCYTES: 6 %
Monocytes Absolute: 0.6 10*3/uL (ref 0.1–0.9)
NEUTROS PCT: 68 %
Neutrophils Absolute: 6.9 10*3/uL (ref 1.4–7.0)
Platelets: 259 10*3/uL (ref 150–450)
RBC: 3.42 x10E6/uL — ABNORMAL LOW (ref 3.77–5.28)
RDW: 13 % (ref 12.3–15.4)
RPR: NONREACTIVE
RUBELLA: 4.5 {index} (ref >0.99)
Rh Factor: POSITIVE
WBC: 10.1 10*3/uL (ref 3.4–10.8)

## 2018-08-08 LAB — HEMOGLOBINOPATHY EVALUATION
Ferritin: 98 ng/mL (ref 15–150)
HGB A2 QUANT: 2.3 % (ref 1.8–3.2)
HGB F QUANT: 0 % (ref 0.0–2.0)
HGB S: 0 %
Hgb A: 97.7 % (ref 96.4–98.8)
Hgb C: 0 %
Hgb Solubility: NEGATIVE
Hgb Variant: 0 %

## 2018-08-08 LAB — CYSTIC FIBROSIS GENE TEST

## 2018-08-09 ENCOUNTER — Encounter: Payer: Self-pay | Admitting: Advanced Practice Midwife

## 2018-08-12 LAB — SMN1 COPY NUMBER ANALYSIS (SMA CARRIER SCREENING)

## 2018-08-26 NOTE — Congregational Nurse Program (Signed)
Teresa Clark came in for BP check. She is pregnant with twins. Health education provided on health diet in pregnancy. Advised against second hand smoking. She is already on prenatal vitamins.  Nicole Cella Muhoro RN BSN PCCN CNP [phone]

## 2018-08-28 ENCOUNTER — Encounter: Payer: Medicaid Other | Admitting: Obstetrics and Gynecology

## 2018-08-30 ENCOUNTER — Ambulatory Visit (INDEPENDENT_AMBULATORY_CARE_PROVIDER_SITE_OTHER): Payer: Medicaid Other | Admitting: Obstetrics & Gynecology

## 2018-08-30 VITALS — BP 107/64 | HR 74 | Wt 121.4 lb

## 2018-08-30 DIAGNOSIS — Z3480 Encounter for supervision of other normal pregnancy, unspecified trimester: Secondary | ICD-10-CM

## 2018-08-30 DIAGNOSIS — O09522 Supervision of elderly multigravida, second trimester: Secondary | ICD-10-CM

## 2018-08-30 DIAGNOSIS — Z603 Acculturation difficulty: Secondary | ICD-10-CM

## 2018-08-30 DIAGNOSIS — O30049 Twin pregnancy, dichorionic/diamniotic, unspecified trimester: Secondary | ICD-10-CM

## 2018-09-09 ENCOUNTER — Encounter (HOSPITAL_COMMUNITY): Payer: Self-pay

## 2018-09-16 ENCOUNTER — Ambulatory Visit (HOSPITAL_COMMUNITY)
Admission: RE | Admit: 2018-09-16 | Discharge: 2018-09-16 | Disposition: A | Discharge status: Home / Self Care | Payer: BLUE CROSS/BLUE SHIELD | Source: Ambulatory Visit | Attending: Obstetrics & Gynecology | Admitting: Obstetrics & Gynecology

## 2018-09-16 ENCOUNTER — Other Ambulatory Visit (HOSPITAL_COMMUNITY): Payer: Self-pay | Admitting: *Deleted

## 2018-09-16 ENCOUNTER — Other Ambulatory Visit: Payer: Self-pay | Admitting: Obstetrics & Gynecology

## 2018-09-16 ENCOUNTER — Other Ambulatory Visit: Payer: Self-pay | Admitting: *Deleted

## 2018-09-16 DIAGNOSIS — Z3A18 18 weeks gestation of pregnancy: Secondary | ICD-10-CM | POA: Diagnosis not present

## 2018-09-16 DIAGNOSIS — Z3480 Encounter for supervision of other normal pregnancy, unspecified trimester: Secondary | ICD-10-CM

## 2018-09-16 DIAGNOSIS — O09522 Supervision of elderly multigravida, second trimester: Secondary | ICD-10-CM | POA: Diagnosis not present

## 2018-09-16 DIAGNOSIS — Z363 Encounter for antenatal screening for malformations: Secondary | ICD-10-CM | POA: Diagnosis not present

## 2018-09-16 DIAGNOSIS — Z3A19 19 weeks gestation of pregnancy: Secondary | ICD-10-CM

## 2018-09-16 DIAGNOSIS — O30042 Twin pregnancy, dichorionic/diamniotic, second trimester: Secondary | ICD-10-CM | POA: Insufficient documentation

## 2018-09-27 ENCOUNTER — Ambulatory Visit (INDEPENDENT_AMBULATORY_CARE_PROVIDER_SITE_OTHER): Payer: Self-pay | Admitting: Obstetrics and Gynecology

## 2018-09-27 ENCOUNTER — Encounter: Payer: Self-pay | Admitting: Obstetrics and Gynecology

## 2018-09-27 VITALS — BP 109/68 | HR 80 | Wt 124.1 lb

## 2018-09-27 DIAGNOSIS — Z603 Acculturation difficulty: Secondary | ICD-10-CM

## 2018-09-27 DIAGNOSIS — O30042 Twin pregnancy, dichorionic/diamniotic, second trimester: Secondary | ICD-10-CM

## 2018-09-27 DIAGNOSIS — O09522 Supervision of elderly multigravida, second trimester: Secondary | ICD-10-CM

## 2018-09-27 DIAGNOSIS — Z3482 Encounter for supervision of other normal pregnancy, second trimester: Secondary | ICD-10-CM

## 2018-09-27 DIAGNOSIS — O30049 Twin pregnancy, dichorionic/diamniotic, unspecified trimester: Secondary | ICD-10-CM

## 2018-09-27 DIAGNOSIS — Z3480 Encounter for supervision of other normal pregnancy, unspecified trimester: Secondary | ICD-10-CM

## 2018-09-27 NOTE — Progress Notes (Signed)
   PRENATAL VISIT NOTE  Subjective:  Teresa Clark is a 37 y.o. G4P3003 at [redacted]w[redacted]d being seen today for ongoing prenatal care.  She is currently monitored for the following issues for this high-risk pregnancy and has Language barrier, cultural differences; Supervision of other normal pregnancy, antepartum; Dichorionic diamniotic twin pregnancy, antepartum; Pregnancy with uncertain date of last menstrual period in first trimester, antepartum; and AMA (advanced maternal age) multigravida 35+ on their problem list.  Patient reports no complaints.  Contractions: Not present. Vag. Bleeding: None.  Movement: Present. Denies leaking of fluid.   The following portions of the patient's history were reviewed and updated as appropriate: allergies, current medications, past family history, past medical history, past social history, past surgical history and problem list. Problem list updated.  Objective:   Vitals:   09/27/18 1127  BP: 109/68  Pulse: 80  Weight: 124 lb 1.6 oz (56.3 kg)    Fetal Status: Fetal Heart Rate (bpm): 160/153   Movement: Present     General:  Alert, oriented and cooperative. Patient is in no acute distress.  Skin: Skin is warm and dry. No rash noted.   Cardiovascular: Normal heart rate noted  Respiratory: Normal respiratory effort, no problems with respiration noted  Abdomen: Soft, gravid, appropriate for gestational age.  Pain/Pressure: Absent     Pelvic: Cervical exam deferred        Extremities: Normal range of motion.  Edema: None  Mental Status: Normal mood and affect. Normal behavior. Normal judgment and thought content.   Pt informed that the ultrasound is considered a limited OB ultrasound and is not intended to be a complete ultrasound exam.  Patient also informed that the ultrasound is not being completed with the intent of assessing for fetal or placental anomalies or any pelvic abnormalities.  Explained that the purpose of today's ultrasound is to assess for  FHR.   Patient acknowledges the purpose of the exam and the limitations of the study.    Bedside US: lower twin, FHR 153 bpm,. Active movement seen, upper twin FHR 160 bpm, active movement seen, grossly normal fluid both twins  Assessment and Plan:  Pregnancy: G4P3003 at [redacted]w[redacted]d  1. Supervision of other normal pregnancy, antepartum Undecided about contraception  2. Dichorionic diamniotic twin pregnancy, antepartum Last discordance < 20 %, growth wnl F/u growth scheduled  3. Language barrier, cultural differences Live Pharmacist, community used  4. Multigravida of advanced maternal age in second trimester   Preterm labor symptoms and general obstetric precautions including but not limited to vaginal bleeding, contractions, leaking of fluid and fetal movement were reviewed in detail with the patient. Please refer to After Visit Summary for other counseling recommendations.  Return in about 2 weeks (around 10/11/2018).  Future Appointments  Date Time Provider Department Center  10/14/2018 10:15 AM Gwenevere Abbot, MD [facility]  10/21/2018  9:00 AM [facility] [place] 3 [facility]    Conan Bowens, MD

## 2018-10-14 ENCOUNTER — Ambulatory Visit (INDEPENDENT_AMBULATORY_CARE_PROVIDER_SITE_OTHER): Payer: Self-pay | Admitting: Family Medicine

## 2018-10-14 VITALS — BP 105/61 | HR 75 | Temp 73.4°F | Wt 126.8 lb

## 2018-10-14 DIAGNOSIS — G479 Sleep disorder, unspecified: Secondary | ICD-10-CM

## 2018-10-14 DIAGNOSIS — O30049 Twin pregnancy, dichorionic/diamniotic, unspecified trimester: Secondary | ICD-10-CM

## 2018-10-14 DIAGNOSIS — O219 Vomiting of pregnancy, unspecified: Secondary | ICD-10-CM

## 2018-10-14 MED ORDER — DOXYLAMINE SUCCINATE (SLEEP) 25 MG PO TABS: 25.0000 mg | ORAL_TABLET | Freq: Every evening | ORAL | 3 refills | Status: DC | PRN

## 2018-10-14 MED ORDER — VITAMIN B-6 25 MG PO TABS: 25.0000 mg | ORAL_TABLET | Freq: Every day | ORAL | 3 refills | Status: DC

## 2018-10-14 NOTE — Progress Notes (Signed)
   PRENATAL VISIT NOTE  Subjective:  Teresa Clark is a 37 y.o. G4P3003 at [redacted]w[redacted]d being seen today for ongoing prenatal care.  She is currently monitored for the following issues for this high-risk pregnancy and has Language barrier, cultural differences; Supervision of other normal pregnancy, antepartum; Dichorionic diamniotic twin pregnancy, antepartum; Pregnancy with uncertain date of last menstrual period in first trimester, antepartum; and AMA (advanced maternal age) multigravida 35+ on their problem list.  Patient reports nausea and difficulty sleeping. She reports that she's had nausea, especially when taking her prenatal vitamins. Also, they make it difficult for her to sleep. She has been taking them in the evening. Denies constipation. Not currently taking any medication for nausea or sleep.    Contractions: Not present. Vag. Bleeding: None.  Movement: Present. Denies leaking of fluid.   The following portions of the patient's history were reviewed and updated as appropriate: allergies, current medications, past family history, past medical history, past social history, past surgical history and problem list. Problem list updated.  Objective:   Vitals:   10/14/18 1043  BP: 105/61  Pulse: 75  Temp: (!) 73.4 F (23 C)  Weight: 126 lb 12.8 oz (57.5 kg)    Fetal Status: Fetal Heart Rate (bpm): 154/145   Movement: Present     General:  Alert, oriented and cooperative. Patient is in no acute distress.  Skin: Skin is warm and dry. No rash noted.   Cardiovascular: Normal heart rate noted  Respiratory: Normal respiratory effort, no problems with respiration noted  Abdomen: Soft, gravid, appropriate for gestational age.  Pain/Pressure: Absent     Pelvic: Cervical exam deferred        Extremities: Normal range of motion.  Edema: None  Mental Status: Normal mood and affect. Normal behavior. Normal judgment and thought content.   Assessment and Plan:  Pregnancy: G4P3003 at [redacted]w[redacted]d  1.  Dichorionic diamniotic twin pregnancy, antepartum - discussed need for Q4w [place] - discussed circumcision, which pt declines   2. Nausea and vomiting during pregnancy - unisom/B6 combo prescribed  3. Difficulty sleeping - unisom prescribed   Preterm labor symptoms and general obstetric precautions including but not limited to vaginal bleeding, contractions, leaking of fluid and fetal movement were reviewed in detail with the patient. Please refer to After Visit Summary for other counseling recommendations.  Return in about 4 weeks (around 11/11/2018) for ROB.  Future Appointments  Date Time Provider Department Center  10/21/2018  9:00 AM [facility] [place] 3 [facility]  11/11/2018  9:35 AM Teresa Phenix, MD [facility]    Gwenevere Abbot, MD

## 2018-10-14 NOTE — Patient Instructions (Addendum)
Second Trimester of Pregnancy The second trimester is from week 13 through week 28, month 4 through 6. This is often the time in pregnancy that you feel your best. Often times, morning sickness has lessened or quit. You may have more energy, and you may get hungry more often. Your unborn baby (fetus) is growing rapidly. At the end of the sixth month, he or she is about 9 inches long and weighs about 1 pounds. You will likely feel the baby move (quickening) between 18 and 20 weeks of pregnancy.  Research childbirth classes and hospital preregistration at [email or web address]  Follow these instructions at home:  Avoid all smoking, herbs, and alcohol. Avoid drugs not approved by your doctor.  Do not use any tobacco products, including cigarettes, chewing tobacco, and electronic cigarettes. If you need help quitting, ask your doctor. You may get counseling or other support to help you quit.  Only take medicine as told by your doctor. Some medicines are safe and some are not during pregnancy.  Exercise only as told by your doctor. Stop exercising if you start having cramps.  Eat regular, healthy meals.  Wear a good support bra if your breasts are tender.  Do not use hot tubs, steam rooms, or saunas.  Wear your seat belt when driving.  Avoid raw meat, uncooked cheese, and liter boxes and soil used by cats.  Take your prenatal vitamins.  Take 1500-2000 milligrams of calcium daily starting at the 20th week of pregnancy until you deliver your baby.  Try taking medicine that helps you poop (stool softener) as needed, and if your doctor approves. Eat more fiber by eating fresh fruit, vegetables, and whole grains. Drink enough fluids to keep your pee (urine) clear or pale yellow.  Take warm water baths (sitz baths) to soothe pain or discomfort caused by hemorrhoids. Use hemorrhoid cream if your doctor approves.  If you have puffy, bulging veins (varicose veins), wear support hose. Raise  (elevate) your feet for 15 minutes, 3-4 times a day. Limit salt in your diet.  Avoid heavy lifting, wear low heals, and sit up straight.  Rest with your legs raised if you have leg cramps or low back pain.  Visit your dentist if you have not gone during your pregnancy. Use a soft toothbrush to brush your teeth. Be gentle when you floss.  You can have sex (intercourse) unless your doctor tells you not to.  Go to your doctor visits.  Get help if:  You feel dizzy.  You have mild cramps or pressure in your lower belly (abdomen).  You have a nagging pain in your belly area.  You continue to feel sick to your stomach (nauseous), throw up (vomit), or have watery poop (diarrhea).  You have bad smelling fluid coming from your vagina.  You have pain with peeing (urination). Get help right away if:  You have a fever.  You are leaking fluid from your vagina.  You have spotting or bleeding from your vagina.  You have severe belly cramping or pain.  You lose or gain weight rapidly.  You have trouble catching your breath and have chest pain.  You notice sudden or extreme puffiness (swelling) of your face, hands, ankles, feet, or legs.  You have not felt the baby move in over an hour.  You have severe headaches that do not go away with medicine.  You have vision changes. This information is not intended to replace advice given to you by your health care provider. Make   sure you discuss any questions you have with your health care provider. Document Released: 02/28/2010 Document Revised: 05/11/2016 Document Reviewed: 02/04/2013 Elsevier Interactive Patient Education  2017 [employer].  Breastfeeding Twins or Multiples Choosing to breastfeed your babies has many benefits:  It helps your uterus return to its original size faster.  It releases hormones that relax you.  It saves money and time. Your milk is available at the right temperature and whenever your babies are ready to  feed.  It ensures your babies get the best nutrition.  It creates a unique bond between you and each of your babies.  Breast milk is beneficial for all babies, but it is especially beneficial to multiples, who are often small at birth and need all the advantages breast milk can provide. Mothers of multiples typically produce enough milk for all their babies. When should I start breastfeeding? Nurse as soon as possible and as often as your babies want to be nursed. This will cause your body to produce enough milk for all your babies. Work with a Advertising copywriter as soon as possible. It is important to assess each infant at the breast to make sure they can latch and feed. If your babies were born prematurely and are unable to nurse, you can pump your breasts and freeze the milk until your babies are ready to feed at the breast. To make sure that your body makes enough milk, empty your breast at least 8-10 times in a 24-hour period. Ask a lactation specialist to help you choose an effective breast pump and for guidance in helping your babies latch on to and feed from the breast when they are ready. Should I nurse my babies together? It is up to you whether you nurse your babies together or separately. However, many mothers of multiples find that it is easier and time-saving to nurse two babies at the same time. Nursing babies together may also help establish your milk supply. Nursing two babies at the same time can be tricky at first, but it often gets easier as the babies get older and more experienced at latching on to the breast. When nursing your babies together:  Ask your nurse or lactation specialist to suggest tips on positioning. There are several positions and holds that make it easier to nurse more than one baby at a time.  Try placing pillows under your arms and legs and under your babies for comfort. Use a nursing pillow specially designed for multiples.  Switch your babies from one  side to the other at alternate feedings. For example, if baby A feeds from the right breast and baby B feeds from the left breast, then at the next feeding baby A should take the left breast and baby B the right breast. This ensures that both breasts get equal amounts of stimulation. It also allows the stronger sucking baby to increase the milk supply for the baby whose suck is weaker.  What are some tips to increase my success?  A good latch helps the babies empty the breasts. It also prevents sore nipples. If you are nursing babies together and one of the babies is having difficulty latching or sucking, try nursing that baby separately. That way you can give her or him your full attention.  If you are nursing babies separately and one of the babies is having difficulty feeding, it may help to nurse that baby and his or her sibling together. The baby with the stronger or more effective suck  will stimulate the mother's milk to flow faster.  Try not to give your babies bottles and pacifiers during the early weeks of breastfeeding. Avoiding these encourages effective sucking patterns and helps establish a good milk supply. You should not need supplemental feedings if you empty your breasts with each feeding.  Keep track of each baby's stools and wet diapers for the first 6 weeks to make sure each baby is getting enough milk. Signs that your babies are getting enough milk include: ? Wetting at least 1-2 diapers in the first 24 hours after birth. ? Wetting at least 5-6 diapers every 24 hours for the first week after birth. The urine should be clear and pale yellow by 5 days after birth. ? Wetting 6-8 diapers every 24 hours as your babies grow and develop. ? Producing a healthy amount of stool:  By the time your babies are 54 days old, they should produce at least 3 stools in a 24-hour period. The stools should be soft and yellow.  By the time your babies are 5 days old, they should produce at least 3  stools in a 24-hour period. The stools should be seedy and yellow. ? Gaining a healthy amount of weight. Talk to your health care provider about how much weight your babies should be gaining.  If your babies do not get enough milk from breastfeeding alone, talk with a lactation specialist. It may be possible to breastfeed part of the time and supplement feedings with donated milk or formula.  Drink plenty of fluids so your urine is clear or pale yellow.  Eat a healthy diet that includes fresh fruits and vegetables, whole grains, lean meat, fish, eggs, beans, nuts, and seeds, and low-fat dairy products. Summary  Breastfeeding is beneficial for twins, multiples, and their mothers.  Nurse your babies as soon as possible after birth. You may nurse two babies at one time.  Work with a lactation specialist to assess your babies' latches, find positioning and breastfeeding strategies that work for you, and overcome any nursing challenges. This information is not intended to replace advice given to you by your health care provider. Make sure you discuss any questions you have with your health care provider. Document Released: 04/03/2005 Document Revised: 12/06/2016 Document Reviewed: 12/06/2016 Elsevier Interactive Patient Education  2017 [employer].

## 2018-10-21 ENCOUNTER — Encounter (HOSPITAL_COMMUNITY): Payer: Self-pay

## 2018-10-21 ENCOUNTER — Ambulatory Visit (HOSPITAL_COMMUNITY)
Admission: RE | Admit: 2018-10-21 | Discharge: 2018-10-21 | Disposition: A | Discharge status: Home / Self Care | Payer: BLUE CROSS/BLUE SHIELD | Source: Ambulatory Visit | Attending: Obstetrics & Gynecology | Admitting: Obstetrics & Gynecology

## 2018-10-21 ENCOUNTER — Other Ambulatory Visit (HOSPITAL_COMMUNITY): Payer: Self-pay | Admitting: *Deleted

## 2018-10-21 DIAGNOSIS — Z3A23 23 weeks gestation of pregnancy: Secondary | ICD-10-CM | POA: Diagnosis not present

## 2018-10-21 DIAGNOSIS — O30042 Twin pregnancy, dichorionic/diamniotic, second trimester: Secondary | ICD-10-CM

## 2018-10-21 DIAGNOSIS — Z362 Encounter for other antenatal screening follow-up: Secondary | ICD-10-CM

## 2018-10-21 DIAGNOSIS — O30049 Twin pregnancy, dichorionic/diamniotic, unspecified trimester: Secondary | ICD-10-CM

## 2018-10-21 DIAGNOSIS — O09522 Supervision of elderly multigravida, second trimester: Secondary | ICD-10-CM | POA: Diagnosis not present

## 2018-11-11 ENCOUNTER — Ambulatory Visit (INDEPENDENT_AMBULATORY_CARE_PROVIDER_SITE_OTHER): Payer: BLUE CROSS/BLUE SHIELD | Admitting: Obstetrics & Gynecology

## 2018-11-11 DIAGNOSIS — Z3480 Encounter for supervision of other normal pregnancy, unspecified trimester: Secondary | ICD-10-CM

## 2018-11-11 DIAGNOSIS — Z3A26 26 weeks gestation of pregnancy: Secondary | ICD-10-CM

## 2018-11-11 DIAGNOSIS — Z3482 Encounter for supervision of other normal pregnancy, second trimester: Secondary | ICD-10-CM

## 2018-11-11 NOTE — Patient Instructions (Signed)
What You Need to Know About Female Sterilization Female sterilization is surgery to prevent pregnancy. In this surgery, the fallopian tubes are either blocked or closed off. This prevents eggs from reaching the uterus so that the eggs cannot be fertilized by sperm and you cannot get pregnant. Sterilization is permanent. It should only be done if you are sure that you do not want to be able to have children. What are the sterilization surgery options? There are several kinds of female sterilization surgeries. They include:  Laparoscopic tubal ligation. In this surgery, the fallopian tubes are tied off, sealed with heat, or blocked with a clip, ring, or clamp. A small portion of each fallopian tube may also be removed. This surgery is done through several small cuts (incisions).  Postpartum tubal ligation. This is also called a mini-laparotomy. This surgery is done right after childbirth or 1 or 2 days after childbirth. In this surgery, the fallopian tubes are tied off, sealed with heat, or blocked with a clip, ring, or clamp. A small portion of each fallopian tube may also be removed. The surgery is done through a single incision.  Hysteroscopic sterilization. In this surgery, a tiny, spring-like coil is inserted through the cervix and uterus into the fallopian tubes. The coil causes scarring, which blocks the tubes. After the surgery, contraception should be used for 3 months to allow the scar tissue to form completely.  Is sterilization safe? Generally, sterilization is safe. Complications are rare. However, there are risks. They include:  Bleeding.  Infection.  Reaction to medicine used during the procedure.  Injury to surrounding organs.  Failure of the procedure.  How effective is sterilization? Sterilization is nearly 100% effective, but it can fail. Also, the fallopian tubes can grow back together over time. If this happens, you will be able to get pregnant again. Women who have had  this procedure have a higher chance of having an ectopic pregnancy. An ectopic pregnancy is a pregnancy that happens outside of the uterus. This kind of pregnancy is unsuccessful and can lead to serious bleeding if it is not treated. What are the benefits?  It is usually effective for a lifetime.  It is usually safe.  It does not have the drawbacks of other types of birth control: That means: ? Your hormones are not affected. Because of this, your menstrual periods, sexual desire, and sexual performance will not be affected. ? There are no side effects. What are the drawbacks?  If you change your mind and decide that you want to have children, you may not be able to. Sterilization may be reversed, but a reversal is not always successful.  It does not provide protection against STDs (sexually transmitted diseases).  It increases the chance of having an ectopic pregnancy. This information is not intended to replace advice given to you by your health care provider. Make sure you discuss any questions you have with your health care provider. Document Released: 05/22/2008 Document Revised: 07/27/2016 Document Reviewed: 08/31/2015 Elsevier Interactive Patient Education  2018 Elsevier Inc.  

## 2018-11-11 NOTE — Progress Notes (Signed)
   PRENATAL VISIT NOTE  Subjective:  Teresa Clark is a 37 y.o. [id number] at 4513w2d being seen today for ongoing prenatal care.  She is currently monitored for the following issues for this high-risk pregnancy and has Language barrier, cultural differences; Supervision of other normal pregnancy, antepartum; Dichorionic diamniotic twin pregnancy, antepartum; Pregnancy with uncertain date of last menstrual period in first trimester, antepartum; and AMA (advanced maternal age) multigravida 35+ on their problem list.  Patient reports low abdominal pressure.  Contractions: Not present. Vag. Bleeding: None.  Movement: Present. Denies leaking of fluid.   The following portions of the patient's history were reviewed and updated as appropriate: allergies, current medications, past family history, past medical history, past social history, past surgical history and problem list. Problem list updated.  Objective:   Vitals:   11/11/18 0959  BP: 103/72  Pulse: 84  Weight: 59.5 kg    Fetal Status: Fetal Heart Rate (bpm): 151/146   Movement: Present     General:  Alert, oriented and cooperative. Patient is in no acute distress.  Skin: Skin is warm and dry. No rash noted.   Cardiovascular: Normal heart rate noted  Respiratory: Normal respiratory effort, no problems with respiration noted  Abdomen: Soft, gravid, appropriate for gestational age.  Pain/Pressure: Absent     Pelvic: Cervical exam deferred        Extremities: Normal range of motion.  Edema: None  Mental Status: Normal mood and affect. Normal behavior. Normal judgment and thought content.   Assessment and Plan:  Pregnancy: [id number] at 7013w2d  There are no diagnoses linked to this encounter. Preterm labor symptoms and general obstetric precautions including but not limited to vaginal bleeding, contractions, leaking of fluid and fetal movement were reviewed in detail with the patient. Please refer to After Visit Summary for other counseling  recommendations.  Return in about 2 weeks (around 11/25/2018) for 2 hr.  Future Appointments  Date Time Provider Department Center  11/18/2018  9:45 AM [facility] US 2 [facility]    Scheryl DarterJames Arnold, MD

## 2018-11-13 ENCOUNTER — Encounter (HOSPITAL_COMMUNITY): Payer: Self-pay

## 2018-11-18 ENCOUNTER — Encounter (HOSPITAL_COMMUNITY): Payer: Self-pay

## 2018-11-18 ENCOUNTER — Other Ambulatory Visit (HOSPITAL_COMMUNITY): Payer: Self-pay | Admitting: *Deleted

## 2018-11-18 ENCOUNTER — Ambulatory Visit (HOSPITAL_COMMUNITY)
Admission: RE | Admit: 2018-11-18 | Discharge: 2018-11-18 | Disposition: A | Discharge status: Home / Self Care | Payer: BLUE CROSS/BLUE SHIELD | Source: Ambulatory Visit | Attending: Obstetrics & Gynecology | Admitting: Obstetrics & Gynecology

## 2018-11-18 DIAGNOSIS — O30042 Twin pregnancy, dichorionic/diamniotic, second trimester: Secondary | ICD-10-CM | POA: Insufficient documentation

## 2018-11-18 DIAGNOSIS — O09522 Supervision of elderly multigravida, second trimester: Secondary | ICD-10-CM | POA: Diagnosis not present

## 2018-11-18 DIAGNOSIS — O30049 Twin pregnancy, dichorionic/diamniotic, unspecified trimester: Secondary | ICD-10-CM

## 2018-11-18 DIAGNOSIS — O30043 Twin pregnancy, dichorionic/diamniotic, third trimester: Secondary | ICD-10-CM

## 2018-11-18 DIAGNOSIS — Z3A27 27 weeks gestation of pregnancy: Secondary | ICD-10-CM | POA: Insufficient documentation

## 2018-11-18 DIAGNOSIS — Z362 Encounter for other antenatal screening follow-up: Secondary | ICD-10-CM | POA: Insufficient documentation

## 2018-11-19 ENCOUNTER — Other Ambulatory Visit (HOSPITAL_COMMUNITY): Payer: Self-pay | Admitting: *Deleted

## 2018-11-19 DIAGNOSIS — O30049 Twin pregnancy, dichorionic/diamniotic, unspecified trimester: Secondary | ICD-10-CM

## 2018-11-27 ENCOUNTER — Encounter: Payer: Self-pay | Admitting: Obstetrics and Gynecology

## 2018-11-27 ENCOUNTER — Ambulatory Visit (INDEPENDENT_AMBULATORY_CARE_PROVIDER_SITE_OTHER): Payer: BLUE CROSS/BLUE SHIELD | Admitting: Obstetrics and Gynecology

## 2018-11-27 VITALS — BP 100/63 | HR 72 | Wt 133.2 lb

## 2018-11-27 DIAGNOSIS — Z3480 Encounter for supervision of other normal pregnancy, unspecified trimester: Secondary | ICD-10-CM

## 2018-11-27 DIAGNOSIS — Z3483 Encounter for supervision of other normal pregnancy, third trimester: Secondary | ICD-10-CM

## 2018-11-27 DIAGNOSIS — Z23 Encounter for immunization: Secondary | ICD-10-CM | POA: Diagnosis not present

## 2018-11-27 DIAGNOSIS — O30049 Twin pregnancy, dichorionic/diamniotic, unspecified trimester: Secondary | ICD-10-CM

## 2018-11-27 DIAGNOSIS — Z603 Acculturation difficulty: Secondary | ICD-10-CM

## 2018-11-27 DIAGNOSIS — O09523 Supervision of elderly multigravida, third trimester: Secondary | ICD-10-CM

## 2018-11-27 NOTE — Progress Notes (Signed)
   PRENATAL VISIT NOTE  Subjective:  Teresa Clark is a 37 y.o. [id number] at 252w4d being seen today for ongoing prenatal care.  She is currently monitored for the following issues for this high-risk pregnancy and has Language barrier, cultural differences; Supervision of other normal pregnancy, antepartum; Dichorionic diamniotic twin pregnancy, antepartum; Pregnancy with uncertain date of last menstrual period in first trimester, antepartum; and AMA (advanced maternal age) multigravida 35+ on their problem list.  Patient reports no complaints.  Contractions: Not present. Vag. Bleeding: None.  Movement: Present. Denies leaking of fluid.   The following portions of the patient's history were reviewed and updated as appropriate: allergies, current medications, past family history, past medical history, past social history, past surgical history and problem list. Problem list updated.  Objective:   Vitals:   11/27/18 0832  BP: 100/63  Pulse: 72  Weight: 133 lb 3.2 oz (60.4 kg)    Fetal Status: Fetal Heart Rate (bpm): 146/141   Movement: Present     General:  Alert, oriented and cooperative. Patient is in no acute distress.  Skin: Skin is warm and dry. No rash noted.   Cardiovascular: Normal heart rate noted  Respiratory: Normal respiratory effort, no problems with respiration noted  Abdomen: Soft, gravid, appropriate for gestational age.  Pain/Pressure: Absent     Pelvic: Cervical exam deferred        Extremities: Normal range of motion.  Edema: None  Mental Status: Normal mood and affect. Normal behavior. Normal judgment and thought content.   Assessment and Plan:  Pregnancy: [id number] at 6752w4d  1. Supervision of other normal pregnancy, antepartum Tdap today 2 hr GTT 28 week labs today  2. Language barrier, cultural differences Burmese interpretor used  3. Dichorionic diamniotic twin pregnancy, antepartum Normal growth, last discordance 19%   4. Multigravida of advanced maternal  age in third trimester   Preterm labor symptoms and general obstetric precautions including but not limited to vaginal bleeding, contractions, leaking of fluid and fetal movement were reviewed in detail with the patient. Please refer to After Visit Summary for other counseling recommendations.  Return in about 2 weeks (around 12/11/2018) for OB visit (MD).  Future Appointments  Date Time Provider Department Center  12/10/2018  9:15 AM [facility] US 4 [facility]    Conan BowensKelly M Glendell Fouse, MD

## 2018-11-28 ENCOUNTER — Encounter: Payer: Self-pay | Admitting: Obstetrics and Gynecology

## 2018-11-28 DIAGNOSIS — Z8632 Personal history of gestational diabetes: Secondary | ICD-10-CM | POA: Insufficient documentation

## 2018-11-28 LAB — CBC
HEMATOCRIT: 36.5 % (ref 34.0–46.6)
HEMOGLOBIN: 12.1 g/dL (ref 11.1–15.9)
MCH: 33.4 pg — ABNORMAL HIGH (ref 26.6–33.0)
MCHC: 33.2 g/dL (ref 31.5–35.7)
MCV: 101 fL — ABNORMAL HIGH (ref 79–97)
PLATELETS: 346 10*3/uL (ref 150–450)
RBC: 3.62 x10E6/uL — ABNORMAL LOW (ref 3.77–5.28)
RDW: 13 % (ref 12.3–15.4)
WBC: 11.9 10*3/uL — ABNORMAL HIGH (ref 3.4–10.8)

## 2018-11-28 LAB — GLUCOSE TOLERANCE, 2 HOURS W/ 1HR
GLUCOSE, 1 HOUR: 181 mg/dL — AB (ref 65–179)
Glucose, 2 hour: 131 mg/dL (ref 65–152)
Glucose, Fasting: 71 mg/dL (ref 65–91)

## 2018-11-28 LAB — HIV ANTIBODY (ROUTINE TESTING W REFLEX): HIV Screen 4th Generation wRfx: NONREACTIVE

## 2018-11-28 LAB — RPR: RPR Ser Ql: NONREACTIVE

## 2018-12-03 ENCOUNTER — Other Ambulatory Visit: Payer: BLUE CROSS/BLUE SHIELD

## 2018-12-05 ENCOUNTER — Encounter: Payer: BLUE CROSS/BLUE SHIELD | Attending: Obstetrics & Gynecology | Admitting: *Deleted

## 2018-12-05 ENCOUNTER — Ambulatory Visit: Payer: BLUE CROSS/BLUE SHIELD | Admitting: *Deleted

## 2018-12-05 ENCOUNTER — Other Ambulatory Visit: Payer: Self-pay

## 2018-12-05 DIAGNOSIS — O24410 Gestational diabetes mellitus in pregnancy, diet controlled: Secondary | ICD-10-CM

## 2018-12-05 DIAGNOSIS — R7302 Impaired glucose tolerance (oral): Secondary | ICD-10-CM

## 2018-12-05 MED ORDER — GLUCOSE BLOOD VI STRP: ORAL_STRIP | 12 refills | Status: DC

## 2018-12-05 MED ORDER — ACCU-CHEK GUIDE W/DEVICE KIT: 1.0000 | PACK | Freq: Once | 0 refills | Status: AC

## 2018-12-05 MED ORDER — ACCU-CHEK FASTCLIX LANCETS MISC: 1.0000 | Freq: Four times a day (QID) | 12 refills | Status: DC

## 2018-12-05 NOTE — Progress Notes (Signed)
  Patient was seen on 12/05/2018 for Gestational Diabetes self-management. EDD 02/15/2019. She speaks Burmese,we used the [employer] for this visit. She is pregnant with twins. Patient states no history of GDM. Diet history obtained. Patient eats good variety of all food groups. Beverages include water and milk.  She eats a small breakfast and one large meal around 3 PM. The following learning objectives were met by the patient :   States the definition of Gestational Diabetes  States why dietary management is important in controlling blood glucose  Describes the effects of carbohydrates on blood glucose levels  Demonstrates ability to create a balanced meal plan  Demonstrates carbohydrate counting   States when to check blood glucose levels  Demonstrates proper blood glucose monitoring techniques  States the effect of stress and exercise on blood glucose levels  States the importance of limiting caffeine and abstaining from alcohol and smoking  Plan:  Aim for small serving of rice at her main meal and a 2nd serviing in the evening time.  Include vegetables and meat or chicken or eggs with each meal as desired If OK with your MD, consider  increasing your activity level by walking, Arm Chair Exercises or other activity daily as tolerated Begin checking BG before breakfast and 2 hours after first bite of breakfast, lunch and dinner as directed by MD  Bring Log Book/Sheet to every medical appointment   Baby Scripts:  Patient not appropriate for Baby Scripts due to language barrier  Take medication if directed by MD  I called her Pharmacy to verify the out of pocket cost of Accu Chek Guide with strips and drums after her insurance pays and they state $40. Patient states she can pay that.   Blood glucose monitor Rx called into pharmacy: [place] with Fast Clix drums Patient instructed to test pre breakfast and 2 hours each meal as directed by MD  Patient  instructed to monitor glucose levels: FBS: 60 - 95 mg/dl 2 hour: <120 mg/dl  Patient received the following handouts:English with use of food models and drawings  Nutrition Diabetes and Pregnancy  Carbohydrate Counting List  BG Log Sheet  Patient will be seen for follow-as needed.

## 2018-12-10 ENCOUNTER — Other Ambulatory Visit (HOSPITAL_COMMUNITY): Payer: Self-pay | Admitting: Obstetrics and Gynecology

## 2018-12-10 ENCOUNTER — Ambulatory Visit (HOSPITAL_COMMUNITY)
Admission: RE | Admit: 2018-12-10 | Discharge: 2018-12-10 | Disposition: A | Discharge status: Home / Self Care | Payer: BLUE CROSS/BLUE SHIELD | Source: Ambulatory Visit | Attending: Obstetrics & Gynecology | Admitting: Obstetrics & Gynecology

## 2018-12-10 ENCOUNTER — Other Ambulatory Visit (HOSPITAL_COMMUNITY): Payer: Self-pay | Admitting: *Deleted

## 2018-12-10 ENCOUNTER — Encounter (HOSPITAL_COMMUNITY): Payer: Self-pay

## 2018-12-10 DIAGNOSIS — O30043 Twin pregnancy, dichorionic/diamniotic, third trimester: Secondary | ICD-10-CM | POA: Diagnosis not present

## 2018-12-10 DIAGNOSIS — Z3A30 30 weeks gestation of pregnancy: Secondary | ICD-10-CM | POA: Diagnosis not present

## 2018-12-10 DIAGNOSIS — O09523 Supervision of elderly multigravida, third trimester: Secondary | ICD-10-CM | POA: Diagnosis not present

## 2018-12-10 DIAGNOSIS — O24419 Gestational diabetes mellitus in pregnancy, unspecified control: Secondary | ICD-10-CM | POA: Diagnosis not present

## 2018-12-10 DIAGNOSIS — Z362 Encounter for other antenatal screening follow-up: Secondary | ICD-10-CM | POA: Diagnosis not present

## 2018-12-10 DIAGNOSIS — O09522 Supervision of elderly multigravida, second trimester: Secondary | ICD-10-CM | POA: Diagnosis not present

## 2018-12-10 DIAGNOSIS — O30049 Twin pregnancy, dichorionic/diamniotic, unspecified trimester: Secondary | ICD-10-CM

## 2018-12-16 ENCOUNTER — Ambulatory Visit (INDEPENDENT_AMBULATORY_CARE_PROVIDER_SITE_OTHER): Payer: BLUE CROSS/BLUE SHIELD | Admitting: Family Medicine

## 2018-12-16 VITALS — BP 103/71 | Wt 136.6 lb

## 2018-12-16 DIAGNOSIS — O30043 Twin pregnancy, dichorionic/diamniotic, third trimester: Secondary | ICD-10-CM

## 2018-12-16 DIAGNOSIS — O09523 Supervision of elderly multigravida, third trimester: Secondary | ICD-10-CM

## 2018-12-16 DIAGNOSIS — Z029 Encounter for administrative examinations, unspecified: Secondary | ICD-10-CM

## 2018-12-16 DIAGNOSIS — Z3483 Encounter for supervision of other normal pregnancy, third trimester: Secondary | ICD-10-CM

## 2018-12-16 DIAGNOSIS — Z603 Acculturation difficulty: Secondary | ICD-10-CM

## 2018-12-16 DIAGNOSIS — O30049 Twin pregnancy, dichorionic/diamniotic, unspecified trimester: Secondary | ICD-10-CM

## 2018-12-16 DIAGNOSIS — Z3480 Encounter for supervision of other normal pregnancy, unspecified trimester: Secondary | ICD-10-CM

## 2018-12-16 DIAGNOSIS — O24410 Gestational diabetes mellitus in pregnancy, diet controlled: Secondary | ICD-10-CM

## 2018-12-16 NOTE — Progress Notes (Signed)
Pt has manual glucose log

## 2018-12-16 NOTE — Progress Notes (Signed)
Subjective:  Teresa Clark is a 37 y.o. [id number] at 7161w2d being seen today for ongoing prenatal care.  She is currently monitored for the following issues for this high-risk pregnancy and has Language barrier, cultural differences; Supervision of other normal pregnancy, antepartum; Dichorionic diamniotic twin pregnancy, antepartum; Pregnancy with uncertain date of last menstrual period in first trimester, antepartum; AMA (advanced maternal age) multigravida 35+; and Gestational diabetes on their problem list.  GDM: Patient diet controlled Fasting: 70-92 2hr PP: 81-162 (6 elevated blood sugars, all at lunch)  Patient reports no complaints.  Contractions: Not present. Vag. Bleeding: None.  Movement: Present. Denies leaking of fluid.   The following portions of the patient's history were reviewed and updated as appropriate: allergies, current medications, past family history, past medical history, past social history, past surgical history and problem list. Problem list updated.  Objective:   Vitals:   12/16/18 1322  BP: 103/71  Weight: 136 lb 9.6 oz (62 kg)    Fetal Status: Fetal Heart Rate (bpm): 142/137   Movement: Present     General:  Alert, oriented and cooperative. Patient is in no acute distress.  Skin: Skin is warm and dry. No rash noted.   Cardiovascular: Normal heart rate noted  Respiratory: Normal respiratory effort, no problems with respiration noted  Abdomen: Soft, gravid, appropriate for gestational age. Pain/Pressure: Absent     Pelvic: Vag. Bleeding: None     Cervical exam deferred        Extremities: Normal range of motion.  Edema: None  Mental Status: Normal mood and affect. Normal behavior. Normal judgment and thought content.   Urinalysis:      Assessment and Plan:  Pregnancy: [id number] at 6661w2d  1. Supervision of other normal pregnancy, antepartum FHT normal. FH consistent with twins  2. Multigravida of advanced maternal age in third trimester Continue growth  US  3. Language barrier, cultural differences Interpreter used  4. Dichorionic diamniotic twin pregnancy, antepartum Appropriate growth - 11% discordance.  F/u growth in 3 weeks  5. Diet controlled gestational diabetes mellitus (GDM) in third trimester Discussed diet - eating rice with lunch. Discussed eating half the amount of rice. Continue diet control at this moment.  Preterm labor symptoms and general obstetric precautions including but not limited to vaginal bleeding, contractions, leaking of fluid and fetal movement were reviewed in detail with the patient. Please refer to After Visit Summary for other counseling recommendations.  No follow-ups on file.   Levie HeritageStinson, Jacob J, DO

## 2018-12-17 ENCOUNTER — Ambulatory Visit (HOSPITAL_COMMUNITY): Payer: Medicaid Other

## 2018-12-30 DIAGNOSIS — O30009 Twin pregnancy, unspecified number of placenta and unspecified number of amniotic sacs, unspecified trimester: Secondary | ICD-10-CM

## 2018-12-30 LAB — GLUCOSE, POCT (MANUAL RESULT ENTRY): POC GLUCOSE: 92 mg/dL (ref 70–99)

## 2018-12-30 NOTE — Congregational Nurse Program (Signed)
Teresa Clark came in for routine blood pressure and sugar check. She is pregnant with twin boys due in February.She is c/o increased fatigue and lack of sleep due to pregnancy. I have provided health education on diet, gentle exercise and staying rehydrated. Arman Bogus RN BSN PCCN [phone]

## 2019-01-01 ENCOUNTER — Ambulatory Visit (INDEPENDENT_AMBULATORY_CARE_PROVIDER_SITE_OTHER): Payer: Medicaid Other | Admitting: Family Medicine

## 2019-01-01 VITALS — BP 111/72 | HR 80 | Wt 141.0 lb

## 2019-01-01 DIAGNOSIS — O30043 Twin pregnancy, dichorionic/diamniotic, third trimester: Secondary | ICD-10-CM

## 2019-01-01 DIAGNOSIS — Z3480 Encounter for supervision of other normal pregnancy, unspecified trimester: Secondary | ICD-10-CM

## 2019-01-01 DIAGNOSIS — O09523 Supervision of elderly multigravida, third trimester: Secondary | ICD-10-CM

## 2019-01-01 DIAGNOSIS — Z3483 Encounter for supervision of other normal pregnancy, third trimester: Secondary | ICD-10-CM

## 2019-01-01 DIAGNOSIS — O30049 Twin pregnancy, dichorionic/diamniotic, unspecified trimester: Secondary | ICD-10-CM

## 2019-01-01 DIAGNOSIS — Z3A33 33 weeks gestation of pregnancy: Secondary | ICD-10-CM

## 2019-01-01 DIAGNOSIS — O24410 Gestational diabetes mellitus in pregnancy, diet controlled: Secondary | ICD-10-CM

## 2019-01-01 MED ORDER — GLUCOSE BLOOD VI STRP: ORAL_STRIP | 12 refills | Status: DC

## 2019-01-01 MED ORDER — ACCU-CHEK FASTCLIX LANCETS MISC: 1.0000 | Freq: Four times a day (QID) | 12 refills | Status: DC

## 2019-01-01 NOTE — Patient Instructions (Signed)

## 2019-01-01 NOTE — Progress Notes (Signed)
   PRENATAL VISIT NOTE  Subjective:  Teresa Clark is a 38 y.o. D3O6712 at [redacted]w[redacted]d being seen today for ongoing prenatal care.  She is currently monitored for the following issues for this high-risk pregnancy and has Language barrier, cultural differences; Supervision of other normal pregnancy, antepartum; Dichorionic diamniotic twin pregnancy, antepartum; Pregnancy with uncertain date of last menstrual period in first trimester, antepartum; AMA (advanced maternal age) multigravida 35+; and Gestational diabetes on their problem list.  Patient reports no complaints.  Contractions: Not present. Vag. Bleeding: None.  Movement: Present. Denies leaking of fluid.   The following portions of the patient's history were reviewed and updated as appropriate: allergies, current medications, past family history, past medical history, past social history, past surgical history and problem list. Problem list updated.  Objective:   Vitals:   01/01/19 1507  BP: 111/72  Pulse: 80  Weight: 141 lb (64 kg)    Fetal Status: Fetal Heart Rate (bpm): 140/135 Fundal Height: 39 cm Movement: Present     General:  Alert, oriented and cooperative. Patient is in no acute distress.  Skin: Skin is warm and dry. No rash noted.   Cardiovascular: Normal heart rate noted  Respiratory: Normal respiratory effort, no problems with respiration noted  Abdomen: Soft, gravid, appropriate for gestational age.  Pain/Pressure: Present     Pelvic: Cervical exam deferred        Extremities: Normal range of motion.  Edema: None  Mental Status: Normal mood and affect. Normal behavior. Normal judgment and thought content.   Assessment and Plan:  Pregnancy: [id number] at [redacted]w[redacted]d  1. Diet controlled gestational diabetes mellitus (GDM) in third trimester FBS 71-85 2 hour pp 67-124 1 out of range - glucose blood test strip; Use as instructed  Dispense: 100 each; Refill: 12 - ACCU-CHEK FASTCLIX LANCETS MISC; 1 Device by Percutaneous route 4  (four) times daily.  Dispense: 100 each; Refill: 12  2. Supervision of other normal pregnancy, antepartum Continue prenatal care.   3. Dichorionic diamniotic twin pregnancy, antepartum Concordant by last u/s A Breech, B Vertex-->may need C-section  4. Multigravida of advanced maternal age in third trimester Declines genetics  Preterm labor symptoms and general obstetric precautions including but not limited to vaginal bleeding, contractions, leaking of fluid and fetal movement were reviewed in detail with the patient. Please refer to After Visit Summary for other counseling recommendations.  Return in 2 weeks (on 01/15/2019).  Future Appointments  Date Time Provider Department Center  01/07/2019  9:00 AM [facility]-M[facility] [place] 3 [facility]-M[facility] MFC-US  01/17/2019 10:15 AM Teresa Bossier, MD [facility]    Teresa Bores, MD

## 2019-01-02 ENCOUNTER — Encounter: Payer: Self-pay | Admitting: *Deleted

## 2019-01-02 ENCOUNTER — Encounter (HOSPITAL_COMMUNITY): Payer: Self-pay

## 2019-01-07 ENCOUNTER — Other Ambulatory Visit (HOSPITAL_COMMUNITY): Payer: Self-pay | Admitting: Obstetrics and Gynecology

## 2019-01-07 ENCOUNTER — Other Ambulatory Visit (HOSPITAL_COMMUNITY): Payer: Self-pay | Admitting: *Deleted

## 2019-01-07 ENCOUNTER — Encounter (HOSPITAL_COMMUNITY): Payer: Self-pay

## 2019-01-07 ENCOUNTER — Ambulatory Visit (HOSPITAL_COMMUNITY)
Admission: RE | Admit: 2019-01-07 | Discharge: 2019-01-07 | Disposition: A | Discharge status: Home / Self Care | Payer: Medicaid Other | Source: Ambulatory Visit | Attending: Obstetrics & Gynecology | Admitting: Obstetrics & Gynecology

## 2019-01-07 DIAGNOSIS — O24410 Gestational diabetes mellitus in pregnancy, diet controlled: Secondary | ICD-10-CM

## 2019-01-07 DIAGNOSIS — O09523 Supervision of elderly multigravida, third trimester: Secondary | ICD-10-CM | POA: Diagnosis not present

## 2019-01-07 DIAGNOSIS — O30043 Twin pregnancy, dichorionic/diamniotic, third trimester: Secondary | ICD-10-CM | POA: Diagnosis not present

## 2019-01-07 DIAGNOSIS — O30049 Twin pregnancy, dichorionic/diamniotic, unspecified trimester: Secondary | ICD-10-CM | POA: Diagnosis not present

## 2019-01-07 DIAGNOSIS — Z3A34 34 weeks gestation of pregnancy: Secondary | ICD-10-CM | POA: Diagnosis not present

## 2019-01-07 DIAGNOSIS — O24419 Gestational diabetes mellitus in pregnancy, unspecified control: Secondary | ICD-10-CM | POA: Diagnosis not present

## 2019-01-07 NOTE — ED Notes (Signed)
Pt not taking PNV because medicaid does not cover the cost and she can't afford the medication.  Not checking her blood sugar, she has new lancets and she does not know how to use them.  Pt does not have them with her.  Instructed her to take them to her next OB appt.

## 2019-01-13 ENCOUNTER — Encounter (HOSPITAL_COMMUNITY): Payer: Self-pay

## 2019-01-14 ENCOUNTER — Other Ambulatory Visit: Payer: Self-pay | Admitting: *Deleted

## 2019-01-14 ENCOUNTER — Ambulatory Visit (HOSPITAL_COMMUNITY)
Admission: RE | Admit: 2019-01-14 | Discharge: 2019-01-14 | Disposition: A | Discharge status: Home / Self Care | Payer: Medicaid Other | Source: Ambulatory Visit | Attending: Obstetrics & Gynecology | Admitting: Obstetrics & Gynecology

## 2019-01-14 ENCOUNTER — Ambulatory Visit: Payer: Medicaid Other | Admitting: *Deleted

## 2019-01-14 ENCOUNTER — Encounter (HOSPITAL_COMMUNITY): Payer: Self-pay

## 2019-01-14 ENCOUNTER — Encounter: Payer: Medicaid Other | Attending: Obstetrics & Gynecology | Admitting: *Deleted

## 2019-01-14 VITALS — BP 108/65 | HR 88 | Wt 142.6 lb

## 2019-01-14 DIAGNOSIS — Z3A00 Weeks of gestation of pregnancy not specified: Secondary | ICD-10-CM | POA: Diagnosis not present

## 2019-01-14 DIAGNOSIS — O24410 Gestational diabetes mellitus in pregnancy, diet controlled: Secondary | ICD-10-CM

## 2019-01-14 DIAGNOSIS — Z3A35 35 weeks gestation of pregnancy: Secondary | ICD-10-CM

## 2019-01-14 DIAGNOSIS — O09523 Supervision of elderly multigravida, third trimester: Secondary | ICD-10-CM

## 2019-01-14 DIAGNOSIS — O30043 Twin pregnancy, dichorionic/diamniotic, third trimester: Secondary | ICD-10-CM | POA: Diagnosis not present

## 2019-01-14 HISTORY — DX: Gestational diabetes mellitus in pregnancy, unspecified control: O24.419

## 2019-01-14 MED ORDER — GLUCOSE BLOOD VI STRP: ORAL_STRIP | 12 refills | Status: DC

## 2019-01-14 MED ORDER — ONETOUCH ULTRASOFT LANCETS MISC: 12 refills | Status: DC

## 2019-01-14 NOTE — ED Notes (Signed)
Pt to go to the clinic after her BPP today.  Beverly the diabetic educator will instruct pt on how to use her glucometer.

## 2019-01-14 NOTE — Procedures (Signed)
Genita Pi Phaneuf [birth year] Aniela Pi Foisy 11-27-1981 [redacted]w[redacted]d   Fetus B Non-Stress Test Interpretation for 01/14/19  Indication: GDM poorly controlled  Fetal Heart Rate Fetus B Mode: External Baseline Rate (B): 125 BPM Variability: Moderate Accelerations: 15 x 15 Decelerations: None  Uterine Activity Mode: Toco Contraction Frequency (min): irreg UC noted Contraction Duration (sec): 60-120 Contraction Quality: Mild Resting Tone Palpated: Relaxed Resting Time: Adequate  Interpretation (Baby B - Fetal Testing) Nonstress Test Interpretation (Baby B): Reactive Comments (Baby B): FHR tracing rev'd by Dr. Judeth Cornfield   [redacted]w[redacted]d  Fetus A Non-Stress Test Interpretation for 01/14/19  Indication: GDM poorly controlled  Fetal Heart Rate A Mode: External Baseline Rate (A): 130 bpm Variability: Moderate Accelerations: 15 x 15 Decelerations: None Multiple birth?: Yes  Uterine Activity Mode: Toco Contraction Frequency (min): irreg UC noted Contraction Duration (sec): 60-120 Contraction Quality: Mild Resting Tone Palpated: Relaxed Resting Time: Adequate  Interpretation (Fetal Testing) Nonstress Test Interpretation: Reactive Comments: FHR tracing rev'd by Dr. Judeth Cornfield

## 2019-01-14 NOTE — ED Notes (Addendum)
Burmese Interpreter [phone] utilized during visit.  Pt not checking her blood sugars, she ran out of test strips and lancets.  Informed patient that refills for lancets and test strips are at [facility]. Pt then states that she does not know how to use her meter.  Pt continues to report having difficulty with transportation making future appointments and would like to go to a more convenient location to her home.  Pt informs the interpreter that she speaks Clydie Braun better than Burmese.  Clydie Braun interpreter # [phone] utilized for visit.

## 2019-01-15 NOTE — Progress Notes (Signed)
  Patient was seen on 01/14/2019 for Gestational Diabetes self-management follow up visit regarding blood glucose testing. She reports not knowing how to use the lancing device for the Accu Chek Guide and has run out of lancets for another meter she owns, the One Touch Ultra 2. EDD 02/15/2019. She speaks Burmese,we used the [employer] for this visit. She is pregnant with twins.   Patient would prefer to use lancing device that came with her One Touch Ultra meter which uses Delica lancets. I explained that Medicaid will only pay for Accu Chek products and I showed her how to use the Fast Clix Drum lancing device. She again prefers the One Touch lancing device even if she has to pay for those lancets. I reviewed with her how to use the Accu Chek Guide meter and she is fine with that product.  I asked that the Accu Chek lancet Drums be deleted from her Rx list and the One Touch lancets be added. The Accu Chek Guide strips were also added to her Rx list.   She had hand written list of BG both fasting and 2 hour post prandial for the previous week and all numbers were within target ranges. She was missing the most recent day due to running out of supplies. She is aware that Rx's have been sent to her preferred pharmacy and can be picked up later today.   Patient will be seen for follow-as needed.

## 2019-01-17 ENCOUNTER — Ambulatory Visit (INDEPENDENT_AMBULATORY_CARE_PROVIDER_SITE_OTHER): Payer: Medicaid Other | Admitting: Family Medicine

## 2019-01-17 VITALS — BP 111/70 | HR 95 | Wt 140.8 lb

## 2019-01-17 DIAGNOSIS — O30043 Twin pregnancy, dichorionic/diamniotic, third trimester: Secondary | ICD-10-CM

## 2019-01-17 DIAGNOSIS — Z3483 Encounter for supervision of other normal pregnancy, third trimester: Secondary | ICD-10-CM

## 2019-01-17 DIAGNOSIS — O30049 Twin pregnancy, dichorionic/diamniotic, unspecified trimester: Secondary | ICD-10-CM

## 2019-01-17 DIAGNOSIS — Z3480 Encounter for supervision of other normal pregnancy, unspecified trimester: Secondary | ICD-10-CM

## 2019-01-17 DIAGNOSIS — O24410 Gestational diabetes mellitus in pregnancy, diet controlled: Secondary | ICD-10-CM

## 2019-01-17 MED ORDER — ACCU-CHEK FASTCLIX LANCETS MISC: 0 refills | Status: DC

## 2019-01-17 MED ORDER — FOLIVANE-OB 130-92.4-1 MG PO CAPS: 1.0000 | ORAL_CAPSULE | Freq: Every day | ORAL | 6 refills | Status: DC

## 2019-01-17 NOTE — Progress Notes (Signed)
   PRENATAL VISIT NOTE *Video interpreter used for visit* Subjective:  Teresa Clark is a 38 y.o. [id number] at [redacted]w[redacted]d being seen today for ongoing prenatal care.  She is currently monitored for the following issues for this high-risk pregnancy and has Language barrier, cultural differences; Supervision of other normal pregnancy, antepartum; Dichorionic diamniotic twin pregnancy, antepartum; Pregnancy with uncertain date of last menstrual period in first trimester, antepartum; AMA (advanced maternal age) multigravida 35+; and Gestational diabetes on their problem list.  - only have test strips because everything else too expensive  - also has some lancets but needs more (was told $30) - confusion with brand and which to use - daughter has had a cold - she feels poorly - headache, congestion, cough, reports fever three days ago, able to eat/drink okay, no N/V   Contractions: Not present. Vag. Bleeding: None.  Movement: Present. Denies leaking of fluid.   The following portions of the patient's history were reviewed and updated as appropriate: allergies, current medications, past family history, past medical history, past social history, past surgical history and problem list. Problem list updated.  Objective:   Vitals:   01/17/19 1106  BP: 111/70  Pulse: 95  Weight: 140 lb 12.8 oz (63.9 kg)    Fetal Status: Fetal Heart Rate (bpm): 143/145   Movement: Present     General:  Alert, oriented and cooperative. Patient is in no acute distress.  Skin: Skin is warm and dry. No rash noted.   Cardiovascular: Normal heart rate noted  Respiratory: Normal respiratory effort, no problems with respiration noted  Abdomen: Soft, gravid, appropriate for gestational age.  Pain/Pressure: Present     Pelvic: Cervical exam deferred        Extremities: Normal range of motion.  Edema: None  Mental Status: Normal mood and affect. Normal behavior. Normal judgment and thought content.   Assessment and Plan:    Pregnancy: I7O6767 at [redacted]w[redacted]d  Dichorionic diamniotic twin pregnancy, antepartum -- has f/u growth U/S, BPP next week - vertex/vertex as of 01/07/19 -- prenatal care reviewed and otherwise UTD   Diet controlled gestational diabetes mellitus (GDM) in third trimester -- counseled multiple times with assistance of interpreter regarding testing BG and supplies, lots of confusion regarding cost and brand -- sent in Accu-check lancets so would be covered by Medicaid because will not pay out of pocket despite conversation with DM educator -- reinforced checking ACHS   Viral Syndrome: Afebrile today, cough/runny nose, slightly increased WOB. Discussed increased fluids, Tylenol. No recent travel, sick contact flu(-) at pediatrician. Reviewed return precautions.  Preterm labor symptoms and general obstetric precautions including but not limited to vaginal bleeding, contractions, leaking of fluid and fetal movement were reviewed in detail with the patient. Please refer to After Visit Summary for other counseling recommendations.  Return in about 1 week (around 01/24/2019) for HROB.  Future Appointments  Date Time Provider Department Center  01/21/2019  8:00 AM [facility] M[facility]  01/21/2019  9:15 AM WH-MFC [place] 4 WH-MFCUS MFC-US  01/28/2019  8:45 AM WH-MFC NST WH-MFC MFC-US  01/28/2019  9:15 AM WH-MFC [place] 4 WH-MFCUS MFC-US  01/28/2019 10:35 AM Hermina Staggers, MD WOC-[facility]A WOC   Tamera Stands, DO

## 2019-01-19 ENCOUNTER — Encounter (HOSPITAL_COMMUNITY): Payer: Self-pay | Admitting: Emergency Medicine

## 2019-01-19 ENCOUNTER — Other Ambulatory Visit: Payer: Self-pay

## 2019-01-19 ENCOUNTER — Emergency Department (HOSPITAL_COMMUNITY)
Admission: EM | Admit: 2019-01-19 | Discharge: 2019-01-19 | Disposition: A | Discharge status: Home / Self Care | Payer: Medicaid Other | Attending: Emergency Medicine | Admitting: Emergency Medicine

## 2019-01-19 DIAGNOSIS — O26893 Other specified pregnancy related conditions, third trimester: Secondary | ICD-10-CM | POA: Insufficient documentation

## 2019-01-19 DIAGNOSIS — J111 Influenza due to unidentified influenza virus with other respiratory manifestations: Secondary | ICD-10-CM | POA: Diagnosis not present

## 2019-01-19 DIAGNOSIS — J029 Acute pharyngitis, unspecified: Secondary | ICD-10-CM | POA: Insufficient documentation

## 2019-01-19 DIAGNOSIS — Z3A35 35 weeks gestation of pregnancy: Secondary | ICD-10-CM | POA: Insufficient documentation

## 2019-01-19 DIAGNOSIS — R0981 Nasal congestion: Secondary | ICD-10-CM | POA: Diagnosis not present

## 2019-01-19 DIAGNOSIS — J101 Influenza due to other identified influenza virus with other respiratory manifestations: Secondary | ICD-10-CM | POA: Diagnosis not present

## 2019-01-19 LAB — INFLUENZA PANEL BY PCR (TYPE A & B)
Influenza A By PCR: NEGATIVE
Influenza B By PCR: POSITIVE — AB

## 2019-01-19 LAB — GROUP A STREP BY PCR: GROUP A STREP BY PCR: NOT DETECTED

## 2019-01-19 MED ORDER — OSELTAMIVIR PHOSPHATE 75 MG PO CAPS
75.0000 mg | ORAL_CAPSULE | Freq: Once | ORAL | Status: AC
  Administered 2019-01-19: 75 mg via ORAL
  Filled 2019-01-19: qty 1

## 2019-01-19 MED ORDER — OSELTAMIVIR PHOSPHATE 75 MG PO CAPS: 75.0000 mg | ORAL_CAPSULE | Freq: Two times a day (BID) | ORAL | 0 refills | Status: DC

## 2019-01-19 MED ORDER — LIDOCAINE VISCOUS HCL 2 % MT SOLN
15.0000 mL | Freq: Once | OROMUCOSAL | Status: AC
  Administered 2019-01-19: 15 mL via OROMUCOSAL
  Filled 2019-01-19: qty 15

## 2019-01-19 MED ORDER — ACETAMINOPHEN 500 MG PO TABS
1000.0000 mg | ORAL_TABLET | Freq: Once | ORAL | Status: AC
  Administered 2019-01-19: 1000 mg via ORAL
  Filled 2019-01-19: qty 2

## 2019-01-19 NOTE — Discharge Instructions (Addendum)
Use Tylenol every 4 hours as needed for fever and pain.  Take that Tamiflu twice a day for 5 days.  Follow-up with your OB/GYN doctor.

## 2019-01-19 NOTE — ED Provider Notes (Signed)
[facility]   CSN: [id number] Arrival date & time: 01/19/19  0228     History   Chief Complaint Chief Complaint  Patient presents with  . Fever  . Sore Throat    HPI Teresa Clark is a 38 y.o. female.  Patient presents to the emergency department for evaluation of fever, nasal congestion, bloody nose, cough and sore throat.  Symptoms present for 1 day.  Patient is 35+ weeks pregnant.  She is not experiencing any abdominal pain, cramping, contractions.  She denies vaginal bleeding and fluid loss.  Evaluation slightly limited by language barrier.  Unfortunately our interpreter service does not have a Burmese interpreter available at this time.  Family at the bedside, however, speaks reasonably good [place] and was able to translate.     Past Medical History:  Diagnosis Date  . Gestational diabetes     Patient Active Problem List   Diagnosis Date Noted  . Gestational diabetes 11/28/2018  . Dichorionic diamniotic twin pregnancy, antepartum 08/01/2018  . Pregnancy with uncertain date of last menstrual period in first trimester, antepartum 08/01/2018  . AMA (advanced maternal age) multigravida 35+ 08/01/2018  . Supervision of other normal pregnancy, antepartum 07/31/2018  . Language barrier, cultural differences 02/01/2017    Past Surgical History:  Procedure Laterality Date  . NO PAST SURGERIES       OB History    Gravida  4   Para  3   Term  3   Preterm      AB      Living  2     SAB      TAB      Ectopic      Multiple      Live Births  3        Obstetric Comments  One child deceased around 521 1/2 years of age per patient.           Home Medications    Prior to Admission medications   Medication Sig Start Date End Date Taking? Authorizing Provider  ACCU-CHEK FASTCLIX LANCETS MISC Use as directed to check blood glucose four times daily. 01/17/19   Tamera StandsWallace, Laurel S, DO  glucose blood  (ACCU-CHEK GUIDE) test strip Use as instructed QID 01/14/19   Reva BoresPratt, Tanya S, MD  oseltamivir (TAMIFLU) 75 MG capsule Take 1 capsule (75 mg total) by mouth every 12 (twelve) hours. 01/19/19   Gilda CreasePollina, Christopher J, MD  Prenat w/o A Vit-FeFum-FePo-FA (FOLIVANE-OB) 130-92.4-1 MG CAPS Take 1 tablet by mouth daily. 01/17/19   Tamera StandsWallace, Laurel S, DO    Family History Family History  Family history unknown: Yes    Social History Social History   Tobacco Use  . Smoking status: Never Smoker  . Smokeless tobacco: Never Used  Substance Use Topics  . Alcohol use: No  . Drug use: No     Allergies   Patient has no known allergies.   Review of Systems Review of Systems  HENT: Positive for congestion, nosebleeds and sore throat.   Respiratory: Positive for cough.   All other systems reviewed and are negative.    Physical Exam Updated Vital Signs BP 118/79 (BP Location: Right Arm)   Pulse 80   Temp 99.5 F (37.5 C) (Oral)   Resp 20   Ht 5\' 4"  (1.626 m)   Wt 63.8 kg   LMP 05/04/2018 (Within Days)   SpO2 99%   BMI 24.14 kg/m   Physical Exam  Vitals signs and nursing note reviewed.  Constitutional:      General: She is not in acute distress.    Appearance: Normal appearance. She is well-developed.  HENT:     Head: Normocephalic and atraumatic.     Right Ear: Hearing normal.     Left Ear: Hearing normal.     Nose: Congestion present.     Mouth/Throat:     Pharynx: Posterior oropharyngeal erythema present.  Eyes:     Conjunctiva/sclera: Conjunctivae normal.     Pupils: Pupils are equal, round, and reactive to light.  Neck:     Musculoskeletal: Normal range of motion and neck supple.  Cardiovascular:     Rate and Rhythm: Regular rhythm.     Heart sounds: S1 normal and S2 normal. No murmur. No friction rub. No gallop.   Pulmonary:     Effort: Pulmonary effort is normal. No respiratory distress.     Breath sounds: Normal breath sounds.  Chest:     Chest wall: No tenderness.   Abdominal:     General: Bowel sounds are normal.     Palpations: Abdomen is soft.     Tenderness: There is no abdominal tenderness. There is no guarding or rebound. Negative signs include Murphy's sign and McBurney's sign.     Hernia: No hernia is present.  Musculoskeletal: Normal range of motion.  Skin:    General: Skin is warm and dry.     Findings: No rash.  Neurological:     Mental Status: She is alert and oriented to person, place, and time.     GCS: GCS eye subscore is 4. GCS verbal subscore is 5. GCS motor subscore is 6.     Cranial Nerves: No cranial nerve deficit.     Sensory: No sensory deficit.     Coordination: Coordination normal.  Psychiatric:        Speech: Speech normal.        Behavior: Behavior normal.        Thought Content: Thought content normal.      ED Treatments / Results  Labs (all labs ordered are listed, but only abnormal results are displayed) Labs Reviewed  INFLUENZA PANEL BY PCR (TYPE A & B) - Abnormal; Notable for the following components:      Result Value   Influenza B By PCR POSITIVE (*)    All other components within normal limits  GROUP A STREP BY PCR    EKG None  Radiology No results found.  Procedures Procedures (including critical care time)  Medications Ordered in ED Medications  oseltamivir (TAMIFLU) capsule 75 mg (75 mg Oral Given 01/19/19 0403)     Initial Impression / Assessment and Plan / ED Course  I have reviewed the triage vital signs and the nursing notes.  Pertinent labs & imaging results that were available during my care of the patient were reviewed by me and considered in my medical decision making (see chart for details).     35+ weeks pregnant patient presents to the ER with complaints of flulike illness.  Her main complaint is sore throat when she coughs or tries to swallow.  She also has nasal congestion and cough.  She did have some nosebleed earlier but the bleeding is currently stopped.  She is  breathing comfortably, no shortness of breath, no chest pain.  Lungs are clear to auscultation and her oxygen saturation is 99% on room air.  No concern for pneumonia or other pulmonary pathology.  Influenza  B is positive.  Initiate Tamiflu, symptomatic care.  Follow-up with OB/GYN.  Final Clinical Impressions(s) / ED Diagnoses   Final diagnoses:  Influenza B    ED Discharge Orders         Ordered    oseltamivir (TAMIFLU) 75 MG capsule  Every 12 hours     01/19/19 0415           Gilda CreasePollina, Christopher J, MD 01/19/19 [phone]

## 2019-01-19 NOTE — ED Notes (Signed)
OB Rapid Response on the way

## 2019-01-19 NOTE — ED Notes (Signed)
OB cancelled

## 2019-01-19 NOTE — ED Triage Notes (Signed)
  Patient comes in with sore throat and fever that has been going on for 4 days.  Patient has also started having nosebleeds the last few days.  Patient is [redacted]w[redacted]d pregnant and was seen at [facility] on 1/31.  Patient has been taking tylenol for fever but does not know how high it has been.  Patient has no abdominal pain. Patient states pain is in her throat and is 6/10.

## 2019-01-21 ENCOUNTER — Encounter (HOSPITAL_COMMUNITY): Payer: Self-pay

## 2019-01-21 ENCOUNTER — Other Ambulatory Visit: Payer: Self-pay | Admitting: General Practice

## 2019-01-21 ENCOUNTER — Encounter (HOSPITAL_COMMUNITY): Payer: Self-pay | Admitting: *Deleted

## 2019-01-21 ENCOUNTER — Ambulatory Visit (HOSPITAL_BASED_OUTPATIENT_CLINIC_OR_DEPARTMENT_OTHER)
Admission: RE | Admit: 2019-01-21 | Discharge: 2019-01-21 | Disposition: A | Discharge status: Home / Self Care | Payer: Medicaid Other | Source: Ambulatory Visit | Attending: Obstetrics & Gynecology | Admitting: Obstetrics & Gynecology

## 2019-01-21 ENCOUNTER — Inpatient Hospital Stay (HOSPITAL_COMMUNITY): Payer: Medicaid Other

## 2019-01-21 ENCOUNTER — Inpatient Hospital Stay (HOSPITAL_COMMUNITY): Payer: Medicaid Other | Admitting: Certified Registered Nurse Anesthetist

## 2019-01-21 ENCOUNTER — Inpatient Hospital Stay (HOSPITAL_COMMUNITY)
Admission: AD | Admit: 2019-01-21 | Discharge: 2019-01-24 | DRG: 787 | Disposition: A | Discharge status: Home / Self Care | Payer: Medicaid Other | Attending: Internal Medicine | Admitting: Internal Medicine

## 2019-01-21 ENCOUNTER — Other Ambulatory Visit: Payer: Self-pay

## 2019-01-21 ENCOUNTER — Ambulatory Visit (HOSPITAL_COMMUNITY)
Admission: RE | Admit: 2019-01-21 | Discharge: 2019-01-21 | Disposition: A | Discharge status: Home / Self Care | Payer: Medicaid Other | Source: Ambulatory Visit | Attending: Obstetrics & Gynecology | Admitting: Obstetrics & Gynecology

## 2019-01-21 ENCOUNTER — Encounter (HOSPITAL_COMMUNITY)
Admission: AD | Disposition: A | Discharge status: Home / Self Care | Payer: Self-pay | Source: Home / Self Care | Attending: Internal Medicine

## 2019-01-21 VITALS — BP 116/72 | HR 77 | Wt 137.8 lb

## 2019-01-21 DIAGNOSIS — O30043 Twin pregnancy, dichorionic/diamniotic, third trimester: Secondary | ICD-10-CM | POA: Diagnosis present

## 2019-01-21 DIAGNOSIS — O24429 Gestational diabetes mellitus in childbirth, unspecified control: Secondary | ICD-10-CM | POA: Diagnosis not present

## 2019-01-21 DIAGNOSIS — K7200 Acute and subacute hepatic failure without coma: Secondary | ICD-10-CM

## 2019-01-21 DIAGNOSIS — D649 Anemia, unspecified: Secondary | ICD-10-CM | POA: Diagnosis present

## 2019-01-21 DIAGNOSIS — R945 Abnormal results of liver function studies: Secondary | ICD-10-CM

## 2019-01-21 DIAGNOSIS — R7989 Other specified abnormal findings of blood chemistry: Secondary | ICD-10-CM | POA: Diagnosis present

## 2019-01-21 DIAGNOSIS — O2293 Venous complication in pregnancy, unspecified, third trimester: Secondary | ICD-10-CM | POA: Diagnosis not present

## 2019-01-21 DIAGNOSIS — K7290 Hepatic failure, unspecified without coma: Secondary | ICD-10-CM | POA: Diagnosis present

## 2019-01-21 DIAGNOSIS — Z3A36 36 weeks gestation of pregnancy: Secondary | ICD-10-CM

## 2019-01-21 DIAGNOSIS — R05 Cough: Secondary | ICD-10-CM | POA: Diagnosis not present

## 2019-01-21 DIAGNOSIS — O411232 Chorioamnionitis, third trimester, fetus 2: Secondary | ICD-10-CM | POA: Diagnosis not present

## 2019-01-21 DIAGNOSIS — K7589 Other specified inflammatory liver diseases: Secondary | ICD-10-CM | POA: Diagnosis not present

## 2019-01-21 DIAGNOSIS — O24419 Gestational diabetes mellitus in pregnancy, unspecified control: Secondary | ICD-10-CM

## 2019-01-21 DIAGNOSIS — O09523 Supervision of elderly multigravida, third trimester: Secondary | ICD-10-CM

## 2019-01-21 DIAGNOSIS — B199 Unspecified viral hepatitis without hepatic coma: Secondary | ICD-10-CM | POA: Diagnosis present

## 2019-01-21 DIAGNOSIS — J101 Influenza due to other identified influenza virus with other respiratory manifestations: Secondary | ICD-10-CM

## 2019-01-21 DIAGNOSIS — O24410 Gestational diabetes mellitus in pregnancy, diet controlled: Secondary | ICD-10-CM

## 2019-01-21 DIAGNOSIS — O321XX0 Maternal care for breech presentation, not applicable or unspecified: Secondary | ICD-10-CM | POA: Diagnosis not present

## 2019-01-21 DIAGNOSIS — D72829 Elevated white blood cell count, unspecified: Secondary | ICD-10-CM

## 2019-01-21 DIAGNOSIS — N179 Acute kidney failure, unspecified: Secondary | ICD-10-CM | POA: Diagnosis not present

## 2019-01-21 DIAGNOSIS — N132 Hydronephrosis with renal and ureteral calculous obstruction: Secondary | ICD-10-CM | POA: Diagnosis not present

## 2019-01-21 DIAGNOSIS — O9842 Viral hepatitis complicating childbirth: Secondary | ICD-10-CM | POA: Diagnosis present

## 2019-01-21 DIAGNOSIS — O9902 Anemia complicating childbirth: Secondary | ICD-10-CM | POA: Diagnosis present

## 2019-01-21 DIAGNOSIS — O9989 Other specified diseases and conditions complicating pregnancy, childbirth and the puerperium: Secondary | ICD-10-CM | POA: Diagnosis present

## 2019-01-21 DIAGNOSIS — O322XX2 Maternal care for transverse and oblique lie, fetus 2: Secondary | ICD-10-CM | POA: Diagnosis not present

## 2019-01-21 DIAGNOSIS — J988 Other specified respiratory disorders: Secondary | ICD-10-CM

## 2019-01-21 DIAGNOSIS — K828 Other specified diseases of gallbladder: Secondary | ICD-10-CM | POA: Diagnosis not present

## 2019-01-21 DIAGNOSIS — N1330 Unspecified hydronephrosis: Secondary | ICD-10-CM | POA: Diagnosis present

## 2019-01-21 DIAGNOSIS — O9952 Diseases of the respiratory system complicating childbirth: Secondary | ICD-10-CM | POA: Diagnosis not present

## 2019-01-21 DIAGNOSIS — O321XX1 Maternal care for breech presentation, fetus 1: Secondary | ICD-10-CM | POA: Diagnosis present

## 2019-01-21 DIAGNOSIS — O30049 Twin pregnancy, dichorionic/diamniotic, unspecified trimester: Secondary | ICD-10-CM

## 2019-01-21 DIAGNOSIS — D539 Nutritional anemia, unspecified: Secondary | ICD-10-CM | POA: Diagnosis not present

## 2019-01-21 HISTORY — DX: Influenza due to other identified influenza virus with other respiratory manifestations: J10.1

## 2019-01-21 LAB — COMPREHENSIVE METABOLIC PANEL
ALT: 449 U/L — ABNORMAL HIGH (ref 0–44)
ALT: 269 U/L — ABNORMAL HIGH (ref 0–44)
ANION GAP: 8 (ref 5–15)
AST: 321 U/L — ABNORMAL HIGH (ref 15–41)
AST: 197 U/L — ABNORMAL HIGH (ref 15–41)
Albumin: 2.7 g/dL — ABNORMAL LOW (ref 3.5–5.0)
Albumin: 2.5 g/dL — ABNORMAL LOW (ref 3.5–5.0)
Alkaline Phosphatase: 267 U/L — ABNORMAL HIGH (ref 38–126)
Alkaline Phosphatase: 201 U/L — ABNORMAL HIGH (ref 38–126)
Anion gap: 8 (ref 5–15)
BILIRUBIN TOTAL: 8.9 mg/dL — AB (ref 0.3–1.2)
BILIRUBIN TOTAL: 6.6 mg/dL — AB (ref 0.3–1.2)
BUN: 12 mg/dL (ref 6–20)
BUN: 11 mg/dL (ref 6–20)
CO2: 17 mmol/L — ABNORMAL LOW (ref 22–32)
CO2: 17 mmol/L — AB (ref 22–32)
Calcium: 8.7 mg/dL — ABNORMAL LOW (ref 8.9–10.3)
Calcium: 8 mg/dL — ABNORMAL LOW (ref 8.9–10.3)
Chloride: 111 mmol/L (ref 98–111)
Chloride: 113 mmol/L — ABNORMAL HIGH (ref 98–111)
Creatinine, Ser: 1.21 mg/dL — ABNORMAL HIGH (ref 0.44–1.00)
Creatinine, Ser: 1.14 mg/dL — ABNORMAL HIGH (ref 0.44–1.00)
GFR calc Af Amer: >60 mL/min (ref >60)
GFR calc Af Amer: >60 mL/min (ref >60)
GFR calc non Af Amer: >60 mL/min (ref >60)
GFR, EST NON AFRICAN AMERICAN: 58 mL/min — AB (ref >60)
Glucose, Bld: 83 mg/dL (ref 70–99)
Glucose, Bld: 96 mg/dL (ref 70–99)
Potassium: 4.2 mmol/L (ref 3.5–5.1)
Potassium: 4.7 mmol/L (ref 3.5–5.1)
Sodium: 136 mmol/L (ref 135–145)
Sodium: 138 mmol/L (ref 135–145)
Total Protein: 6.8 g/dL (ref 6.5–8.1)
Total Protein: 5.5 g/dL — ABNORMAL LOW (ref 6.5–8.1)

## 2019-01-21 LAB — MRSA PCR SCREENING: MRSA by PCR: NEGATIVE

## 2019-01-21 LAB — CBC
HCT: 40.9 % (ref 36.0–46.0)
HCT: 33.1 % — ABNORMAL LOW (ref 36.0–46.0)
Hemoglobin: 13.6 g/dL (ref 12.0–15.0)
Hemoglobin: 10.6 g/dL — ABNORMAL LOW (ref 12.0–15.0)
MCH: 35.1 pg — ABNORMAL HIGH (ref 26.0–34.0)
MCH: 34.6 pg — ABNORMAL HIGH (ref 26.0–34.0)
MCHC: 33.3 g/dL (ref 30.0–36.0)
MCHC: 32 g/dL (ref 30.0–36.0)
MCV: 105.4 fL — ABNORMAL HIGH (ref 80.0–100.0)
MCV: 108.2 fL — ABNORMAL HIGH (ref 80.0–100.0)
NRBC: 6 % — AB (ref 0.0–0.2)
Platelets: 224 10*3/uL (ref 150–400)
Platelets: 199 10*3/uL (ref 150–400)
RBC: 3.88 MIL/uL (ref 3.87–5.11)
RBC: 3.06 MIL/uL — ABNORMAL LOW (ref 3.87–5.11)
RDW: 13.8 % (ref 11.5–15.5)
RDW: 13.9 % (ref 11.5–15.5)
WBC: 14.5 10*3/uL — ABNORMAL HIGH (ref 4.0–10.5)
WBC: 13.6 10*3/uL — AB (ref 4.0–10.5)
nRBC: 4.1 % — ABNORMAL HIGH (ref 0.0–0.2)

## 2019-01-21 LAB — URINALYSIS, ROUTINE W REFLEX MICROSCOPIC
Glucose, UA: NEGATIVE mg/dL
Hgb urine dipstick: NEGATIVE
KETONES UR: NEGATIVE mg/dL
Nitrite: NEGATIVE
Protein, ur: NEGATIVE mg/dL
Specific Gravity, Urine: <1.005 — ABNORMAL LOW (ref 1.005–1.030)
pH: 6.5 (ref 5.0–8.0)

## 2019-01-21 LAB — DIFFERENTIAL
Basophils Absolute: 0.1 10*3/uL (ref 0.0–0.1)
Basophils Relative: 1 %
EOS PCT: 0 %
Eosinophils Absolute: 0 10*3/uL (ref 0.0–0.5)
Lymphocytes Relative: 20 %
Lymphs Abs: 2.9 10*3/uL (ref 0.7–4.0)
Monocytes Absolute: 1.5 10*3/uL — ABNORMAL HIGH (ref 0.1–1.0)
Monocytes Relative: 10 %
Neutro Abs: 10 10*3/uL — ABNORMAL HIGH (ref 1.7–7.7)
Neutrophils Relative %: 69 %

## 2019-01-21 LAB — BLOOD GAS, ARTERIAL
Acid-base deficit: 8.5 mmol/L — ABNORMAL HIGH (ref 0.0–2.0)
Bicarbonate: 15.3 mmol/L — ABNORMAL LOW (ref 20.0–28.0)
Drawn by: 12507
FIO2: 0.21
O2 Saturation: 100 %
PCO2 ART: 27.9 mmHg — AB (ref 32.0–48.0)
PO2 ART: 103 mmHg (ref 83.0–108.0)
pH, Arterial: 7.358 (ref 7.350–7.450)

## 2019-01-21 LAB — PROTIME-INR
INR: 0.99
Prothrombin Time: 13 seconds (ref 11.4–15.2)

## 2019-01-21 LAB — URINALYSIS, MICROSCOPIC (REFLEX)

## 2019-01-21 LAB — BILIRUBIN, DIRECT: Bilirubin, Direct: 4.6 mg/dL — ABNORMAL HIGH (ref 0.0–0.2)

## 2019-01-21 LAB — GLUCOSE, CAPILLARY
GLUCOSE-CAPILLARY: 83 mg/dL (ref 70–99)
Glucose-Capillary: 72 mg/dL (ref 70–99)
Glucose-Capillary: 176 mg/dL — ABNORMAL HIGH (ref 70–99)
Glucose-Capillary: 60 mg/dL — ABNORMAL LOW (ref 70–99)
Glucose-Capillary: 105 mg/dL — ABNORMAL HIGH (ref 70–99)
Glucose-Capillary: 78 mg/dL (ref 70–99)
Glucose-Capillary: 77 mg/dL (ref 70–99)
Glucose-Capillary: 83 mg/dL (ref 70–99)

## 2019-01-21 LAB — TYPE AND SCREEN
ABO/RH(D): O POS
Antibody Screen: NEGATIVE

## 2019-01-21 LAB — LACTIC ACID, PLASMA: Lactic Acid, Venous: 1.5 mmol/L (ref 0.5–1.9)

## 2019-01-21 LAB — APTT: aPTT: 41 seconds — ABNORMAL HIGH (ref 24–36)

## 2019-01-21 LAB — FIBRINOGEN: Fibrinogen: 319 mg/dL (ref 210–475)

## 2019-01-21 SURGERY — Surgical Case
Anesthesia: Spinal

## 2019-01-21 MED ORDER — MEPERIDINE HCL 25 MG/ML IJ SOLN: 6.2500 mg | INTRAMUSCULAR | Status: DC | PRN

## 2019-01-21 MED ORDER — SOD CITRATE-CITRIC ACID 500-334 MG/5ML PO SOLN
30.0000 mL | Freq: Once | ORAL | Status: AC
  Administered 2019-01-21: 30 mL via ORAL
  Filled 2019-01-21: qty 15

## 2019-01-21 MED ORDER — MEASLES, MUMPS & RUBELLA VAC IJ SOLR
0.5000 mL | Freq: Once | INTRAMUSCULAR | Status: AC
  Administered 2019-01-22: 0.5 mL via SUBCUTANEOUS
  Filled 2019-01-21: qty 0.5

## 2019-01-21 MED ORDER — TETANUS-DIPHTH-ACELL PERTUSSIS 5-2.5-18.5 LF-MCG/0.5 IM SUSP
0.5000 mL | Freq: Once | INTRAMUSCULAR | Status: AC
  Administered 2019-01-22: 0.5 mL via INTRAMUSCULAR
  Filled 2019-01-21: qty 0.5

## 2019-01-21 MED ORDER — DIPHENHYDRAMINE HCL 25 MG PO CAPS: 25.0000 mg | ORAL_CAPSULE | ORAL | Status: DC | PRN

## 2019-01-21 MED ORDER — DEXTROSE 50 % IV SOLN
1.0000 | Freq: Once | INTRAVENOUS | Status: AC
  Administered 2019-01-21: 50 mL via INTRAVENOUS
  Filled 2019-01-21: qty 50

## 2019-01-21 MED ORDER — NALBUPHINE HCL 10 MG/ML IJ SOLN
5.0000 mg | INTRAMUSCULAR | Status: DC | PRN
  Filled 2019-01-21: qty 0.5

## 2019-01-21 MED ORDER — ENOXAPARIN SODIUM 40 MG/0.4ML ~~LOC~~ SOLN
40.0000 mg | SUBCUTANEOUS | Status: DC
  Administered 2019-01-22 – 2019-01-23 (×2): 40 mg via SUBCUTANEOUS
  Filled 2019-01-21 (×2): qty 0.4

## 2019-01-21 MED ORDER — SIMETHICONE 80 MG PO CHEW
80.0000 mg | CHEWABLE_TABLET | Freq: Three times a day (TID) | ORAL | Status: DC
  Administered 2019-01-21 – 2019-01-24 (×9): 80 mg via ORAL
  Filled 2019-01-21 (×10): qty 1

## 2019-01-21 MED ORDER — KETOROLAC TROMETHAMINE 30 MG/ML IJ SOLN: 30.0000 mg | Freq: Four times a day (QID) | INTRAMUSCULAR | Status: AC | PRN

## 2019-01-21 MED ORDER — MORPHINE SULFATE (PF) 0.5 MG/ML IJ SOLN
INTRAMUSCULAR | Status: DC | PRN
  Administered 2019-01-21: .15 mg via INTRATHECAL

## 2019-01-21 MED ORDER — PHENYLEPHRINE 40 MCG/ML (10ML) SYRINGE FOR IV PUSH (FOR BLOOD PRESSURE SUPPORT)
PREFILLED_SYRINGE | INTRAVENOUS | Status: DC | PRN
  Administered 2019-01-21 (×3): 80 ug via INTRAVENOUS

## 2019-01-21 MED ORDER — ONDANSETRON HCL 4 MG/2ML IJ SOLN
INTRAMUSCULAR | Status: DC | PRN
  Administered 2019-01-21: 4 mg via INTRAVENOUS

## 2019-01-21 MED ORDER — SCOPOLAMINE 1 MG/3DAYS TD PT72: 1.0000 | MEDICATED_PATCH | Freq: Once | TRANSDERMAL | Status: DC

## 2019-01-21 MED ORDER — PHENYLEPHRINE 40 MCG/ML (10ML) SYRINGE FOR IV PUSH (FOR BLOOD PRESSURE SUPPORT)
PREFILLED_SYRINGE | INTRAVENOUS | Status: AC
  Filled 2019-01-21: qty 10

## 2019-01-21 MED ORDER — DIBUCAINE 1 % RE OINT
1.0000 "application " | TOPICAL_OINTMENT | RECTAL | Status: DC | PRN
  Filled 2019-01-21: qty 28

## 2019-01-21 MED ORDER — OXYCODONE HCL 5 MG PO TABS
5.0000 mg | ORAL_TABLET | ORAL | Status: DC | PRN
  Administered 2019-01-22 – 2019-01-23 (×3): 5 mg via ORAL
  Filled 2019-01-21 (×3): qty 1

## 2019-01-21 MED ORDER — NALOXONE HCL 0.4 MG/ML IJ SOLN: 0.4000 mg | INTRAMUSCULAR | Status: DC | PRN

## 2019-01-21 MED ORDER — KETOROLAC TROMETHAMINE 30 MG/ML IJ SOLN: 30.0000 mg | Freq: Once | INTRAMUSCULAR | Status: DC | PRN

## 2019-01-21 MED ORDER — SENNOSIDES-DOCUSATE SODIUM 8.6-50 MG PO TABS
2.0000 | ORAL_TABLET | ORAL | Status: DC
  Administered 2019-01-22 – 2019-01-24 (×3): 2 via ORAL
  Filled 2019-01-21 (×3): qty 2

## 2019-01-21 MED ORDER — PROMETHAZINE HCL 25 MG/ML IJ SOLN: 6.2500 mg | INTRAMUSCULAR | Status: DC | PRN

## 2019-01-21 MED ORDER — ZOLPIDEM TARTRATE 5 MG PO TABS: 5.0000 mg | ORAL_TABLET | Freq: Every evening | ORAL | Status: DC | PRN

## 2019-01-21 MED ORDER — OXYTOCIN 10 UNIT/ML IJ SOLN
INTRAMUSCULAR | Status: AC
  Filled 2019-01-21: qty 4

## 2019-01-21 MED ORDER — OXYTOCIN 40 UNITS IN NORMAL SALINE INFUSION - SIMPLE MED
2.5000 [IU]/h | INTRAVENOUS | Status: AC
  Filled 2019-01-21: qty 1000

## 2019-01-21 MED ORDER — NALBUPHINE HCL 10 MG/ML IJ SOLN
5.0000 mg | Freq: Once | INTRAMUSCULAR | Status: DC | PRN
  Filled 2019-01-21: qty 0.5

## 2019-01-21 MED ORDER — STERILE WATER FOR IRRIGATION IR SOLN
Status: DC | PRN
  Administered 2019-01-21: 1000 mL

## 2019-01-21 MED ORDER — SODIUM CHLORIDE 0.9 % IV SOLN
INTRAVENOUS | Status: DC
  Administered 2019-01-21: 11:00:00 via INTRAVENOUS

## 2019-01-21 MED ORDER — SODIUM CHLORIDE 0.9 % IR SOLN
Status: DC | PRN
  Administered 2019-01-21: 1000 mL

## 2019-01-21 MED ORDER — ACETAMINOPHEN 500 MG PO TABS
1000.0000 mg | ORAL_TABLET | Freq: Four times a day (QID) | ORAL | Status: DC
  Filled 2019-01-21: qty 2

## 2019-01-21 MED ORDER — FAMOTIDINE IN NACL 20-0.9 MG/50ML-% IV SOLN
20.0000 mg | Freq: Once | INTRAVENOUS | Status: DC
  Filled 2019-01-21: qty 50

## 2019-01-21 MED ORDER — ALBUMIN HUMAN 5 % IV SOLN
INTRAVENOUS | Status: AC
  Filled 2019-01-21: qty 250

## 2019-01-21 MED ORDER — LACTATED RINGERS IV SOLN
INTRAVENOUS | Status: DC
  Administered 2019-01-21 (×2): via INTRAVENOUS

## 2019-01-21 MED ORDER — SODIUM CHLORIDE 0.9% FLUSH: 3.0000 mL | INTRAVENOUS | Status: DC | PRN

## 2019-01-21 MED ORDER — FENTANYL CITRATE (PF) 100 MCG/2ML IJ SOLN
INTRAMUSCULAR | Status: DC | PRN
  Administered 2019-01-21: 15 ug via INTRATHECAL

## 2019-01-21 MED ORDER — BETAMETHASONE SOD PHOS & ACET 6 (3-3) MG/ML IJ SUSP
12.0000 mg | INTRAMUSCULAR | Status: DC
  Administered 2019-01-21: 12 mg via INTRAMUSCULAR
  Filled 2019-01-21: qty 2

## 2019-01-21 MED ORDER — BUPIVACAINE IN DEXTROSE 0.75-8.25 % IT SOLN
INTRATHECAL | Status: DC | PRN
  Administered 2019-01-21: 10.5 mg via INTRATHECAL

## 2019-01-21 MED ORDER — LACTATED RINGERS IV SOLN
INTRAVENOUS | Status: DC
  Administered 2019-01-21 – 2019-01-23 (×5): via INTRAVENOUS

## 2019-01-21 MED ORDER — PHENYLEPHRINE 8 MG IN D5W 100 ML (0.08MG/ML) PREMIX OPTIME
INJECTION | INTRAVENOUS | Status: AC
  Filled 2019-01-21: qty 100

## 2019-01-21 MED ORDER — PRENATAL MULTIVITAMIN CH
1.0000 | ORAL_TABLET | Freq: Every day | ORAL | Status: DC
  Administered 2019-01-22 – 2019-01-24 (×3): 1 via ORAL
  Filled 2019-01-21 (×3): qty 1

## 2019-01-21 MED ORDER — SIMETHICONE 80 MG PO CHEW
80.0000 mg | CHEWABLE_TABLET | ORAL | Status: DC | PRN
  Filled 2019-01-21 (×2): qty 1

## 2019-01-21 MED ORDER — DIPHENHYDRAMINE HCL 50 MG/ML IJ SOLN: 12.5000 mg | INTRAMUSCULAR | Status: DC | PRN

## 2019-01-21 MED ORDER — DIPHENHYDRAMINE HCL 25 MG PO CAPS: 25.0000 mg | ORAL_CAPSULE | Freq: Four times a day (QID) | ORAL | Status: DC | PRN

## 2019-01-21 MED ORDER — SODIUM CHLORIDE 0.9 % IV SOLN
INTRAVENOUS | Status: DC
  Administered 2019-01-21: 12:00:00 via INTRAVENOUS

## 2019-01-21 MED ORDER — ALBUMIN HUMAN 5 % IV SOLN
INTRAVENOUS | Status: DC | PRN
  Administered 2019-01-21: 17:00:00 via INTRAVENOUS

## 2019-01-21 MED ORDER — WITCH HAZEL-GLYCERIN EX PADS
1.0000 "application " | MEDICATED_PAD | CUTANEOUS | Status: DC | PRN
  Filled 2019-01-21: qty 100

## 2019-01-21 MED ORDER — PHENYLEPHRINE 8 MG IN D5W 100 ML (0.08MG/ML) PREMIX OPTIME
INJECTION | INTRAVENOUS | Status: DC | PRN
  Administered 2019-01-21: 60 ug/min via INTRAVENOUS

## 2019-01-21 MED ORDER — MORPHINE SULFATE (PF) 0.5 MG/ML IJ SOLN
INTRAMUSCULAR | Status: AC
  Filled 2019-01-21: qty 10

## 2019-01-21 MED ORDER — HYDROMORPHONE HCL 1 MG/ML IJ SOLN: 0.2500 mg | INTRAMUSCULAR | Status: DC | PRN

## 2019-01-21 MED ORDER — OXYTOCIN 10 UNIT/ML IJ SOLN
INTRAVENOUS | Status: DC | PRN
  Administered 2019-01-21: 40 [IU] via INTRAVENOUS

## 2019-01-21 MED ORDER — MENTHOL 3 MG MT LOZG
1.0000 | LOZENGE | OROMUCOSAL | Status: DC | PRN
  Filled 2019-01-21: qty 9

## 2019-01-21 MED ORDER — FENTANYL CITRATE (PF) 100 MCG/2ML IJ SOLN
INTRAMUSCULAR | Status: AC
  Filled 2019-01-21: qty 2

## 2019-01-21 MED ORDER — ALBUTEROL SULFATE (2.5 MG/3ML) 0.083% IN NEBU
2.5000 mg | INHALATION_SOLUTION | Freq: Once | RESPIRATORY_TRACT | Status: AC
  Administered 2019-01-21: 2.5 mg via RESPIRATORY_TRACT
  Filled 2019-01-21: qty 3

## 2019-01-21 MED ORDER — COCONUT OIL OIL
1.0000 "application " | TOPICAL_OIL | Status: DC | PRN
  Filled 2019-01-21: qty 120

## 2019-01-21 MED ORDER — CEFAZOLIN SODIUM-DEXTROSE 2-4 GM/100ML-% IV SOLN
2.0000 g | Freq: Once | INTRAVENOUS | Status: AC
  Administered 2019-01-21: 2 g via INTRAVENOUS

## 2019-01-21 MED ORDER — SIMETHICONE 80 MG PO CHEW
80.0000 mg | CHEWABLE_TABLET | ORAL | Status: DC
  Administered 2019-01-23 – 2019-01-24 (×2): 80 mg via ORAL
  Filled 2019-01-21: qty 1

## 2019-01-21 MED ORDER — ONDANSETRON HCL 4 MG/2ML IJ SOLN: 4.0000 mg | Freq: Three times a day (TID) | INTRAMUSCULAR | Status: DC | PRN

## 2019-01-21 MED ORDER — NALOXONE HCL 4 MG/10ML IJ SOLN: 1.0000 ug/kg/h | INTRAVENOUS | Status: DC | PRN

## 2019-01-21 MED ORDER — ONDANSETRON HCL 4 MG/2ML IJ SOLN
INTRAMUSCULAR | Status: AC
  Filled 2019-01-21: qty 2

## 2019-01-21 SURGICAL SUPPLY — 32 items
BENZOIN TINCTURE PRP APPL 2/3 (GAUZE/BANDAGES/DRESSINGS) ×3 IMPLANT
CHLORAPREP W/TINT 26ML (MISCELLANEOUS) ×3 IMPLANT
CLAMP CORD UMBIL (MISCELLANEOUS) IMPLANT
CLOSURE WOUND 1/2 X4 (GAUZE/BANDAGES/DRESSINGS) ×1
CLOTH BEACON ORANGE TIMEOUT ST (SAFETY) ×3 IMPLANT
DRSG OPSITE POSTOP 4X10 (GAUZE/BANDAGES/DRESSINGS) ×3 IMPLANT
ELECT REM PT RETURN 9FT ADLT (ELECTROSURGICAL) ×3
ELECTRODE REM PT RTRN 9FT ADLT (ELECTROSURGICAL) ×1 IMPLANT
EXTRACTOR VACUUM M CUP 4 TUBE (SUCTIONS) IMPLANT
EXTRACTOR VACUUM M CUP 4' TUBE (SUCTIONS)
GLOVE BIOGEL PI IND STRL 7.0 (GLOVE) ×2 IMPLANT
GLOVE BIOGEL PI IND STRL 7.5 (GLOVE) ×2 IMPLANT
GLOVE BIOGEL PI INDICATOR 7.0 (GLOVE) ×4
GLOVE BIOGEL PI INDICATOR 7.5 (GLOVE) ×4
GLOVE ECLIPSE 7.5 STRL STRAW (GLOVE) ×3 IMPLANT
GOWN STRL REUS W/TWL LRG LVL3 (GOWN DISPOSABLE) ×9 IMPLANT
KIT ABG SYR 3ML LUER SLIP (SYRINGE) IMPLANT
NEEDLE HYPO 25X5/8 SAFETYGLIDE (NEEDLE) IMPLANT
NS IRRIG 1000ML POUR BTL (IV SOLUTION) ×3 IMPLANT
PACK C SECTION WH (CUSTOM PROCEDURE TRAY) ×3 IMPLANT
PAD OB MATERNITY 4.3X12.25 (PERSONAL CARE ITEMS) ×3 IMPLANT
PENCIL SMOKE EVAC W/HOLSTER (ELECTROSURGICAL) ×3 IMPLANT
RTRCTR C-SECT PINK 25CM LRG (MISCELLANEOUS) ×3 IMPLANT
STRIP CLOSURE SKIN 1/2X4 (GAUZE/BANDAGES/DRESSINGS) ×2 IMPLANT
SUT VIC AB 0 CTX 36 (SUTURE) ×6
SUT VIC AB 0 CTX36XBRD ANBCTRL (SUTURE) ×3 IMPLANT
SUT VIC AB 2-0 CT1 27 (SUTURE) ×2
SUT VIC AB 2-0 CT1 TAPERPNT 27 (SUTURE) ×1 IMPLANT
SUT VIC AB 4-0 KS 27 (SUTURE) ×3 IMPLANT
TOWEL OR 17X24 6PK STRL BLUE (TOWEL DISPOSABLE) ×3 IMPLANT
TRAY FOLEY W/BAG SLVR 14FR LF (SET/KITS/TRAYS/PACK) ×3 IMPLANT
WATER STERILE IRR 1000ML POUR (IV SOLUTION) ×3 IMPLANT

## 2019-01-21 NOTE — MAU Note (Addendum)
Pt sent from MFM for eval of GDM.  Hasn't been checking sugars secondary to lost glucose meter. Pt currently has the flu and taking Tamiflu.  Pt c/o sore throat too

## 2019-01-21 NOTE — Progress Notes (Signed)
38 year old female G4, P3, [redacted] weeks pregnant with twins for delivery by C-section today.  Has flu and acute hepatic failure .  OB/GYN is concerned about HELLP versus acute fatty liver of pregnancy. She will be evaluated after surgery by OB/GYN to decide destination of transfer , ICU versus stepdown.  If no ventilator the patient will be admitted to stepdown.

## 2019-01-21 NOTE — Progress Notes (Addendum)
PROGRESS NOTE    Teresa Clark  ZOX:[id number] DOB: Jun 25, 1981 DOA: 01/21/2019 PCP: Willodean Rosenthal, MD   Brief Narrative: 38 year old female G4, P3, [redacted] weeks pregnant with twins for delivery by C-section today.  Has flu and acute hepatic failure .  OB/GYN is concerned about HELLP versus acute fatty liver of pregnancy (ALFP) vs viral syndrome.  Is now post-partum.  And has been transferred to SDU here at [facility].   Assessment & Plan:   Principal Problem:   Hepatic failure (HCC) Active Problems:   Influenza B   1. Hepatic failure - 1. HELLP vs AFLP vs viral syndrome 2. C-Section POD #0 3. Fibrinogen pre-op was 319 4. But doesn't really have severe anemia or low platelets typically described in HELLP. 5. Checking LDH, Mg, and Haptoglobin 6. MFM didn't recommend magnesium at this time (see their note) 7. Q12H labs 2. Influenza B - 1. Supportive care 2. Holding tylenol and tamiflu due to LFTs   DVT prophylaxis: None for the moment, patient POD #0 from C-section, and if HELLP then patient at significant risk for bleeding.  Reassess in AM. Code Status: Full Family Communication: Family at bedside Disposition Plan: Home after admit   Consultants:   OB/GYN, MFM  Procedures:   C-Section POD #0  Antimicrobials:  None    Subjective: Patient feeling okay, pain controlled Post op.  Objective: Vitals:   01/21/19 1926 01/21/19 1957 01/21/19 2000 01/21/19 2100  BP: 113/76  105/69 112/76  Pulse: 70  75 72  Resp: 20  (!) 21 (!) 24  Temp:  98.4 F (36.9 C)    TempSrc:  Oral    SpO2: 98%  96% 98%  Weight: 55.4 kg     Height: 5' (1.524 m)       Intake/Output Summary (Last 24 hours) at 01/21/2019 2158 Last data filed at 01/21/2019 2100 Gross per 24 hour  Intake 1467.7 ml  Output 1177 ml  Net 290.7 ml   Filed Weights   01/21/19 1926  Weight: 55.4 kg    Examination:  General exam: Appears calm and comfortable  Respiratory system: Clear to auscultation.  Respiratory effort normal. Cardiovascular system: S1 & S2 heard, RRR. No JVD, murmurs, rubs, gallops or clicks. No pedal edema. Gastrointestinal system: S/p C section earlier this evening Central nervous system: Alert and oriented. No focal neurological deficits. Extremities: Symmetric 5 x 5 power. Skin: No rashes, lesions or ulcers Psychiatry: Judgement and insight appear normal. Mood & affect appropriate.     Data Reviewed: I have personally reviewed following labs and imaging studies  CBC: Recent Labs  Lab 01/21/19 1035 01/21/19 1809  WBC 14.5* 13.6*  NEUTROABS 10.0*  --   HGB 13.6 10.6*  HCT 40.9 33.1*  MCV 105.4* 108.2*  PLT 224 199   Basic Metabolic Panel: Recent Labs  Lab 01/21/19 1035 01/21/19 1809  NA 136 138  K 4.2 4.7  CL 111 113*  CO2 17* 17*  GLUCOSE 83 96  BUN 12 11  CREATININE 1.21* 1.14*  CALCIUM 8.7* 8.0*   GFR: Estimated Creatinine Clearance: 53.3 mL/min (A) (by C-G formula based on SCr of 1.14 mg/dL (H)). Liver Function Tests: Recent Labs  Lab 01/21/19 1035 01/21/19 1809  AST 321* 197*  ALT 449* 269*  ALKPHOS 267* 201*  BILITOT 8.9* 6.6*  PROT 6.8 5.5*  ALBUMIN 2.7* 2.5*   No results for input(s): LIPASE, AMYLASE in the last 168 hours. No results for input(s): AMMONIA in the last 168 hours. Coagulation  Profile: Recent Labs  Lab 01/21/19 1139  INR 0.99   Cardiac Enzymes: No results for input(s): CKTOTAL, CKMB, CKMBINDEX, TROPONINI in the last 168 hours. BNP (last 3 results) No results for input(s): PROBNP in the last 8760 hours. HbA1C: No results for input(s): HGBA1C in the last 72 hours. CBG: Recent Labs  Lab 01/21/19 1327 01/21/19 1436 01/21/19 1538 01/21/19 1834 01/21/19 1949  GLUCAP 176* 105* 78 77 83   Lipid Profile: No results for input(s): CHOL, HDL, LDLCALC, TRIG, CHOLHDL, LDLDIRECT in the last 72 hours. Thyroid Function Tests: No results for input(s): TSH, T4TOTAL, FREET4, T3FREE, THYROIDAB in the last 72  hours. Anemia Panel: No results for input(s): VITAMINB12, FOLATE, FERRITIN, TIBC, IRON, RETICCTPCT in the last 72 hours. Sepsis Labs: Recent Labs  Lab 01/21/19 1331 01/21/19 1821  LATICACIDVEN 1.5 2.9*    Recent Results (from the past 240 hour(s))  Group A Strep by PCR     Status: None   Collection Time: 01/19/19  3:00 AM  Result Value Ref Range Status   Group A Strep by PCR NOT DETECTED NOT DETECTED Final    Comment: Performed at [facility], [address] [place], [place] [address]  MRSA PCR Screening     Status: None   Collection Time: 01/21/19  7:44 PM  Result Value Ref Range Status   MRSA by PCR NEGATIVE NEGATIVE Final    Comment:        The GeneXpert MRSA Assay (FDA approved for NASAL specimens only), is one component of a comprehensive MRSA colonization surveillance program. It is not intended to diagnose MRSA infection nor to guide or monitor treatment for MRSA infections. Performed at [facility], [address] [place], [place] [address]          Radiology Studies: Dg Chest 2 View  Result Date: 01/21/2019 CLINICAL DATA:  Influenza B, cough, congestion EXAM: CHEST - 2 VIEW COMPARISON:  02/15/2017 chest radiograph. FINDINGS: Stable cardiomediastinal silhouette with normal heart size. No pneumothorax. No pleural effusion. Lungs appear clear, with no acute consolidative airspace disease and no pulmonary edema. IMPRESSION: No active cardiopulmonary disease. Electronically Signed   By: Delbert PhenixJason A Poff M.D.   On: 01/21/2019 11:24   [place] Mfm Fetal Bpp W/nonstress Add'l Gest  Result Date: 01/21/2019 ----------------------------------------------------------------------  OBSTETRICS REPORT                       (Signed Final 01/21/2019 09:57 am) ---------------------------------------------------------------------- Patient Info  ID #:       [id number]                          D.O.B.:  1981/12/15 (38 yrs)  Name:       Teresa Clark                      Visit Date:  01/21/2019 08:50 am ---------------------------------------------------------------------- Performed By  Performed By:     Eden Lathearrie Stalter BS      Ref. Address:     [facility]                    RDMS RVT  OB[facility]yn Clinic                                                             [address]                                                             [place], [place]                                                             [address]  Attending:        Noralee Space MD        Location:         [facility]  Referred By:      [facility] for                    [facility]                    Healthcare ---------------------------------------------------------------------- Orders   #  Description                          Code         Ordered By   1  [place] MFM FETAL BPP                     19147.8      Michaelene Song   2  [place] MFM FETAL BPP                     29562.1      Bettey Costa      W/NONSTRESS ADD'L Ammie Dalton  ----------------------------------------------------------------------   #  Order #                    Accession #                 Episode #   1  [id number]  [id number]   2  [id number]                  [id number]                  [id number]  ---------------------------------------------------------------------- Indications   Twin pregnancy, di/di, third trimester         O24.043   Advanced maternal age multigravida 65+,        O46.523   third trimester   Gestational diabetes in pregnancy,             O24.419   unspecified control (poor control)   [redacted] weeks gestation of pregnancy                Z3A.36  ---------------------------------------------------------------------- Vital Signs                                                  Height:        4'11" ---------------------------------------------------------------------- Fetal Evaluation (Fetus A)  Num Of Fetuses:         2  Fetal Heart Rate(bpm):  144  Cardiac Activity:       Observed  Fetal Lie:              Maternal right side  Presentation:           Breech  Amniotic Fluid  AFI FV:      Within normal limits                              Largest Pocket(cm)                              4.4 ---------------------------------------------------------------------- Biophysical Evaluation (Fetus A)  Amniotic F.V:   Within normal limits       F. Tone:        Observed  F. Movement:    Observed                   N.S.T:          Reactive  F. Breathing:   Observed                   Score:          10/10 ---------------------------------------------------------------------- OB History  Gravidity:    4         Term:   3        Prem:   0        SAB:   0  TOP:          0       Ectopic:  0        Living: 3 ---------------------------------------------------------------------- Gestational Age (Fetus A)  LMP:           37w 3d        Date:  05/04/18                 EDD:   02/08/19  Best:          Stevie Kern 3d     Det. By:  Marcella Dubs  EDD:   02/15/19                                      (08/07/18) ---------------------------------------------------------------------- Fetal Evaluation (Fetus B)  Num Of Fetuses:         2  Fetal Heart Rate(bpm):  120  Cardiac Activity:       Observed  Fetal Lie:              Maternal left side  Presentation:           Transverse, head to maternal left  Amniotic Fluid  AFI FV:      Within normal limits                              Largest Pocket(cm)                              4.4 ---------------------------------------------------------------------- Biophysical Evaluation (Fetus B)  Amniotic F.V:   Within normal limits       F. Tone:        Observed  F. Movement:    Observed                   N.S.T:          Reactive  F. Breathing:   Observed                   Score:           10/10 ---------------------------------------------------------------------- Gestational Age (Fetus B)  LMP:           37w 3d        Date:  05/04/18                 EDD:   02/08/19  Best:          Stevie Kern 3d     Det. ByMarcella Dubs         EDD:   02/15/19                                      (08/07/18) ---------------------------------------------------------------------- Impression  Patient with dichorionic-diamniotic twin pregnancy returned  for antenatal testing. We spoke with her with help of  STRATUS interpreter (Burmese language). Her husband was  with her and he understands Burmese better and she speaks  Doyce Loose language better.  She has not been checking her blood glucose to assess  control. Patient has influenza B infection that was confirmed  by nasal swab PCR and takes Tamiflu.  She has mild shortness of breath and feels unwell.  Twin A: Maternal right, BREECH presentation. Amniotic fluid  is normal and good fetal activity is seen. Antenatal testing is  reassuring. NST is reactive. BPP 10/10.  Twin B: Maternal left, transverse lie and head to maternal left.  Amniotic fluid is normal and good fetal activity is seen.  Antenatal testing is reassuring. NST is reactive. BPP 10/10.  I counseled the patient with help of interpreter and  recommended evaluation at the MAU. Patient had detailed  counseling by diabetic educator and her obstetricians and  she is unable to follow the instructions. The benefit of delivery  at 37 weeks' gestation seems to  outweigh potential neonatal  complications. ---------------------------------------------------------------------- Recommendations  -Consider inpatient management for ensuring adequate  treatment of influenza and control of diabetes.  -Recommend delivery at 37 weeks. ----------------------------------------------------------------------                  Noralee Space, MD Electronically Signed Final Report   01/21/2019 09:57 am  ----------------------------------------------------------------------  [place] Mfm Fetal Bpp W/nonstress  Result Date: 01/21/2019 ----------------------------------------------------------------------  OBSTETRICS REPORT                       (Signed Final 01/21/2019 09:57 am) ---------------------------------------------------------------------- Patient Info  ID #:       [id number]                          D.O.B.:  04/25/1981 (38 yrs)  Name:       Teresa Clark                      Visit Date: 01/21/2019 08:50 am ---------------------------------------------------------------------- Performed By  Performed By:     Eden Lathe BS      Ref. Address:     [facility]                    RDMS RVT                                                             OB/Gyn Clinic                                                             [address]                                                             [place], [place]                                                             [address]  Attending:        Noralee Space MD        Location:         [facility]  Referred By:      [facility] for  Women's                    Healthcare ---------------------------------------------------------------------- Orders   #  Description                          Code         Ordered By   1  [place] MFM FETAL BPP                     38177.1      Michaelene Song   2  [place] MFM FETAL BPP                     16579.0      [facility]      W/NONSTRESS ADD'L Ammie Dalton  ----------------------------------------------------------------------   #  Order #                    Accession #                 Episode #   1  [id number]                  [id number]                  [id number]   2  [id number]                  [id number]                  [id number]   ---------------------------------------------------------------------- Indications   Twin pregnancy, di/di, third trimester         O39.043   Advanced maternal age multigravida 69+,        O56.523   third trimester   Gestational diabetes in pregnancy,             O24.419   unspecified control (poor control)   [redacted] weeks gestation of pregnancy                Z3A.36  ---------------------------------------------------------------------- Vital Signs                                                 Height:        4'11" ---------------------------------------------------------------------- Fetal Evaluation (Fetus A)  Num Of Fetuses:         2  Fetal Heart Rate(bpm):  144  Cardiac Activity:       Observed  Fetal Lie:              Maternal right side  Presentation:           Breech  Amniotic Fluid  AFI FV:      Within normal limits  Largest Pocket(cm)                              4.4 ---------------------------------------------------------------------- Biophysical Evaluation (Fetus A)  Amniotic F.V:   Within normal limits       F. Tone:        Observed  F. Movement:    Observed                   N.S.T:          Reactive  F. Breathing:   Observed                   Score:          10/10 ---------------------------------------------------------------------- OB History  Gravidity:    4         Term:   3        Prem:   0        SAB:   0  TOP:          0       Ectopic:  0        Living: 3 ---------------------------------------------------------------------- Gestational Age (Fetus A)  LMP:           37w 3d        Date:  05/04/18                 EDD:   02/08/19  Best:          Stevie Kern 3d     Det. ByMarcella Dubs         EDD:   02/15/19                                      (08/07/18) ---------------------------------------------------------------------- Fetal Evaluation (Fetus B)  Num Of Fetuses:         2  Fetal Heart Rate(bpm):  120  Cardiac Activity:       Observed  Fetal Lie:              Maternal  left side  Presentation:           Transverse, head to maternal left  Amniotic Fluid  AFI FV:      Within normal limits                              Largest Pocket(cm)                              4.4 ---------------------------------------------------------------------- Biophysical Evaluation (Fetus B)  Amniotic F.V:   Within normal limits       F. Tone:        Observed  F. Movement:    Observed                   N.S.T:          Reactive  F. Breathing:   Observed                   Score:          10/10 ---------------------------------------------------------------------- Gestational Age (Fetus B)  LMP:           37w 3d        Date:  05/04/18                 EDD:   02/08/19  Best:          Stevie Kern36w 3d     Det. ByMarcella Dubs:  Early Ultrasound         EDD:   02/15/19                                      (08/07/18) ---------------------------------------------------------------------- Impression  Patient with dichorionic-diamniotic twin pregnancy returned  for antenatal testing. We spoke with her with help of  STRATUS interpreter (Burmese language). Her husband was  with her and he understands Burmese better and she speaks  Doyce Looseoe [place] language better.  She has not been checking her blood glucose to assess  control. Patient has influenza B infection that was confirmed  by nasal swab PCR and takes Tamiflu.  She has mild shortness of breath and feels unwell.  Twin A: Maternal right, BREECH presentation. Amniotic fluid  is normal and good fetal activity is seen. Antenatal testing is  reassuring. NST is reactive. BPP 10/10.  Twin B: Maternal left, transverse lie and head to maternal left.  Amniotic fluid is normal and good fetal activity is seen.  Antenatal testing is reassuring. NST is reactive. BPP 10/10.  I counseled the patient with help of interpreter and  recommended evaluation at the MAU. Patient had detailed  counseling by diabetic educator and her obstetricians and  she is unable to follow the instructions. The benefit of  delivery  at 37 weeks' gestation seems to outweigh potential neonatal  complications. ---------------------------------------------------------------------- Recommendations  -Consider inpatient management for ensuring adequate  treatment of influenza and control of diabetes.  -Recommend delivery at 37 weeks. ----------------------------------------------------------------------                  Noralee Spaceavi Shankar, MD Electronically Signed Final Report   01/21/2019 09:57 am ----------------------------------------------------------------------  [place] Abdomen Limited Ruq  Result Date: 01/21/2019 CLINICAL DATA:  Elevated liver function studies. EXAM: ULTRASOUND ABDOMEN LIMITED RIGHT UPPER QUADRANT COMPARISON:  None. FINDINGS: Gallbladder: The gallbladder is somewhat contracted and thick walled, with wall thickness measuring 6.8 mm. Gallbladder is filled with sludge with central echogenic focus measuring 6 mm diameter, possibly a nonshadowing stone, sludge ball, or polyp. Murphy's sign is negative. Common bile duct: Diameter: 3.3 mm, normal Liver: No focal lesion identified. Within normal limits in parenchymal echogenicity. Portal vein is patent on color Doppler imaging with normal direction of blood flow towards the liver. Incidental note of prominent hydronephrosis of the right kidney, incompletely evaluated. IMPRESSION: 1. Diffuse gallbladder wall thickening with sludge and nonshadowing stone versus polyp centrally. Murphy's sign is negative. Appearance is nonspecific for cholecystitis. No bile duct dilatation. 2. Incidental note of prominent hydronephrosis of the right kidney. Electronically Signed   By: Burman NievesWilliam  Stevens M.D.   On: 01/21/2019 20:57        Scheduled Meds: . [START ON 01/22/2019] enoxaparin (LOVENOX) injection  40 mg Subcutaneous Q24H  . [START ON 01/22/2019] measles, mumps & rubella vaccine  0.5 mL Subcutaneous Once  . [START ON 01/22/2019] prenatal multivitamin  1 tablet Oral Q1200  . scopolamine  1  patch Transdermal Once  . [START ON 01/22/2019] senna-docusate  2 tablet Oral Q24H  . simethicone  80 mg Oral TID PC  . [START ON 01/22/2019] simethicone  80 mg Oral Q24H  . [START ON  01/22/2019] Tdap  0.5 mL Intramuscular Once   Continuous Infusions: . lactated ringers 125 mL/hr at 01/21/19 2100  . naLOXone [facility]) adult infusion for PRURITIS    . oxytocin Stopped (01/21/19 2129)     LOS: 0 days   Hillary Bow., D.O.   If 7PM-7AM, please contact night-coverage [email or web address] 01/21/2019, 9:58 PM

## 2019-01-21 NOTE — H&P (Signed)
Faculty Practice H&P  Teresa Clark is a 38 y.o. female [phone] with IUP at [redacted]w[redacted]d sent to MAU from MFM due to feeling ill and concerns of her blood sugars. She was recently diagnosed with influenza 2 days ago and was prescribed tamiflu, which she has been taking. Her appetite has decreased significantly and she has been trying to keep up on her fluid intake. She has been breathing fast and coughing up purulent sputum. After discussing the case with Dr Judeth Cornfield, delivery was recommended.   Pregnancy complicated by Di-Di twins, breech position baby A, transverse position of Baby B, GDM.   Pt states she has been having mild contractions, no vaginal bleeding, intact membranes, with normal fetal movement.     Prenatal Course Source of Care: [facility] with onset of care at 12 weeks  Pregnancy complications or risks: Patient Active Problem List   Diagnosis Date Noted  . Gestational diabetes 11/28/2018  . Dichorionic diamniotic twin pregnancy, antepartum 08/01/2018  . Pregnancy with uncertain date of last menstrual period in first trimester, antepartum 08/01/2018  . AMA (advanced maternal age) multigravida 35+ 08/01/2018  . Supervision of other normal pregnancy, antepartum 07/31/2018  . Language barrier, cultural differences 02/01/2017   She desires oral progesterone-only contraceptive for contraception.  She plans to breastfeed  Prenatal labs and studies: ABO, Rh: O/Positive/-- (08/15 1219) Antibody: Negative (08/15 1219) Rubella: 4.50 (08/15 1219) RPR: Non Reactive (12/11 0902)  HBsAg: Negative (08/15 1219)  HIV: Non Reactive (12/11 0902)  GBS:    2hr Glucola: positive Genetic screening: decllined Anatomy US: normal  Past Medical History:  Past Medical History:  Diagnosis Date  . Gestational diabetes     Past Surgical History:  Past Surgical History:  Procedure Laterality Date  . NO PAST SURGERIES      Obstetrical History:  OB History    Gravida  4   Para  3   Term  3   Preterm      AB      Living  2     SAB      TAB      Ectopic      Multiple      Live Births  3        Obstetric Comments  One child deceased around 77 1/2 years of age per patient.          Gynecological History:  OB History    Gravida  4   Para  3   Term  3   Preterm      AB      Living  2     SAB      TAB      Ectopic      Multiple      Live Births  3        Obstetric Comments  One child deceased around 45 1/2 years of age per patient.          Social History:  Social History   Socioeconomic History  . Marital status: Married    Spouse name: Not on file  . Number of children: 2  . Years of education: Not on file  . Highest education level: Not on file  Occupational History  . Not on file  Social Needs  . Financial resource strain: Not on file  . Food insecurity:    Worry: Not on file    Inability: Not on file  . Transportation needs:    Medical: Not on  file    Non-medical: Not on file  Tobacco Use  . Smoking status: Never Smoker  . Smokeless tobacco: Never Used  Substance and Sexual Activity  . Alcohol use: No  . Drug use: No  . Sexual activity: Never  Lifestyle  . Physical activity:    Days per week: Not on file    Minutes per session: Not on file  . Stress: Not on file  Relationships  . Social connections:    Talks on phone: Not on file    Gets together: Not on file    Attends religious service: Not on file    Active member of club or organization: Not on file    Attends meetings of clubs or organizations: Not on file    Relationship status: Not on file  Other Topics Concern  . Not on file  Social History Narrative  . Not on file    Family History:  Family History  Family history unknown: Yes    Medications:  Prenatal vitamins,  Current Facility-Administered Medications  Medication Dose Route Frequency Provider Last Rate Last Dose  . 0.9 %  sodium chloride infusion   Intravenous Continuous Rasch, Victorino DikeJennifer  I, NP 999 mL/hr at 01/21/19 1045    . 0.9 %  sodium chloride infusion   Intravenous Continuous Salcha BingPickens, Charlie, MD 125 mL/hr at 01/21/19 1152    . albuterol (PROVENTIL) (2.5 MG/3ML) 0.083% nebulizer solution 2.5 mg  2.5 mg Nebulization Once Elvie Palomo J, DO      . betamethasone acetate-betamethasone sodium phosphate (CELESTONE) injection 12 mg  12 mg Intramuscular Q24 Hr x 2 Pickens, Charlie, MD      . lactated ringers infusion   Intravenous Continuous Levie HeritageStinson, Delorise Hunkele J, DO        Allergies: No Known Allergies  Review of Systems: - coughing, SOB, tachypnea  Physical Exam: Blood pressure 119/81, pulse 75, temperature 98.4 F (36.9 C), temperature source Oral, resp. rate (!) 22, last menstrual period 05/04/2018, SpO2 97 %, unknown if currently breastfeeding. GENERAL: Well-developed, well-nourished female. Jaundiced, ill appearing.  LUNGS: tight breath sounds HEART: Regular rate and rhythm. ABDOMEN: Soft, nontender, nondistended, gravid.  EXTREMITIES: Nontender, no edema, 2+ distal pulses. Presentation: breech / transverse FHT:  Cat 1 tracing x2   Pertinent Labs/Studies:   Lab Results  Component Value Date   WBC 14.5 (H) 01/21/2019   HGB 13.6 01/21/2019   HCT 40.9 01/21/2019   MCV 105.4 (H) 01/21/2019   PLT 224 01/21/2019    Assessment : Teresa Clark is a 38 y.o. [id number] at 8875w3d being admitted for cesarean section secondary to elevated LFTs, acute kidney injury.  Plan: The risks of cesarean section discussed with the patient included but were not limited to: bleeding which may require transfusion or reoperation; infection which may require antibiotics; injury to bowel, bladder, ureters or other surrounding organs; injury to the fetus; need for additional procedures including hysterectomy in the event of a life-threatening hemorrhage; placental abnormalities wth subsequent pregnancies, incisional problems, thromboembolic phenomenon and other postoperative/anesthesia complications.  The patient concurred with the proposed plan, giving informed written consent for the procedure.   Patient has been NPO since 7am and will remain NPO for procedure.  Preoperative prophylactic Ancef ordered on call to the OR.    Levie HeritageStinson, Emogene Muratalla J, DO 01/21/2019, 12:25 PM

## 2019-01-21 NOTE — MAU Note (Signed)
CBG is 73 RN and Provider are notified

## 2019-01-21 NOTE — Progress Notes (Signed)
Report given to carelink, pt transferred via carelink.    Johnathan Hausen RN

## 2019-01-21 NOTE — Anesthesia Procedure Notes (Signed)
Spinal  Patient location during procedure: OR Start time: 01/21/2019 3:56 PM End time: 01/21/2019 4:00 PM Staffing Anesthesiologist: Leilani Able, MD Performed: anesthesiologist  Preanesthetic Checklist Completed: patient identified, site marked, surgical consent, pre-op evaluation, timeout performed, IV checked, risks and benefits discussed and monitors and equipment checked Spinal Block Patient position: sitting Prep: site prepped and draped and DuraPrep Patient monitoring: continuous pulse ox and blood pressure Approach: midline Location: L4-5 Injection technique: single-shot Needle Needle type: Pencan  Needle gauge: 24 G Needle length: 10 cm Needle insertion depth: 5 cm Assessment Sensory level: T4

## 2019-01-21 NOTE — Op Note (Signed)
Cesarean Section Operative Report  PATIENT: Teresa Clark  PROCEDURE DATE: 01/21/2019  PREOPERATIVE DIAGNOSES: Intrauterine pregnancy at [redacted]w[redacted]d weeks gestation; elevated LFTs, AKI in setting of Influenza A; immediate delivery recommended by MFM  POSTOPERATIVE DIAGNOSES: The same  PROCEDURE: Primary Low Transverse Cesarean Section  SURGEON:   Surgeon(s) and Role:    * Levie Heritage, DO - Primary - Attending   Marcy Siren, DO- OB Fellow   INDICATIONS: Teresa Clark is a 38 y.o. [id number] at 108w3d here for cesarean section secondary to the indications listed under preoperative diagnoses; please see preoperative note for further details.  The risks of cesarean section were discussed with the patient including but were not limited to: bleeding which may require transfusion or reoperation; infection which may require antibiotics; injury to bowel, bladder, ureters or other surrounding organs; injury to the fetus; need for additional procedures including hysterectomy in the event of a life-threatening hemorrhage; placental abnormalities wth subsequent pregnancies, incisional problems, thromboembolic phenomenon and other postoperative/anesthesia complications.   The patient concurred with the proposed plan, giving informed written consent for the procedure.    FINDINGS:   Viable female infant in frank breech presentation.  Apgars 8 and 8.  Clear amniotic fluid.  Intact placenta, three vessel cord.    Viable female infant in transverse presentation, delivered breech.  Apgars 8 and 9.  Clear amniotic fluid.  Intact placenta, three vessel cord.    Normal uterus, fallopian tubes and ovaries bilaterally.  ANESTHESIA: Spinal INTRAVENOUS FLUIDS: 500 mL  ESTIMATED BLOOD LOSS: 277 mL URINE OUTPUT:  500 ml SPECIMENS: Placenta sent to L&D COMPLICATIONS: None immediate  PROCEDURE IN DETAIL:  The patient preoperatively received intravenous antibiotics and had sequential compression devices applied to her lower  extremities.  She was then taken to the operating room where spinal anesthesia was administered and was found to be adequate. She was then placed in a dorsal supine position with a leftward tilt, and prepped and draped in a sterile manner.  A foley catheter was placed into her bladder and attached to constant gravity.    After an adequate timeout was performed, a Pfannenstiel skin incision was made with scalpel and carried through to the underlying layer of fascia. The fascia was incised in the midline, and this incision was extended bilaterally using the Mayo scissors.  Kocher clamps were applied to the superior aspect of the fascial incision and the underlying rectus muscles were dissected off bluntly.  A similar process was carried out on the inferior aspect of the fascial incision. The rectus muscles were separated in the midline bluntly and the peritoneum was entered bluntly. Attention was turned to the lower uterine segment where a low transverse hysterotomy was made with a scalpel and extended bilaterally bluntly.  Twin A was successfully delivered, the cord was clamped and cut after one minute, and the infant was handed over to the awaiting neonatology team. Twin B was then delivered, the cord was clamped and cut after one minute, and the infant was handed over to the awaiting neonatology team. Uterine massage was then administered, and the placenta delivered intact with a three-vessel cord. The uterus was then cleared of clots and debris.  The hysterotomy was closed with 0 Vicryl in a running locked fashion, and an imbricating layer was also placed with 0 Vicryl.  The pelvis was cleared of all clot and debris. Hemostasis was confirmed on all surfaces.  The peritoneum was closed with a 2 Vicryl running stitch.. The fascia was  then closed using 0 Vicryl in a running fashion.  The subcutaneous layer was irrigated.  The skin was closed with a 4-0 Vicryl subcuticular stitch.   The patient tolerated the  procedure well. Sponge, lap, instrument and needle counts were correct x 3.  She was taken to the recovery room in stable condition.   An experienced assistant was required given the standard of surgical care given the complexity of the case.  This assistant was needed for exposure, dissection, suctioning, retraction, instrument exchange, assisting with delivery with administration of fundal pressure, and for overall help during the procedure.   Maternal Disposition: PACU - guarded condition. Transfer orders placed for SDU at [facility].   Infants' Disposition: stable   Marcy Siren, D.O. OB Fellow  01/21/2019, 4:54 PM

## 2019-01-21 NOTE — ED Notes (Signed)
Interpreter [phone] utilized during visit in MFM.  Pt had a +flu test on 01/19/19, not feeling well, unable to eat and sleep.  Accompanied by her husband who is very concerned about his wife.  He has quit his job to care for her and their children, he is requesting a note for work.  Pt not checking her blood sugars, she left her device in the clinic last week.  Dr. Judeth Cornfield notified.

## 2019-01-21 NOTE — MAU Note (Signed)
CBG 176 RN notified will be repeated at 1430

## 2019-01-21 NOTE — Progress Notes (Signed)
Pt. Given HHN with Albuterol. BBS clear and equal. HR remained stable throughout- 66-72. RR at 14. Pt. Tolerated well.

## 2019-01-21 NOTE — Progress Notes (Signed)
Chart Note (MFM)  We transferred the patient to MAU for further evaluation because of her shortness of breath. She tested positive for influenza B virus infection and takes Tamiflu. Dr. Vergie Living called to discuss her results.  Labs: ALT 449, AST 321, total bilirubin 8.9 (hight) creatinine 1.21, electrolytes normal, glucose 83 mg/dL, PT 13, APTT 41 (high), fibrinogen 319.  X-ray chest normal.  BP 111/78 mm Hg, pulse 73, RR 23, O2 sat 97%.  Differential diagnoses include preeclampsia, acute fatty liver or viral syndromes. Patient has normal BP and platelets. She does not have abdominal pain or vomiting.  She has twin pregnancy at 36w 3d gestation. The cause of increased liver enzymes and abnormal renal function is less-likely from influenza complication. It is a sudden onset.  I feel delivery is likely to improve maternal conditions including resolution of liver enzymes and creatinine. I am reluctant to recommend magnesium sulfate prophylaxis (increased creatinine) in the absence of increased blood pressures and the cardinal symptoms of severe features.   I recommend delivery.   There is a likelihood of intensive care unit management of mother after delivery. Appropriate consultation after delivery may be obtained.  Recommend foley catheter.

## 2019-01-21 NOTE — MAU Provider Note (Signed)
History     CSN: [id number]  Arrival date and time: 01/21/19 [id number]   First Provider Initiated Contact with Patient 01/21/19 1058      Chief Complaint  Patient presents with  . GDM Eval - poor control   HPI    Ms.Teresa Clark is a 38 y.o. female [phone] @ 7076w3d di di twins here from MFM due to poor control of BS. The patient was diagnosed with influenza B 2 days ago and is currently taking tamiflu. The husband reports she started feeling bad with fever 5 days ago and her symptoms have gotten worse. She recently lost her glucose meter and has not been checking her BS.  Patient reports sore throat and coughing up purulent, thick sputum.   OB History    Gravida  4   Para  3   Term  3   Preterm      AB      Living  2     SAB      TAB      Ectopic      Multiple      Live Births  3        Obstetric Comments  One child deceased around 361 1/2 years of age per patient.          Past Medical History:  Diagnosis Date  . Gestational diabetes     Past Surgical History:  Procedure Laterality Date  . NO PAST SURGERIES      Family History  Family history unknown: Yes    Social History   Tobacco Use  . Smoking status: Never Smoker  . Smokeless tobacco: Never Used  Substance Use Topics  . Alcohol use: No  . Drug use: No    Allergies: No Known Allergies  Facility-Administered Medications Prior to Admission  Medication Dose Route Frequency Provider Last Rate Last Dose  . 0.9 %  sodium chloride infusion  500 mL Intravenous Continuous Charlie Pitteranis, Henry L III, MD       Medications Prior to Admission  Medication Sig Dispense Refill Last Dose  . ACCU-CHEK FASTCLIX LANCETS MISC Use as directed to check blood glucose four times daily. 100 each 0 Taking  . Acetaminophen (TYLENOL PO) Take by mouth.   Taking  . glucose blood (ACCU-CHEK GUIDE) test strip Use as instructed QID 100 each 12 Taking  . oseltamivir (TAMIFLU) 75 MG capsule Take 1 capsule (75 mg total) by mouth  every 12 (twelve) hours. 10 capsule 0 Taking  . Prenat w/o A Vit-FeFum-FePo-FA (FOLIVANE-OB) 130-92.4-1 MG CAPS Take 1 tablet by mouth daily. 60 capsule 6 Taking   Results for orders placed or performed during the hospital encounter of 01/21/19 (from the past 48 hour(s))  Glucose, capillary     Status: None   Collection Time: 01/21/19 10:34 AM  Result Value Ref Range   Glucose-Capillary 83 70 - 99 mg/dL  Comprehensive metabolic panel     Status: Abnormal   Collection Time: 01/21/19 10:35 AM  Result Value Ref Range   Sodium 136 135 - 145 mmol/L   Potassium 4.2 3.5 - 5.1 mmol/L   Chloride 111 98 - 111 mmol/L   CO2 17 (L) 22 - 32 mmol/L   Glucose, Bld 83 70 - 99 mg/dL   BUN 12 6 - 20 mg/dL   Creatinine, Ser 6.571.21 (H) 0.44 - 1.00 mg/dL   Calcium 8.7 (L) 8.9 - 10.3 mg/dL   Total Protein 6.8 6.5 - 8.1 g/dL   Albumin  2.7 (L) 3.5 - 5.0 g/dL   AST 161321 (H) 15 - 41 U/L   ALT 449 (H) 0 - 44 U/L   Alkaline Phosphatase 267 (H) 38 - 126 U/L   Total Bilirubin 8.9 (H) 0.3 - 1.2 mg/dL   GFR calc non Af Amer 58 (L) >60 mL/min   GFR calc Af Amer >60 >60 mL/min   Anion gap 8 5 - 15    Comment: Performed at [facility], [address] [place], [place] [address]  CBC     Status: Abnormal   Collection Time: 01/21/19 10:35 AM  Result Value Ref Range   WBC 14.5 (H) 4.0 - 10.5 K/uL   RBC 3.88 3.87 - 5.11 MIL/uL   Hemoglobin 13.6 12.0 - 15.0 g/dL   HCT 54.040.9 98.136.0 - 19.146.0 %   MCV 105.4 (H) 80.0 - 100.0 fL   MCH 35.1 (H) 26.0 - 34.0 pg   MCHC 33.3 30.0 - 36.0 g/dL   RDW 47.813.8 29.511.5 - 62.115.5 %   Platelets 224 150 - 400 K/uL   nRBC 4.1 (H) 0.0 - 0.2 %    Comment: Performed at [facility], [address] [place], [place] [address]  Urinalysis, Routine w reflex microscopic     Status: Abnormal   Collection Time: 01/21/19 11:24 AM  Result Value Ref Range   Color, Urine YELLOW YELLOW   APPearance CLEAR CLEAR   Specific Gravity, Urine <1.005 (L) 1.005 - 1.030   pH 6.5 5.0 - 8.0   Glucose, UA  NEGATIVE NEGATIVE mg/dL   Hgb urine dipstick NEGATIVE NEGATIVE   Bilirubin Urine SMALL (A) NEGATIVE   Ketones, ur NEGATIVE NEGATIVE mg/dL   Protein, ur NEGATIVE NEGATIVE mg/dL   Nitrite NEGATIVE NEGATIVE   Leukocytes, UA TRACE (A) NEGATIVE    Comment: Performed at [facility], [address] [place], [place] [address]  Urinalysis, Microscopic (reflex)     Status: Abnormal   Collection Time: 01/21/19 11:24 AM  Result Value Ref Range   RBC / HPF 0-5 0 - 5 RBC/hpf   WBC, UA 0-5 0 - 5 WBC/hpf   Bacteria, UA FEW (A) NONE SEEN   Squamous Epithelial / LPF 0-5 0 - 5   Mucus PRESENT     Comment: Performed at [facility], [address] [place], [place] [address]  Blood gas, arterial     Status: Abnormal   Collection Time: 01/21/19 11:30 AM  Result Value Ref Range   FIO2 0.21    O2 Content ROOM AIR L/min   pH, Arterial 7.358 7.350 - 7.450   pCO2 arterial 27.9 (L) 32.0 - 48.0 mmHg   pO2, Arterial 103 83.0 - 108.0 mmHg   Bicarbonate 15.3 (L) 20.0 - 28.0 mmol/L   Acid-base deficit 8.5 (H) 0.0 - 2.0 mmol/L   O2 Saturation 100.0 %   Collection site RIGHT RADIAL    Drawn by [phone]    Sample type ARTERIAL    Allens test (pass/fail) PASS PASS    Comment: Performed at [facility], [address] [place], [place] [address]  Type and screen [facility]     Status: None   Collection Time: 01/21/19 11:39 AM  Result Value Ref Range   ABO/RH(D) O POS    Antibody Screen NEG    Sample Expiration      01/24/2019 Performed at [facility], [address] [place], [place] [address]   Fibrinogen     Status: None   Collection Time: 01/21/19  11:39 AM  Result Value Ref Range   Fibrinogen 319 210 - 475 mg/dL    Comment: Performed at [facility], [address], [place], [place] [address]  APTT     Status: Abnormal   Collection Time: 01/21/19 11:39 AM  Result Value Ref Range   aPTT 41 (H) 24 - 36 seconds    Comment:        IF BASELINE aPTT IS  ELEVATED, SUGGEST PATIENT RISK ASSESSMENT BE USED TO DETERMINE APPROPRIATE ANTICOAGULANT THERAPY. Performed at [facility], [address] [place], [place] [address]   Protime-INR     Status: None   Collection Time: 01/21/19 11:39 AM  Result Value Ref Range   Prothrombin Time 13.0 11.4 - 15.2 seconds   INR 0.99     Comment: Performed at [facility], [address], [place], [place] [address]  Glucose, capillary     Status: None   Collection Time: 01/21/19 11:47 AM  Result Value Ref Range   Glucose-Capillary 72 70 - 99 mg/dL   Review of Systems  Constitutional: Positive for activity change and appetite change.  HENT: Positive for sore throat and voice change.   Respiratory: Positive for cough.    Physical Exam   Blood pressure 119/81, pulse 75, temperature 98.4 F (36.9 C), temperature source Oral, resp. rate (!) 22, last menstrual period 05/04/2018, SpO2 97 %, unknown if currently breastfeeding.  Physical Exam  Constitutional: She is oriented to person, place, and time. She has a sickly appearance. She appears ill. She appears distressed.  Eyes:  + jaundice   Respiratory: Accessory muscle usage present. Tachypnea noted. She is in respiratory distress. She has decreased breath sounds in the right upper field and the left upper field. She has no wheezes. She has no rhonchi.  Musculoskeletal: Normal range of motion.  Neurological: She is alert and oriented to person, place, and time.  Skin: Skin is warm.   Fetal Tracing Fetus A Baseline: 130 bpm Variability: Moderate  Accelerations: 15x15 Decelerations: None Toco: None  Fetal Tracing Fetus B Baseline: 125 bpm  Variability: Moderate  Accelerations: 15x15 Decelerations: None  MAU Course  Procedures  None  MDM  Chest X ray ordered Cbc with diff, CMP BP WNL  Lab results reviewed with Dr. Vergie Living.  Dr. Adrian Blackwater down to MAU to evaluate the patient and prepare patient for OR.  CBG's Q hour while in MAU ABG  ordered.   Assessment and Plan   Dr. Adrian Blackwater to resume care of the patient. Prepare for OR   Teresa Clark, Harolyn Rutherford, NP 01/21/2019 12:48 PM

## 2019-01-21 NOTE — Transfer of Care (Signed)
Immediate Anesthesia Transfer of Care Note  Patient: Teresa Clark  Procedure(s) Performed: CESAREAN SECTION (N/A )  Patient Location: PACU  Anesthesia Type:Spinal  Level of Consciousness: lethargic  Airway & Oxygen Therapy: Patient Spontanous Breathing  Post-op Assessment: Report given to RN  Post vital signs: Reviewed and stable  Last Vitals:  Vitals Value Taken Time  BP    Temp    Pulse    Resp    SpO2      Last Pain:  Vitals:   01/21/19 1334  TempSrc: Oral  PainSc:          Complications: No apparent anesthesia complications

## 2019-01-21 NOTE — ED Notes (Signed)
Report called to [place], Charity fundraiser.  Pt to MAU for evaluation.

## 2019-01-21 NOTE — Progress Notes (Signed)
CBG is 85. ?

## 2019-01-21 NOTE — Procedures (Signed)
Teresa Clark 21-Jul-1981 [redacted]w[redacted]d  Fetus A Non-Stress Test Interpretation for 01/21/19  Indication: Gestational Diabetes medication controlled Teresa Clark 1981/11/03 [redacted]w[redacted]d   Fetus B Non-Stress Test Interpretation for 01/21/19  Indication: Gestational Diabetes medication controlled  Fetal Heart Rate Fetus B Mode: External Baseline Rate (B): 125 BPM Variability: Moderate Accelerations: 15 x 15 Decelerations: None  Uterine Activity Mode: Toco Contraction Frequency (min): one UC noted Contraction Duration (sec): 90 Contraction Quality: Mild Resting Tone Palpated: Relaxed Resting Time: Adequate  Interpretation (Baby B - Fetal Testing) Nonstress Test Interpretation (Baby B): Reactive Comments (Baby B): FHR tracing rev'd by Dr. Judeth Cornfield    Fetal Heart Rate A Mode: External Baseline Rate (A): 125 bpm Variability: Moderate Accelerations: 15 x 15 Decelerations: None Multiple birth?: Yes  Uterine Activity Mode: Toco Contraction Frequency (min): one UC noted Contraction Duration (sec): 90 Contraction Quality: Mild Resting Tone Palpated: Relaxed Resting Time: Adequate  Interpretation (Fetal Testing) Comments: FHR tracing rev'd by Dr. Judeth Cornfield

## 2019-01-22 ENCOUNTER — Inpatient Hospital Stay (HOSPITAL_COMMUNITY): Payer: Medicaid Other

## 2019-01-22 ENCOUNTER — Encounter (HOSPITAL_COMMUNITY): Payer: Self-pay | Admitting: Family Medicine

## 2019-01-22 DIAGNOSIS — K7589 Other specified inflammatory liver diseases: Secondary | ICD-10-CM

## 2019-01-22 LAB — COMPREHENSIVE METABOLIC PANEL
ALK PHOS: 176 U/L — AB (ref 38–126)
ALT: 202 U/L — ABNORMAL HIGH (ref 0–44)
ALT: 156 U/L — ABNORMAL HIGH (ref 0–44)
AST: 137 U/L — ABNORMAL HIGH (ref 15–41)
AST: 103 U/L — ABNORMAL HIGH (ref 15–41)
Albumin: 2 g/dL — ABNORMAL LOW (ref 3.5–5.0)
Albumin: 1.9 g/dL — ABNORMAL LOW (ref 3.5–5.0)
Alkaline Phosphatase: 163 U/L — ABNORMAL HIGH (ref 38–126)
Anion gap: 12 (ref 5–15)
Anion gap: 8 (ref 5–15)
BILIRUBIN TOTAL: 5.3 mg/dL — AB (ref 0.3–1.2)
BILIRUBIN TOTAL: 4.4 mg/dL — AB (ref 0.3–1.2)
BUN: 6 mg/dL (ref 6–20)
BUN: 12 mg/dL (ref 6–20)
CHLORIDE: 109 mmol/L (ref 98–111)
CO2: 15 mmol/L — ABNORMAL LOW (ref 22–32)
CO2: 20 mmol/L — ABNORMAL LOW (ref 22–32)
CREATININE: 1.21 mg/dL — AB (ref 0.44–1.00)
Calcium: 8.6 mg/dL — ABNORMAL LOW (ref 8.9–10.3)
Calcium: 8.7 mg/dL — ABNORMAL LOW (ref 8.9–10.3)
Chloride: 110 mmol/L (ref 98–111)
Creatinine, Ser: 1.28 mg/dL — ABNORMAL HIGH (ref 0.44–1.00)
GFR calc Af Amer: >60 mL/min (ref >60)
GFR calc Af Amer: >60 mL/min (ref >60)
GFR calc non Af Amer: 58 mL/min — ABNORMAL LOW (ref >60)
GFR, EST NON AFRICAN AMERICAN: 54 mL/min — AB (ref >60)
Glucose, Bld: 101 mg/dL — ABNORMAL HIGH (ref 70–99)
Glucose, Bld: 114 mg/dL — ABNORMAL HIGH (ref 70–99)
Potassium: 5.1 mmol/L (ref 3.5–5.1)
Potassium: 4.8 mmol/L (ref 3.5–5.1)
Sodium: 137 mmol/L (ref 135–145)
Sodium: 137 mmol/L (ref 135–145)
Total Protein: 4.9 g/dL — ABNORMAL LOW (ref 6.5–8.1)
Total Protein: 4.8 g/dL — ABNORMAL LOW (ref 6.5–8.1)

## 2019-01-22 LAB — CBC
HCT: 31 % — ABNORMAL LOW (ref 36.0–46.0)
HEMATOCRIT: 29.4 % — AB (ref 36.0–46.0)
Hemoglobin: 10 g/dL — ABNORMAL LOW (ref 12.0–15.0)
Hemoglobin: 9.2 g/dL — ABNORMAL LOW (ref 12.0–15.0)
MCH: 35 pg — AB (ref 26.0–34.0)
MCH: 33.8 pg (ref 26.0–34.0)
MCHC: 32.3 g/dL (ref 30.0–36.0)
MCHC: 31.3 g/dL (ref 30.0–36.0)
MCV: 108.4 fL — ABNORMAL HIGH (ref 80.0–100.0)
MCV: 108.1 fL — AB (ref 80.0–100.0)
PLATELETS: 210 10*3/uL (ref 150–400)
Platelets: 180 10*3/uL (ref 150–400)
RBC: 2.86 MIL/uL — ABNORMAL LOW (ref 3.87–5.11)
RBC: 2.72 MIL/uL — ABNORMAL LOW (ref 3.87–5.11)
RDW: 13.4 % (ref 11.5–15.5)
RDW: 13.5 % (ref 11.5–15.5)
WBC: 25.4 10*3/uL — ABNORMAL HIGH (ref 4.0–10.5)
WBC: 32.9 10*3/uL — ABNORMAL HIGH (ref 4.0–10.5)
nRBC: 4.1 % — ABNORMAL HIGH (ref 0.0–0.2)
nRBC: 5.7 % — ABNORMAL HIGH (ref 0.0–0.2)

## 2019-01-22 LAB — PATHOLOGIST SMEAR REVIEW

## 2019-01-22 LAB — GLUCOSE, CAPILLARY
GLUCOSE-CAPILLARY: 85 mg/dL (ref 70–99)
Glucose-Capillary: 104 mg/dL — ABNORMAL HIGH (ref 70–99)
Glucose-Capillary: 117 mg/dL — ABNORMAL HIGH (ref 70–99)
Glucose-Capillary: 139 mg/dL — ABNORMAL HIGH (ref 70–99)
Glucose-Capillary: 162 mg/dL — ABNORMAL HIGH (ref 70–99)
Glucose-Capillary: 94 mg/dL (ref 70–99)
Glucose-Capillary: 99 mg/dL (ref 70–99)

## 2019-01-22 LAB — PROCALCITONIN: Procalcitonin: 7.81 ng/mL

## 2019-01-22 LAB — HEPATITIS PANEL, ACUTE
HCV Ab: <0.1 s/co ratio (ref 0.0–0.9)
Hep A IgM: NEGATIVE
Hep B C IgM: NEGATIVE
Hepatitis B Surface Ag: NEGATIVE

## 2019-01-22 LAB — ABO/RH: ABO/RH(D): O POS

## 2019-01-22 LAB — TYPE AND SCREEN
ABO/RH(D): O POS
Antibody Screen: NEGATIVE

## 2019-01-22 LAB — BILIRUBIN, DIRECT: BILIRUBIN DIRECT: 3.2 mg/dL — AB (ref 0.0–0.2)

## 2019-01-22 LAB — LACTATE DEHYDROGENASE: LDH: 322 U/L — ABNORMAL HIGH (ref 98–192)

## 2019-01-22 LAB — MAGNESIUM: Magnesium: 2.1 mg/dL (ref 1.7–2.4)

## 2019-01-22 MED ORDER — IOPAMIDOL (ISOVUE-370) INJECTION 76%
100.0000 mL | Freq: Once | INTRAVENOUS | Status: AC | PRN
  Administered 2019-01-22: 100 mL via INTRAVENOUS

## 2019-01-22 NOTE — Anesthesia Preprocedure Evaluation (Signed)
Anesthesia Evaluation  Patient identified by MRN, date of birth, ID band Patient awake    Reviewed: Allergy & Precautions, NPO status , Patient's Chart, lab work & pertinent test results  Airway Mallampati: I       Dental no notable dental hx. (+) Teeth Intact   Pulmonary  Influenza A  Tachypnea  breath sounds clear to auscultation       Cardiovascular negative cardio ROS   Rhythm:Regular Rate:Tachycardia     Neuro/Psych negative neurological ROS  negative psych ROS   GI/Hepatic negative GI ROS, Neg liver ROS,   Endo/Other  diabetes  Renal/GU negative Renal ROS     Musculoskeletal negative musculoskeletal ROS (+)   Abdominal Normal abdominal exam  (+)   Peds  Hematology negative hematology ROS (+)   Anesthesia Other Findings   Reproductive/Obstetrics (+) Pregnancy                             Anesthesia Physical Anesthesia Plan  ASA: II  Anesthesia Plan: Spinal   Post-op Pain Management:    Induction:   PONV Risk Score and Plan: 3 and Ondansetron, Dexamethasone and Scopolamine patch - Pre-op  Airway Management Planned: Natural Airway and Nasal Cannula  Additional Equipment:   Intra-op Plan:   Post-operative Plan:   Informed Consent: I have reviewed the patients History and Physical, chart, labs and discussed the procedure including the risks, benefits and alternatives for the proposed anesthesia with the patient or authorized representative who has indicated his/her understanding and acceptance.       Plan Discussed with: CRNA and Surgeon  Anesthesia Plan Comments:         Anesthesia Quick Evaluation

## 2019-01-22 NOTE — Progress Notes (Signed)
PROGRESS NOTE    Teresa Clark  ZOX:[id number] DOB: September 08, 1981 DOA: 01/21/2019 PCP: Willodean Rosenthal, MD   Brief Narrative:  38 year old with  past medical history relevant for di-di-twins at 45 and 5 status post emergency C-section on 01/21/2019 for elevated LFTs with pregnancy complicated by gestational diabetes, recent diagnosis of influenza B infection on oseltamivir who was transferred here from [facility] for elevated LFTs concerning for help syndrome versus acute fatty liver of pregnancy versus viral prodrome.   Assessment & Plan:   Principal Problem:   Hepatic failure (HCC) Active Problems:   Influenza B   #) Mixed hepatic and cholestatic pattern of liver injury: Signs and symptoms are almost completely consistent with likely pregnancy help syndrome versus acute fatty liver pregnancy.  Patient has no right upper quadrant pain does not localize well.  Additionally certainly flu or oseltamivir can do this.  Her LFTs are downtrending nicely after the pregnancy. - Continue monitoring -We will discuss with GI -Acute hepatitis panel negative -Right upper quadrant ultrasound showed only some sludging of the gallbladder as well as incidental right hydronephrosis  #) Influenza B infection: Patient was diagnosed on 01/19/2019 after feeling poorly as well as having URI symptoms. - Patient apparently is completed a course of oseltamivir  #) Elevated procalcitonin: This was obtained due to elevated white blood cell count.  At this time it is unclear of the exact etiology of this.  Patient has no evidence of systemic infection and certainly no signs or symptoms of cholangitis. -We will monitor for sepsis  #) Hydronephrosis: Incidental hydronephrosis seen on right upper quadrant ultrasound.  Creatinine appears to be appropriate if a little bit high.  Unclear if this is related to her recent pregnancy or not. -CT abdomen pelvis ordered  #) Status post C-section for twins: -OB/GYN  consult  Fluids: IV fluids Electrolytes: Monitor and supplement Nutrition: Regular diet  Prophylaxis: Enoxaparin  Disposition: Pending downtrending LFTs  Full code   Consultants:   OB/GYN  Gastroenterology Adolph Pollack  Procedures:   None  Antimicrobials: (  None   Subjective: Using bedside interpreter patient reports she is doing fairly well other than some abdominal distention and discomfort.  She denies any nausea, vomiting, diarrhea, fevers, cough, congestion.  Objective: Vitals:   01/22/19 0600 01/22/19 0700 01/22/19 0800 01/22/19 0900  BP: 105/74 100/72 107/85 100/78  Pulse: (!) 56 (!) 59 (!) 55 (!) 58  Resp: 19 18 15 18   Temp:  98 F (36.7 C)    TempSrc:  Oral    SpO2: 97% 95% 97% 97%  Weight:      Height:        Intake/Output Summary (Last 24 hours) at 01/22/2019 1006 Last data filed at 01/22/2019 0900 Gross per 24 hour  Intake 2958.77 ml  Output 2177 ml  Net 781.77 ml   Filed Weights   01/21/19 1926  Weight: 55.4 kg    Examination:  General exam: Appears calm and comfortable  Respiratory system: Clear to auscultation. Respiratory effort normal. Cardiovascular system: Regular rate and rhythm, no murmurs Gastrointestinal system: Soft, mildly distended but no tenderness except around incision site, plus bowel sounds, no rebound or guarding. Central nervous system: Alert and oriented.  Grossly intact, moving all extremities Extremities: Trace lower extremity edema Skin: Well-healed low transverse incision Psychiatry: Judgement and insight appear normal. Mood & affect appropriate.     Data Reviewed: I have personally reviewed following labs and imaging studies  CBC: Recent Labs  Lab 01/21/19 1035  01/21/19 1809 01/22/19 0420  WBC 14.5* 13.6* 25.4*  NEUTROABS 10.0*  --   --   HGB 13.6 10.6* 10.0*  HCT 40.9 33.1* 31.0*  MCV 105.4* 108.2* 108.4*  PLT 224 199 180   Basic Metabolic Panel: Recent Labs  Lab 01/21/19 1035 01/21/19 1809  01/22/19 0420  NA 136 138 137  K 4.2 4.7 5.1  CL 111 113* 110  CO2 17* 17* 15*  GLUCOSE 83 96 101*  BUN 12 11 6   CREATININE 1.21* 1.14* 1.21*  CALCIUM 8.7* 8.0* 8.6*  MG  --   --  2.1   GFR: Estimated Creatinine Clearance: 50.2 mL/min (A) (by C-G formula based on SCr of 1.21 mg/dL (H)). Liver Function Tests: Recent Labs  Lab 01/21/19 1035 01/21/19 1809 01/22/19 0420  AST 321* 197* 137*  ALT 449* 269* 202*  ALKPHOS 267* 201* 176*  BILITOT 8.9* 6.6* 5.3*  PROT 6.8 5.5* 4.9*  ALBUMIN 2.7* 2.5* 2.0*   No results for input(s): LIPASE, AMYLASE in the last 168 hours. No results for input(s): AMMONIA in the last 168 hours. Coagulation Profile: Recent Labs  Lab 01/21/19 1139  INR 0.99   Cardiac Enzymes: No results for input(s): CKTOTAL, CKMB, CKMBINDEX, TROPONINI in the last 168 hours. BNP (last 3 results) No results for input(s): PROBNP in the last 8760 hours. HbA1C: No results for input(s): HGBA1C in the last 72 hours. CBG: Recent Labs  Lab 01/21/19 1834 01/21/19 1949 01/22/19 0013 01/22/19 0402 01/22/19 0748  GLUCAP 77 83 117* 104* 99   Lipid Profile: No results for input(s): CHOL, HDL, LDLCALC, TRIG, CHOLHDL, LDLDIRECT in the last 72 hours. Thyroid Function Tests: No results for input(s): TSH, T4TOTAL, FREET4, T3FREE, THYROIDAB in the last 72 hours. Anemia Panel: No results for input(s): VITAMINB12, FOLATE, FERRITIN, TIBC, IRON, RETICCTPCT in the last 72 hours. Sepsis Labs: Recent Labs  Lab 01/21/19 1331 01/21/19 1821 01/22/19 0743  PROCALCITON  --   --  7.81  LATICACIDVEN 1.5 2.9*  --     Recent Results (from the past 240 hour(s))  Group A Strep by PCR     Status: None   Collection Time: 01/19/19  3:00 AM  Result Value Ref Range Status   Group A Strep by PCR NOT DETECTED NOT DETECTED Final    Comment: Performed at [facility], [address], [place], [place] [address]  MRSA PCR Screening     Status: None   Collection Time: 01/21/19  7:44  PM  Result Value Ref Range Status   MRSA by PCR NEGATIVE NEGATIVE Final    Comment:        The GeneXpert MRSA Assay (FDA approved for NASAL specimens only), is one component of a comprehensive MRSA colonization surveillance program. It is not intended to diagnose MRSA infection nor to guide or monitor treatment for MRSA infections. Performed at [facility], [address], [place], [place] [address]          Radiology Studies: Dg Chest 2 View  Result Date: 01/21/2019 CLINICAL DATA:  Influenza B, cough, congestion EXAM: CHEST - 2 VIEW COMPARISON:  02/15/2017 chest radiograph. FINDINGS: Stable cardiomediastinal silhouette with normal heart size. No pneumothorax. No pleural effusion. Lungs appear clear, with no acute consolidative airspace disease and no pulmonary edema. IMPRESSION: No active cardiopulmonary disease. Electronically Signed   By: Delbert Phenix M.D.   On: 01/21/2019 11:24   [place] Mfm Fetal Bpp W/nonstress Add'l Gest  Result Date: 01/21/2019 ----------------------------------------------------------------------  OBSTETRICS  REPORT                       (Signed Final 01/21/2019 09:57 am) ---------------------------------------------------------------------- Patient Info  ID #:       [id number]                          D.O.B.:  1981-07-01 (38 yrs)  Name:       Teresa Clark                      Visit Date: 01/21/2019 08:50 am ---------------------------------------------------------------------- Performed By  Performed By:     Eden Lathe BS      Ref. Address:     [facility]                    RDMS RVT                                                             OB/Gyn Clinic                                                             [address]                                                             [place], [place]                                                             [address]  Attending:        Noralee Space MD        Location:         [facility]  Referred By:      [facility] for                    [facility]                    Healthcare ---------------------------------------------------------------------- Orders   #  Description                          Code  Ordered By   1  [place] MFM FETAL BPP                     75643.3      Michaelene Song   2  [place] MFM FETAL BPP                     29518.8      [facility]      W/NONSTRESS ADD'L GEST                            BOOKER  ----------------------------------------------------------------------   #  Order #                    Accession #                 Episode #   1  [id number]                  [id number]   2  [id number]                  [id number]                  [id number]  ---------------------------------------------------------------------- Indications   Twin pregnancy, di/di, third trimester         O44.043   Advanced maternal age multigravida 66+,        O80.523   third trimester   Gestational diabetes in pregnancy,             O24.419   unspecified control (poor control)   [redacted] weeks gestation of pregnancy                Z3A.36  ---------------------------------------------------------------------- Vital Signs                                                 Height:        4'11" ---------------------------------------------------------------------- Fetal Evaluation (Fetus A)  Num Of Fetuses:         2  Fetal Heart Rate(bpm):  144  Cardiac Activity:       Observed  Fetal Lie:              Maternal right side  Presentation:           Breech  Amniotic Fluid  AFI FV:      Within normal limits                              Largest Pocket(cm)                              4.4 ---------------------------------------------------------------------- Biophysical Evaluation (Fetus A)  Amniotic F.V:   Within normal limits       F. Tone:        Observed  F. Movement:    Observed                   N.S.T:          Reactive  F. Breathing:   Observed                   Score:          10/10 ---------------------------------------------------------------------- OB History  Gravidity:    4         Term:   3        Prem:   0        SAB:   0  TOP:          0       Ectopic:  0        Living: 3 ---------------------------------------------------------------------- Gestational Age (Fetus A)  LMP:           37w 3d        Date:  05/04/18                 EDD:   02/08/19  Best:          Stevie Kern 3d     Det. ByMarcella Dubs         EDD:   02/15/19                                      (08/07/18) ---------------------------------------------------------------------- Fetal Evaluation (Fetus B)  Num Of Fetuses:         2  Fetal Heart Rate(bpm):  120  Cardiac Activity:       Observed  Fetal Lie:              Maternal left side  Presentation:           Transverse, head to maternal left  Amniotic Fluid  AFI FV:      Within normal limits                              Largest Pocket(cm)                              4.4 ---------------------------------------------------------------------- Biophysical Evaluation (Fetus B)  Amniotic F.V:   Within normal limits       F. Tone:        Observed  F. Movement:    Observed                   N.S.T:          Reactive  F. Breathing:   Observed                   Score:          10/10 ---------------------------------------------------------------------- Gestational Age (Fetus B)  LMP:           37w 3d        Date:  05/04/18                 EDD:   02/08/19  Best:          Stevie Kern 3d     Det. ByMarcella Dubs         EDD:   02/15/19                                      (  08/07/18) ---------------------------------------------------------------------- Impression  Patient with dichorionic-diamniotic twin pregnancy returned  for antenatal testing. We spoke with her with help of  STRATUS interpreter (Burmese language). Her husband was  with her and he  understands Burmese better and she speaks  Doyce Loose language better.  She has not been checking her blood glucose to assess  control. Patient has influenza B infection that was confirmed  by nasal swab PCR and takes Tamiflu.  She has mild shortness of breath and feels unwell.  Twin A: Maternal right, BREECH presentation. Amniotic fluid  is normal and good fetal activity is seen. Antenatal testing is  reassuring. NST is reactive. BPP 10/10.  Twin B: Maternal left, transverse lie and head to maternal left.  Amniotic fluid is normal and good fetal activity is seen.  Antenatal testing is reassuring. NST is reactive. BPP 10/10.  I counseled the patient with help of interpreter and  recommended evaluation at the MAU. Patient had detailed  counseling by diabetic educator and her obstetricians and  she is unable to follow the instructions. The benefit of delivery  at 37 weeks' gestation seems to outweigh potential neonatal  complications. ---------------------------------------------------------------------- Recommendations  -Consider inpatient management for ensuring adequate  treatment of influenza and control of diabetes.  -Recommend delivery at 37 weeks. ----------------------------------------------------------------------                  Noralee Space, MD Electronically Signed Final Report   01/21/2019 09:57 am ----------------------------------------------------------------------  [place] Mfm Fetal Bpp W/nonstress  Result Date: 01/21/2019 ----------------------------------------------------------------------  OBSTETRICS REPORT                       (Signed Final 01/21/2019 09:57 am) ---------------------------------------------------------------------- Patient Info  ID #:       [id number]                          D.O.B.:  [birth year] (38 yrs)  Name:       Teresa Clark                      Visit Date: 01/21/2019 08:50 am ---------------------------------------------------------------------- Performed By  Performed By:      Eden Lathe BS      Ref. Address:     [facility]                    RDMS RVT                                                             OB/Gyn Clinic                                                             [address]                                                             [place], [place]                                                             [address]  Attending:        Noralee Space MD        Location:         [facility]  Referred By:      [facility] for                    [facility]                    Healthcare ---------------------------------------------------------------------- Orders   #  Description                          Code         Ordered By   1  [place] MFM FETAL BPP                     99371.6      Michaelene Song   2  [place] MFM FETAL BPP                     96789.3      [facility]      W/NONSTRESS ADD'L Ammie Dalton  ----------------------------------------------------------------------   #  Order #                    Accession #                 Episode #   1  [id number]                  [id number]   2  [id number]                  [id number]                  [id number]  ---------------------------------------------------------------------- Indications   Twin pregnancy, di/di, third trimester         O38.043   Advanced maternal age multigravida 49+,        O1.523   third trimester   Gestational diabetes in pregnancy,  O24.419   unspecified control (poor control)   [redacted] weeks gestation of pregnancy                Z3A.36  ---------------------------------------------------------------------- Vital Signs                                                 Height:        4'11" ---------------------------------------------------------------------- Fetal Evaluation (Fetus A)  Num Of  Fetuses:         2  Fetal Heart Rate(bpm):  144  Cardiac Activity:       Observed  Fetal Lie:              Maternal right side  Presentation:           Breech  Amniotic Fluid  AFI FV:      Within normal limits                              Largest Pocket(cm)                              4.4 ---------------------------------------------------------------------- Biophysical Evaluation (Fetus A)  Amniotic F.V:   Within normal limits       F. Tone:        Observed  F. Movement:    Observed                   N.S.T:          Reactive  F. Breathing:   Observed                   Score:          10/10 ---------------------------------------------------------------------- OB History  Gravidity:    4         Term:   3        Prem:   0        SAB:   0  TOP:          0       Ectopic:  0        Living: 3 ---------------------------------------------------------------------- Gestational Age (Fetus A)  LMP:           37w 3d        Date:  05/04/18                 EDD:   02/08/19  Best:          Stevie Kern36w 3d     Det. ByMarcella Dubs:  Early Ultrasound         EDD:   02/15/19                                      (08/07/18) ---------------------------------------------------------------------- Fetal Evaluation (Fetus B)  Num Of Fetuses:         2  Fetal Heart Rate(bpm):  120  Cardiac Activity:       Observed  Fetal Lie:              Maternal left side  Presentation:           Transverse, head to maternal left  Amniotic Fluid  AFI FV:      Within normal limits                              Largest Pocket(cm)                              4.4 ---------------------------------------------------------------------- Biophysical Evaluation (Fetus B)  Amniotic F.V:   Within normal limits       F. Tone:        Observed  F. Movement:    Observed                   N.S.T:          Reactive  F. Breathing:   Observed                   Score:          10/10 ---------------------------------------------------------------------- Gestational Age (Fetus B)  LMP:           37w  3d        Date:  05/04/18                 EDD:   02/08/19  Best:          Stevie Kern36w 3d     Det. ByMarcella Dubs:  Early Ultrasound         EDD:   02/15/19                                      (08/07/18) ---------------------------------------------------------------------- Impression  Patient with dichorionic-diamniotic twin pregnancy returned  for antenatal testing. We spoke with her with help of  STRATUS interpreter (Burmese language). Her husband was  with her and he understands Burmese better and she speaks  Doyce Looseoe [place] language better.  She has not been checking her blood glucose to assess  control. Patient has influenza B infection that was confirmed  by nasal swab PCR and takes Tamiflu.  She has mild shortness of breath and feels unwell.  Twin A: Maternal right, BREECH presentation. Amniotic fluid  is normal and good fetal activity is seen. Antenatal testing is  reassuring. NST is reactive. BPP 10/10.  Twin B: Maternal left, transverse lie and head to maternal left.  Amniotic fluid is normal and good fetal activity is seen.  Antenatal testing is reassuring. NST is reactive. BPP 10/10.  I counseled the patient with help of interpreter and  recommended evaluation at the MAU. Patient had detailed  counseling by diabetic educator and her obstetricians and  she is unable to follow the instructions. The benefit of delivery  at 37 weeks' gestation seems to outweigh potential neonatal  complications. ---------------------------------------------------------------------- Recommendations  -Consider inpatient management for ensuring adequate  treatment of influenza and control of diabetes.  -Recommend delivery at 37 weeks. ----------------------------------------------------------------------                  Noralee Spaceavi Shankar, MD Electronically Signed Final Report   01/21/2019 09:57 am ----------------------------------------------------------------------  [place] Abdomen Limited Ruq  Result Date: 01/21/2019 CLINICAL DATA:  Elevated liver  function studies. EXAM: ULTRASOUND ABDOMEN LIMITED RIGHT UPPER QUADRANT COMPARISON:  None. FINDINGS: Gallbladder: The gallbladder is somewhat contracted and thick walled, with wall thickness measuring 6.8 mm. Gallbladder is filled with sludge with central echogenic focus measuring 6 mm diameter, possibly  a nonshadowing stone, sludge ball, or polyp. Murphy's sign is negative. Common bile duct: Diameter: 3.3 mm, normal Liver: No focal lesion identified. Within normal limits in parenchymal echogenicity. Portal vein is patent on color Doppler imaging with normal direction of blood flow towards the liver. Incidental note of prominent hydronephrosis of the right kidney, incompletely evaluated. IMPRESSION: 1. Diffuse gallbladder wall thickening with sludge and nonshadowing stone versus polyp centrally. Murphy's sign is negative. Appearance is nonspecific for cholecystitis. No bile duct dilatation. 2. Incidental note of prominent hydronephrosis of the right kidney. Electronically Signed   By: Burman Nieves M.D.   On: 01/21/2019 20:57        Scheduled Meds: . enoxaparin (LOVENOX) injection  40 mg Subcutaneous Q24H  . measles, mumps & rubella vaccine  0.5 mL Subcutaneous Once  . prenatal multivitamin  1 tablet Oral Q1200  . scopolamine  1 patch Transdermal Once  . senna-docusate  2 tablet Oral Q24H  . simethicone  80 mg Oral TID PC  . simethicone  80 mg Oral Q24H  . Tdap  0.5 mL Intramuscular Once   Continuous Infusions: . lactated ringers 125 mL/hr at 01/22/19 0900  . [facility]) adult infusion for PRURITIS    . oxytocin Stopped (01/21/19 2129)     LOS: 1 day    Time spent: 35    Delaine Lame, MD [facility]  If 7PM-7AM, please contact night-coverage [email or web address] Password [email or web address] 01/22/2019, 10:06 AM

## 2019-01-22 NOTE — Consult Note (Addendum)
[facility] Consult: 10:44 AM 01/22/2019  LOS: 1 day    Referring Provider: Dr Clearnce Sorrel  Primary Care Physician:  Willodean Rosenthal, MD Primary Gastroenterologist:  Dr Myrtie Neither    Reason for Consultation:  Elevated LFTs.     HPI: Teresa Clark is a 38 y.o. female.  PMH 2 prior vaginal deliveries.    Seen by GI in 02/2017 for RUQ/epigastric abdominal pain, nausea, diarrhea.     03/2017 EGD: Erosive gastritis.  Normal duodenum.  Biopsies obtained from the duodenum and stomach.  Duodenal biopsies showed no villous blunting or increased intraepithelial lymphocytes.  Gastric biopsies confirmed active chronic gastritis with presence of H. pylori organisms.  03/2017 Colonoscopy into the distal ileum was normal.  Random biopsies with benign pathology. Completed triple Rx for H Pylori in 04/2017.  Has not had post eradication testing.  Not currently on PPI.    Patient seen in the ED for flulike symptoms on 2/2.  Started Tamiflu for positive influenza B test.  She also used ten, 500 mg capsules of Acetaminophen over the course of ~ 5 days (has 14 pills remaining in 24 count container).  Yesterday, 2/4, she presented feeling poorly.  Labs obtained showed elevated LFTs: T bili 6.6, alkaline phosphatase 201, AST/ALT 197/269.   Concern for HELP syndrome vs acute fatty liver of pregnancy vs viral prodrome vs DILI from Tamiflu.  Abdominal ultrasound showed contracted, thick-walled gallbladder with sludge and possible 6 mm nonshadowing stone versus polyp versus sludge ball.  CBD 3.3 mm.  Liver parenchyma normal.  Portal vein flow normal.  Right kidney hydronephrosis.  She underwent C-section on 2/4.  Transferred to [facility] ICU.  Complains of pain in mid lower abdomen.  No nausea.     This morning, LFTs proved.  WBCs climbing.  Slight  increase of creatinine.  PT/INR have been normal. Acute hepatitis panel negative.  APAP level not obtained.    Last previous LFTs were in 01/2017 and were normal.  Past Medical History:  Diagnosis Date  . Gestational diabetes     Past Surgical History:  Procedure Laterality Date  . CESAREAN SECTION N/A 01/21/2019   Procedure: CESAREAN SECTION;  Surgeon: Levie Heritage, DO;  Location: [facility] BIRTHING SUITES;  Service: Obstetrics;  Laterality: N/A;  . NO PAST SURGERIES      Prior to Admission medications   Medication Sig Start Date End Date Taking? Authorizing Provider  ACCU-CHEK FASTCLIX LANCETS MISC Use as directed to check blood glucose four times daily. 01/17/19  Yes Rhett Bannister S, DO  Acetaminophen (TYLENOL PO) Take by mouth.   Yes [provider]  glucose blood (ACCU-CHEK GUIDE) test strip Use as instructed QID 01/14/19  Yes Reva Bores, MD  oseltamivir (TAMIFLU) 75 MG capsule Take 1 capsule (75 mg total) by mouth every 12 (twelve) hours. 01/19/19  Yes Pollina, Canary Brim, MD  Prenat w/o A Vit-FeFum-FePo-FA (FOLIVANE-OB) 130-92.4-1 MG CAPS Take 1 tablet by mouth daily. 01/17/19  Yes Rhett Bannister S, DO    Scheduled Meds: . enoxaparin (LOVENOX) injection  40 mg  Subcutaneous Q24H  . measles, mumps & rubella vaccine  0.5 mL Subcutaneous Once  . prenatal multivitamin  1 tablet Oral Q1200  . scopolamine  1 patch Transdermal Once  . senna-docusate  2 tablet Oral Q24H  . simethicone  80 mg Oral TID PC  . simethicone  80 mg Oral Q24H  . Tdap  0.5 mL Intramuscular Once   Infusions: . lactated ringers 125 mL/hr at 01/22/19 0900  . naLOXone [facility](NARCAN) adult infusion for PRURITIS    . oxytocin Stopped (01/21/19 2129)   PRN Meds: coconut oil, witch hazel-glycerin **AND** dibucaine, diphenhydrAMINE, diphenhydrAMINE **OR** diphenhydrAMINE, ketorolac **OR** ketorolac, menthol-cetylpyridinium, nalbuphine **OR** nalbuphine, nalbuphine **OR** nalbuphine, naloxone **AND** sodium  chloride flush, naLOXone (NARCAN) adult infusion for PRURITIS, ondansetron (ZOFRAN) IV, oxyCODONE, simethicone, zolpidem   Allergies as of 01/21/2019  . (No Known Allergies)    Family History  Family history unknown: Yes    Social History   Socioeconomic History  . Marital status: Married    Spouse name: Not on file  . Number of children: 2  . Years of education: Not on file  . Highest education level: Not on file  Occupational History  . Not on file  Social Needs  . Financial resource strain: Not on file  . Food insecurity:    Worry: Not on file    Inability: Not on file  . Transportation needs:    Medical: Not on file    Non-medical: Not on file  Tobacco Use  . Smoking status: Never Smoker  . Smokeless tobacco: Never Used  Substance and Sexual Activity  . Alcohol use: No  . Drug use: No  . Sexual activity: Never  Lifestyle  . Physical activity:    Days per week: Not on file    Minutes per session: Not on file  . Stress: Not on file  Relationships  . Social connections:    Talks on phone: Not on file    Gets together: Not on file    Attends religious service: Not on file    Active member of club or organization: Not on file    Attends meetings of clubs or organizations: Not on file    Relationship status: Not on file  . Intimate partner violence:    Fear of current or ex partner: Not on file    Emotionally abused: Not on file    Physically abused: Not on file    Forced sexual activity: Not on file  Other Topics Concern  . Not on file  Social History Narrative  . Not on file    REVIEW OF SYSTEMS: Constitutional: Fatigue ENT:  No nose bleeds Pulm: No difficulty breathing or cough. CV:  No palpitations, no LE edema.  No chest pain. GU:  No hematuria, no frequency GI: No dysphagia.  No constipation, no black stools. Heme: No unusual bleeding or bruising. Transfusions: None Neuro:  No headaches, no peripheral tingling or numbness Derm:  No itching, no  rash or sores.  Endocrine:  No sweats or chills.  No polyuria or dysuria Immunization: Viewed her vaccinations.  I see that she had a flu shot in 2018 but nothing recorded in 2019.  She had her Tdap in 11/2018. Travel:  None beyond local counties in last few months.    PHYSICAL EXAM: Vital signs in last 24 hours: Vitals:   01/22/19 0900 01/22/19 1000  BP: 100/78   Pulse: (!) 58 64  Resp: 18   Temp:    SpO2: 97%  100%   Wt Readings from Last 3 Encounters:  01/21/19 55.4 kg  01/21/19 62.5 kg  01/19/19 63.8 kg    General: Looks well.  Because she is Asian, her skin has a somewhat yellow wound but is not clear that she is jaundiced Head: No facial asymmetry or swelling.  No signs of head trauma. Eyes: No scleral icterus.  No conjunctival pallor. Ears: Not hard of hearing. Nose: No congestion or discharge. Mouth: Oropharynx moist, pink, clear.  Tongue midline.  Good dentition. Neck: No JVD, no masses, no thyromegaly. Lungs: Clear bilaterally, slight loose cough.  No dyspnea. Heart: RRR.  No MRG.  S1, S2 present. Abdomen: Soft.  Tender.  Intact hysterectomy scar.  Active bowel sounds.  Not distended..   Rectal: Deferred Musc/Skeltl: No joint redness, swelling, deformity. Extremities: No CCE. Neurologic: Alert.  Oriented x3.  Moves all 4 limbs.  No tremor, no asterixis.  Fluid speech. Skin: Again, not obviously jaundiced. Nodes: No cervical adenopathy. Psych: Calm, pleasant, cooperative.  Normal affect.  Intake/Output from previous day: 02/04 0701 - 02/05 0700 In: 2708.9 [P.O.:240; I.V.:2218.9; IV Piggyback:250] Out: 2177 [Urine:1900; Blood:277] Intake/Output this shift: Total I/O In: 249.8 [I.V.:249.8] Out: -   LAB RESULTS: Recent Labs    01/21/19 1035 01/21/19 1809 01/22/19 0420  WBC 14.5* 13.6* 25.4*  HGB 13.6 10.6* 10.0*  HCT 40.9 33.1* 31.0*  PLT 224 199 180   BMET Lab Results  Component Value Date   NA 137 01/22/2019   NA 138 01/21/2019   NA 136  01/21/2019   K 5.1 01/22/2019   K 4.7 01/21/2019   K 4.2 01/21/2019   CL 110 01/22/2019   CL 113 (H) 01/21/2019   CL 111 01/21/2019   CO2 15 (L) 01/22/2019   CO2 17 (L) 01/21/2019   CO2 17 (L) 01/21/2019   GLUCOSE 101 (H) 01/22/2019   GLUCOSE 96 01/21/2019   GLUCOSE 83 01/21/2019   BUN 6 01/22/2019   BUN 11 01/21/2019   BUN 12 01/21/2019   CREATININE 1.21 (H) 01/22/2019   CREATININE 1.14 (H) 01/21/2019   CREATININE 1.21 (H) 01/21/2019   CALCIUM 8.6 (L) 01/22/2019   CALCIUM 8.0 (L) 01/21/2019   CALCIUM 8.7 (L) 01/21/2019   LFT Recent Labs    01/21/19 1035 01/21/19 1809 01/22/19 0420  PROT 6.8 5.5* 4.9*  ALBUMIN 2.7* 2.5* 2.0*  AST 321* 197* 137*  ALT 449* 269* 202*  ALKPHOS 267* 201* 176*  BILITOT 8.9* 6.6* 5.3*  BILIDIR  --  4.6* 3.2*   PT/INR Lab Results  Component Value Date   INR 0.99 01/21/2019   Hepatitis Panel Recent Labs    01/21/19 1139  HEPBSAG Negative  HCVAB <0.1  HEPAIGM Negative  HEPBIGM Negative    Drugs of Abuse  No results found for: LABOPIA, COCAINSCRNUR, LABBENZ, AMPHETMU, THCU, LABBARB   RADIOLOGY STUDIES: Dg Chest 2 View  Result Date: 01/21/2019 CLINICAL DATA:  Influenza B, cough, congestion EXAM: CHEST - 2 VIEW COMPARISON:  02/15/2017 chest radiograph. FINDINGS: Stable cardiomediastinal silhouette with normal heart size. No pneumothorax. No pleural effusion. Lungs appear clear, with no acute consolidative airspace disease and no pulmonary edema. IMPRESSION: No active cardiopulmonary disease. Electronically Signed   By: Delbert PhenixJason A Poff M.D.   On: 01/21/2019 11:24   Koreas Mfm Fetal Bpp W/nonstress Add'l Gest Koreas Mfm Fetal Bpp W/nonstress Result Date: 01/21/2019  Impression  Patient with dichorionic-diamniotic twin pregnancy returned  for antenatal testing. We spoke with her with help of  [employer] interpreter (  Burmese language). Her husband was  with her and he understands Burmese better and she speaks  Doyce Loose language better.  She has not been  checking her blood glucose to assess  control. Patient has influenza B infection that was confirmed  by nasal swab PCR and takes Tamiflu.  She has mild shortness of breath and feels unwell.  Twin A: Maternal right, BREECH presentation. Amniotic fluid  is normal and good fetal activity is seen. Antenatal testing is  reassuring. NST is reactive. BPP 10/10.  Twin B: Maternal left, transverse lie and head to maternal left.  Amniotic fluid is normal and good fetal activity is seen.  Antenatal testing is reassuring. NST is reactive. BPP 10/10.  I counseled the patient with help of interpreter and  recommended evaluation at the MAU. Patient had detailed  counseling by diabetic educator and her obstetricians and  she is unable to follow the instructions. The benefit of delivery  at 37 weeks' gestation seems to outweigh potential neonatal  complications.  Recommendations  -Consider inpatient management for ensuring adequate  treatment of influenza and control of diabetes.  -Recommend delivery at 37 weeks.                  Noralee Space, MD Electronically   US Abdomen Limited Ruq  Result Date: 01/21/2019 CLINICAL DATA:  Elevated liver function studies. EXAM: ULTRASOUND ABDOMEN LIMITED RIGHT UPPER QUADRANT COMPARISON:  None. FINDINGS: Gallbladder: The gallbladder is somewhat contracted and thick walled, with wall thickness measuring 6.8 mm. Gallbladder is filled with sludge with central echogenic focus measuring 6 mm diameter, possibly a nonshadowing stone, sludge ball, or polyp. Murphy's sign is negative. Common bile duct: Diameter: 3.3 mm, normal Liver: No focal lesion identified. Within normal limits in parenchymal echogenicity. Portal vein is patent on color Doppler imaging with normal direction of blood flow towards the liver. Incidental note of prominent hydronephrosis of the right kidney, incompletely evaluated. IMPRESSION: 1. Diffuse gallbladder wall thickening with sludge and nonshadowing stone versus polyp  centrally. Murphy's sign is negative. Appearance is nonspecific for cholecystitis. No bile duct dilatation. 2. Incidental note of prominent hydronephrosis of the right kidney. Electronically Signed   By: Burman Nieves M.D.   On: 01/21/2019 20:57     IMPRESSION:   *   Acute hepatitis in pt [redacted] weeks pregnant.  ? HELP ? DILI (Tamiflu) ?  Acetaminophen induced injury.? Viral effect in pt with + Influenza B.   Fortunately LFTs improved, coags normal.   *    Twin pregnancy, s/p emergent C section 2/4   *   Gestational diabetes.    *   Hx H Pylori gastritis 2018, completed triple therapy tx.  Has not had stool H Pylori testing to confirm eradication.  No recent PPI.       PLAN:     *    Supportive care.  Diet as tolerated, currently on regular diet. Serial LFTs to confirm continued decline.  No need to retest coags.    *  ? Worthwhile checking Acetaminophen level given it's > 24 hours since she took Acetaminophen and LFTs are improving?     Jennye Moccasin  01/22/2019, 10:44 AM Phone [phone]  I have reviewed the entire case in detail with the above APP and discussed the plan in detail.  Therefore, I agree with the diagnoses recorded above. In addition,  I have personally interviewed and examined the patient and have personally reviewed any abdominal/pelvic CT scan images.  My additional thoughts are as follows:  I agree this significant acute rise in LFTs appears due to drug-induced liver injury, acute illness, pregnancy related disease, or some combination of those.  She is negative for acute hepatitis and positive for influenza.  She is jaundiced but without signs of encephalopathy or toxicity.  INR normal and LFTs now improving. Normal hepatic and portal venous flow, speaking against thrombosis.  Expect she will continue to improve, and we will see her tomorrow.  No need for N-acetylcysteine.  Charlie Pitter III Office:[phone]   Amada Jupiter, MD

## 2019-01-22 NOTE — Lactation Note (Signed)
This note was copied from a baby's chart. Lactation Consultation Note  Patient Name: Teresa Clark Today's Date: 01/22/2019    LC Note:  Received a call from RN, Rachel, in Medical Intensive Care Unit at [place] regarding this patient, Teresa Clark.  Mother delivered twins on 01/21/19 and was transported to [place].  The babies are in [facility] nursery.  Per RN, mother would like to breast feed and has not started pumping.  A pump was brought to her room last night but has not been set up yet.  Spoke with Rachel, RN, and she is going to obtain a DEBP kit from the pediatric unit.  Another RN will assist her in pump set up.  Instructions provided on pumping times, duration, milk collection, milk storage times and transporting EBM back to [facility].  RN will call me back if she is not able to obtain a pump kit.  Burmese interpreter was available at [facility] during this phone conversation.   Maternal Data    Feeding Feeding Type: Bottle Fed - Formula Nipple Type: Slow - flow  LATCH Score                   Interventions    Lactation Tools Discussed/Used     Consult Status      Beth R DelFava 01/22/2019, 2:31 PM    

## 2019-01-22 NOTE — Anesthesia Postprocedure Evaluation (Signed)
Anesthesia Post Note  Patient: Teresa Clark  Procedure(s) Performed: CESAREAN SECTION (N/A )     Patient location during evaluation: ICU Anesthesia Type: Spinal Level of consciousness: awake Pain management: pain level controlled Vital Signs Assessment: post-procedure vital signs reviewed and stable Respiratory status: spontaneous breathing Cardiovascular status: stable Postop Assessment: no headache, no backache, patient able to bend at knees and no apparent nausea or vomiting Anesthetic complications: no    Last Vitals:  Vitals:   01/22/19 0800 01/22/19 0900  BP: 107/85 100/78  Pulse: (!) 55 (!) 58  Resp: 15 18  Temp:    SpO2: 97% 97%    Last Pain:  Vitals:   01/22/19 0800  TempSrc:   PainSc: 0-No pain   Pain Goal:                   Caren Macadam

## 2019-01-22 NOTE — Lactation Note (Addendum)
This note was copied from a baby's chart. Lactation Consultation Note  Patient Name: Teresa Clark Mayon [id number] Date: 01/22/2019   Received call from supervisor 5N, Belva Chimes.. She reports they need help with inittiating pumping with DEBP for a patient.  Brielynn Markes now with 56 hour old twins born at 31 weeks and has not inittiated pumping. Took colostrum containers, DEBP kit, Breastmilk container bottles, [employer], baby labels and grey buckets.  No interpreter available when first arrived.  Parents speak a dialect of burmese.  Had to wait on family friend who speaks english to start mom  pumping.  Mom is an experienced bf mom but has never used a breastpump.  Mom breastfeed two years with each of her last babies with no complications she reports. Initiated pumping with mom.  Instructed on use of the pump and how to clean the pump parts with her and family.. Demo hand expression with mom and mom able to get out drops of colostrum.  Mom got a small amount of breastmilk with pumping.  Brought breastmilk back and gave to RN in nursery.  Mom does not have a refrigerator in her room there.  Belva Chimes, RN, spoke with peds and they said mom could store the breastmilk in their fridge.  Dad to get the milk and bring to womens with him when he comes to see the infants.   Mom is active in [facility].  Discussed contacting [employer] for possible loaner breastpump. Left and reviewed NICU booklet. Urged mom to pump 10 times day.  Urged mom call lactation as needed.  Maternal Data    Feeding Feeding Type: Breast Milk Nipple Type: Slow - flow  LATCH Score                   Interventions    Lactation Tools Discussed/Used     Consult Status      Teresa Clark 01/22/2019, 11:40 PM

## 2019-01-22 NOTE — Progress Notes (Addendum)
Daily Postpartum Note  Admission Date: 01/21/2019 Current Date: 01/22/2019 1:58 PM  Teresa Clark is a 38 y.o. [id number] POD#1 s/p pLTCS @ 36wks for worsening maternal status.   Pregnancy complicated by: Patient Active Problem List   Diagnosis Date Noted  . Hepatic failure ([place]) 01/21/2019  . Influenza B 01/21/2019  . Gestational diabetes 11/28/2018  . Dichorionic diamniotic twin pregnancy, antepartum 08/01/2018  . Pregnancy with uncertain date of last menstrual period in first trimester, antepartum 08/01/2018  . AMA (advanced maternal age) multigravida 35+ 08/01/2018  . Supervision of other normal pregnancy, antepartum 07/31/2018  . Language barrier, cultural differences 02/01/2017    Overnight/24hr events:  none  Subjective:  Meeting all postop and postpartum goals including flatus  Objective:    Current Vital Signs 24h Vital Sign Ranges  T 97.7 F (36.5 C) Temp  Avg: 98.1 F (36.7 C)  Min: 97.4 F (36.3 C)  Max: 99 F (37.2 C)  BP 105/81 BP  Min: 94/68  Max: 122/77  HR (!) 55 Pulse  Avg: 72.3  Min: 55  Max: 112  RR 15 Resp  Avg: 19.4  Min: 15  Max: 26  SaO2 97 % Room Air SpO2  Avg: 97.2 %  Min: 95 %  Max: 100 %       24 Hour I/O Current Shift I/O  Time Ins Outs 02/04 0701 - 02/05 0700 In: 2708.9 [P.O.:240; I.V.:2218.9] Out: 2177 [Urine:1900] 02/05 0701 - 02/05 1900 In: 726.3 [I.V.:726.3] Out: 850 [Urine:850]   Patient Vitals for the past 24 hrs:  BP Temp Temp src Pulse Resp SpO2 Height Weight  01/22/19 1300 105/81 -- -- (!) 55 15 97 % -- --  01/22/19 1200 118/82 -- -- (!) 58 19 98 % -- --  01/22/19 1120 -- 97.7 F (36.5 C) Axillary -- -- -- -- --  01/22/19 1100 -- -- -- (!) 58 16 97 % -- --  01/22/19 1000 -- -- -- 64 -- 100 % -- --  01/22/19 0900 100/78 -- -- (!) 58 18 97 % -- --  01/22/19 0800 107/85 -- -- (!) 55 15 97 % -- --  01/22/19 0700 100/72 98 F (36.7 C) Oral (!) 59 18 95 % -- --  01/22/19 0600 105/74 -- -- (!) 56 19 97 % -- --  01/22/19 0500 102/77 --  -- (!) 59 19 97 % -- --  01/22/19 0403 -- 98.1 F (36.7 C) Oral -- -- -- -- --  01/22/19 0400 109/84 -- -- 68 (!) 22 98 % -- --  01/22/19 0300 106/80 -- -- 63 19 97 % -- --  01/22/19 0200 106/79 -- -- 62 18 97 % -- --  01/22/19 0100 108/83 -- -- 63 (!) 21 97 % -- --  01/22/19 0014 -- 98.3 F (36.8 C) Oral -- -- -- -- --  01/22/19 0000 109/74 -- -- 69 20 97 % -- --  01/21/19 2300 105/77 -- -- 66 (!) 23 98 % -- --  01/21/19 2200 118/75 -- -- 68 (!) 21 96 % -- --  01/21/19 2100 112/76 -- -- 72 (!) 24 98 % -- --  01/21/19 2000 105/69 -- -- 75 (!) 21 96 % -- --  01/21/19 1957 -- 98.4 F (36.9 C) Oral -- -- -- -- --  01/21/19 1926 113/76 -- -- 70 20 98 % 5' (1.524 m) 55.4 kg  01/21/19 1830 107/67 99 F (37.2 C) -- 92 16 97 % -- --  01/21/19 1815  111/72 -- -- 91 (!) 26 96 % -- --  01/21/19 1800 105/76 -- -- 86 (!) 21 96 % -- --  01/21/19 1745 116/67 -- -- (!) 105 20 98 % -- --  01/21/19 1730 94/68 98 F (36.7 C) -- (!) 112 20 99 % -- --  01/21/19 1715 111/86 (!) 97.4 F (36.3 C) -- 94 15 98 % -- --  01/21/19 1515 -- -- -- -- -- 97 % -- --  01/21/19 1500 110/78 -- -- 85 -- 97 % -- --  01/21/19 1445 -- -- -- -- -- 97 % -- --  01/21/19 1430 -- -- -- -- -- 97 % -- --  01/21/19 1415 -- -- -- -- -- 97 % -- --  01/21/19 1400 122/77 -- -- 88 -- 97 % -- --    Physical exam: General: Well nourished, well developed female in no acute distress. Abdomen: +BS, soft, mildly distended, c/d/i dressing Cardiovascular: S1, S2 normal, no murmur, rub or gallop, regular rate and rhythm Respiratory: CTAB Extremities: no clubbing, cyanosis or edema  Medications: Current Facility-Administered Medications  Medication Dose Route Frequency Provider Last Rate Last Dose  . coconut oil  1 application Topical PRN Nicolette Bang, DO      . witch hazel-glycerin (TUCKS) pad 1 application  1 application Topical PRN Nicolette Bang, DO       And  . dibucaine (NUPERCAINAL) 1 % rectal ointment  1 application  1 application Rectal PRN Nicolette Bang, DO      . diphenhydrAMINE (BENADRYL) capsule 25 mg  25 mg Oral Q6H PRN Nicolette Bang, DO      . diphenhydrAMINE (BENADRYL) injection 12.5 mg  12.5 mg Intravenous Q4H PRN Hatchett, Mateo Flow, MD       Or  . diphenhydrAMINE (BENADRYL) capsule 25 mg  25 mg Oral Q4H PRN Hatchett, Mateo Flow, MD      . enoxaparin (LOVENOX) injection 40 mg  40 mg Subcutaneous Q24H Nicolette Bang, DO      . ketorolac (TORADOL) 30 MG/ML injection 30 mg  30 mg Intravenous Q6H PRN Hatchett, Mateo Flow, MD       Or  . ketorolac (TORADOL) 30 MG/ML injection 30 mg  30 mg Intramuscular Q6H PRN Hatchett, Franklin, MD      . lactated ringers infusion   Intravenous Continuous Nicolette Bang, DO 125 mL/hr at 01/22/19 1300    . measles, mumps & rubella vaccine (MMR) injection 0.5 mL  0.5 mL Subcutaneous Once Nicolette Bang, DO      . menthol-cetylpyridinium (CEPACOL) lozenge 3 mg  1 lozenge Oral Q2H PRN Nicolette Bang, DO      . nalbuphine (NUBAIN) injection 5 mg  5 mg Intravenous Q4H PRN Hatchett, Mateo Flow, MD       Or  . nalbuphine (NUBAIN) injection 5 mg  5 mg Subcutaneous Q4H PRN Hatchett, Mateo Flow, MD      . nalbuphine (NUBAIN) injection 5 mg  5 mg Intravenous Once PRN Lyn Hollingshead, MD       Or  . nalbuphine (NUBAIN) injection 5 mg  5 mg Subcutaneous Once PRN Lyn Hollingshead, MD      . naloxone [facility]) injection 0.4 mg  0.4 mg Intravenous PRN Lyn Hollingshead, MD       And  . sodium chloride flush (NS) 0.9 % injection 3 mL  3 mL Intravenous PRN Hatchett, Franklin, MD      . naloxone HCl (NARCAN) 2 mg in dextrose 5 %  250 mL infusion  1-4 mcg/kg/hr Intravenous Continuous PRN Hatchett, Mateo Flow, MD      . ondansetron (ZOFRAN) injection 4 mg  4 mg Intravenous Q8H PRN Hatchett, Franklin, MD      . oxyCODONE (Oxy IR/ROXICODONE) immediate release tablet 5-10 mg  5-10 mg Oral Q4H PRN Nicolette Bang, DO      . prenatal multivitamin tablet 1 tablet  1 tablet Oral Q1200 Nicolette Bang, DO      . scopolamine (TRANSDERM-SCOP) 1 MG/3DAYS 1.5 mg  1 patch Transdermal Once Hatchett, Mateo Flow, MD      . senna-docusate (Senokot-S) tablet 2 tablet  2 tablet Oral Q24H Nicolette Bang, DO   2 tablet at 01/22/19 0019  . simethicone (MYLICON) chewable tablet 80 mg  80 mg Oral TID PC Nicolette Bang, DO   80 mg at 01/22/19 0910  . simethicone (MYLICON) chewable tablet 80 mg  80 mg Oral Q24H Nicolette Bang, DO      . simethicone [facility]) chewable tablet 80 mg  80 mg Oral PRN Nicolette Bang, DO      . Tdap (BOOSTRIX) injection 0.5 mL  0.5 mL Intramuscular Once Nicolette Bang, DO      . zolpidem [facility]) tablet 5 mg  5 mg Oral QHS PRN Nicolette Bang, DO        Labs:  Recent Labs  Lab 01/21/19 1035 01/21/19 1809 01/22/19 0420  WBC 14.5* 13.6* 25.4*  HGB 13.6 10.6* 10.0*  HCT 40.9 33.1* 31.0*  PLT 224 199 180    Recent Labs  Lab 01/21/19 1035 01/21/19 1809 01/22/19 0420  NA 136 138 137  K 4.2 4.7 5.1  CL 111 113* 110  CO2 17* 17* 15*  BUN _0 CREATININE 1.21* 1.14* 1.21*  CALCIUM 8.7* 8.0* 8.6*  PROT 6.8 5.5* 4.9*  BILITOT 8.9* 6.6* 5.3*  ALKPHOS 267* 201* 176*  ALT 449* 269* 202*  AST 321* 197* 137*  GLUCOSE 83 96 101*     Radiology: no new imaging  Assessment & Plan:  Pt improving *Postpartum: routine care. From surgical standpoint, okay to d/c MIVF. Pumping. Recommend lactation consult. Typically c-sections will stay in the hospital 2-3 days post op. Will continue to follow.  *GI:  improving. No e/o severe pre-eclampsia, HELLP, acute fatty liver. Acute hep panel negative. *AKI: stable *PPx: lovenox, OOB ad lib *FEN/GI: regular diet  Durene Romans. MD Attending Center for [facility])

## 2019-01-22 NOTE — Progress Notes (Signed)
Pt arrived to room 5N22 via wheelchair from . Received report from [place], Charity fundraiser. See assessment. Will continue to monitor.

## 2019-01-23 ENCOUNTER — Other Ambulatory Visit: Payer: Self-pay

## 2019-01-23 DIAGNOSIS — R7989 Other specified abnormal findings of blood chemistry: Secondary | ICD-10-CM

## 2019-01-23 DIAGNOSIS — R945 Abnormal results of liver function studies: Secondary | ICD-10-CM

## 2019-01-23 LAB — CBC
HCT: 27.9 % — ABNORMAL LOW (ref 36.0–46.0)
Hemoglobin: 9.2 g/dL — ABNORMAL LOW (ref 12.0–15.0)
MCH: 35.4 pg — ABNORMAL HIGH (ref 26.0–34.0)
MCHC: 33 g/dL (ref 30.0–36.0)
MCV: 107.3 fL — ABNORMAL HIGH (ref 80.0–100.0)
Platelets: 219 10*3/uL (ref 150–400)
RBC: 2.6 MIL/uL — ABNORMAL LOW (ref 3.87–5.11)
RDW: 13.9 % (ref 11.5–15.5)
WBC: 40.1 10*3/uL — ABNORMAL HIGH (ref 4.0–10.5)
nRBC: 9.1 % — ABNORMAL HIGH (ref 0.0–0.2)

## 2019-01-23 LAB — GLUCOSE, CAPILLARY
GLUCOSE-CAPILLARY: 116 mg/dL — AB (ref 70–99)
Glucose-Capillary: 122 mg/dL — ABNORMAL HIGH (ref 70–99)
Glucose-Capillary: 98 mg/dL (ref 70–99)
Glucose-Capillary: 115 mg/dL — ABNORMAL HIGH (ref 70–99)
Glucose-Capillary: 142 mg/dL — ABNORMAL HIGH (ref 70–99)

## 2019-01-23 LAB — COMPREHENSIVE METABOLIC PANEL
ALT: 142 U/L — ABNORMAL HIGH (ref 0–44)
AST: 96 U/L — AB (ref 15–41)
Albumin: 2 g/dL — ABNORMAL LOW (ref 3.5–5.0)
Alkaline Phosphatase: 165 U/L — ABNORMAL HIGH (ref 38–126)
Anion gap: 11 (ref 5–15)
BUN: 15 mg/dL (ref 6–20)
CHLORIDE: 104 mmol/L (ref 98–111)
CO2: 22 mmol/L (ref 22–32)
Calcium: 9.2 mg/dL (ref 8.9–10.3)
Creatinine, Ser: 1.22 mg/dL — ABNORMAL HIGH (ref 0.44–1.00)
GFR calc Af Amer: >60 mL/min (ref >60)
GFR calc non Af Amer: 57 mL/min — ABNORMAL LOW (ref >60)
Glucose, Bld: 111 mg/dL — ABNORMAL HIGH (ref 70–99)
Potassium: 4.4 mmol/L (ref 3.5–5.1)
Sodium: 137 mmol/L (ref 135–145)
Total Bilirubin: 4.1 mg/dL — ABNORMAL HIGH (ref 0.3–1.2)
Total Protein: 5.1 g/dL — ABNORMAL LOW (ref 6.5–8.1)

## 2019-01-23 LAB — CBC WITH DIFFERENTIAL/PLATELET
Abs Immature Granulocytes: 0 10*3/uL (ref 0.00–0.07)
Basophils Absolute: 0 10*3/uL (ref 0.0–0.1)
Basophils Relative: 0 %
Eosinophils Absolute: 0 10*3/uL (ref 0.0–0.5)
Eosinophils Relative: 0 %
HCT: 25 % — ABNORMAL LOW (ref 36.0–46.0)
Hemoglobin: 8 g/dL — ABNORMAL LOW (ref 12.0–15.0)
Lymphocytes Relative: 9 %
Lymphs Abs: 3.5 10*3/uL (ref 0.7–4.0)
MCH: 34.6 pg — ABNORMAL HIGH (ref 26.0–34.0)
MCHC: 32 g/dL (ref 30.0–36.0)
MCV: 108.2 fL — ABNORMAL HIGH (ref 80.0–100.0)
MONO ABS: 3.2 10*3/uL — AB (ref 0.1–1.0)
Monocytes Relative: 8 %
NRBC: 13.4 % — AB (ref 0.0–0.2)
Neutro Abs: 32.7 10*3/uL — ABNORMAL HIGH (ref 1.7–7.7)
Neutrophils Relative %: 83 %
Platelets: 192 10*3/uL (ref 150–400)
RBC: 2.31 MIL/uL — ABNORMAL LOW (ref 3.87–5.11)
RDW: 14.3 % (ref 11.5–15.5)
WBC Morphology: 5
WBC: 39.4 10*3/uL — ABNORMAL HIGH (ref 4.0–10.5)
nRBC: 19 /100 WBC — ABNORMAL HIGH

## 2019-01-23 LAB — HAPTOGLOBIN: Haptoglobin: 67 mg/dL (ref 33–278)

## 2019-01-23 LAB — PROCALCITONIN: Procalcitonin: 5.35 ng/mL

## 2019-01-23 LAB — MAGNESIUM: Magnesium: 2 mg/dL (ref 1.7–2.4)

## 2019-01-23 LAB — LACTIC ACID, PLASMA: Lactic Acid, Venous: 2.9 mmol/L (ref 0.5–1.9)

## 2019-01-23 NOTE — Progress Notes (Addendum)
Daily Rounding Note  01/23/2019, 9:57 AM  LOS: 2 days   SUBJECTIVE:   Chief complaint: Acute elevation of LFTs.    Some lower abdominal pain associated with C-section. BM this morning.  Tolerating regular diet  OBJECTIVE:         Vital signs in last 24 hours:    Temp:  [97.7 F (36.5 C)-98.5 F (36.9 C)] 98 F (36.7 C) (02/06 0830) Pulse Rate:  [55-64] 61 (02/06 0830) Resp:  [14-19] 18 (02/06 0830) BP: (105-119)/(72-84) 119/84 (02/06 0830) SpO2:  [93 %-100 %] 99 % (02/06 0830) Last BM Date: 01/20/19 Filed Weights   01/21/19 1926  Weight: 55.4 kg   General: looks well.  ? Slight icterus Heart: RRR Chest: clear bil.  No SOB or cough Abdomen: soft, active BS, ND.  No bleeding into dressing at c section scar.    Extremities: no CCE Neuro/Psych:  Alert.  Oriented x 3.  No tremor.    Intake/Output from previous day: 02/05 0701 - 02/06 0700 In: 2439 [P.O.:120; I.V.:2319] Out: 1900 [Urine:1900]  Intake/Output this shift: No intake/output data recorded.  Lab Results: Recent Labs    01/22/19 0420 01/22/19 1832 01/23/19 0549  WBC 25.4* 32.9* 40.1*  HGB 10.0* 9.2* 9.2*  HCT 31.0* 29.4* 27.9*  PLT 180 210 219   BMET Recent Labs    01/22/19 0420 01/22/19 1832 01/23/19 0549  NA 137 137 137  K 5.1 4.8 4.4  CL 110 109 104  CO2 15* 20* 22  GLUCOSE 101* 114* 111*  BUN 6 12 15   CREATININE 1.21* 1.28* 1.22*  CALCIUM 8.6* 8.7* 9.2   LFT Recent Labs    01/21/19 1809 01/22/19 0420 01/22/19 1832 01/23/19 0549  PROT 5.5* 4.9* 4.8* 5.1*  ALBUMIN 2.5* 2.0* 1.9* 2.0*  AST 197* 137* 103* 96*  ALT 269* 202* 156* 142*  ALKPHOS 201* 176* 163* 165*  BILITOT 6.6* 5.3* 4.4* 4.1*  BILIDIR 4.6* 3.2*  --   --    PT/INR Recent Labs    01/21/19 1139  LABPROT 13.0  INR 0.99   Hepatitis Panel Recent Labs    01/21/19 1139  HEPBSAG Negative  HCVAB <0.1  HEPAIGM Negative  HEPBIGM Negative     Studies/Results: Dg Chest 2 View  Result Date: 01/21/2019 CLINICAL DATA:  Influenza B, cough, congestion EXAM: CHEST - 2 VIEW COMPARISON:  02/15/2017 chest radiograph. FINDINGS: Stable cardiomediastinal silhouette with normal heart size. No pneumothorax. No pleural effusion. Lungs appear clear, with no acute consolidative airspace disease and no pulmonary edema. IMPRESSION: No active cardiopulmonary disease. Electronically Signed   By: Delbert Phenix M.D.   On: 01/21/2019 11:24   Ct Abdomen Pelvis W Contrast  Result Date: 01/22/2019 CLINICAL DATA:  Acute generalized abdominal pain. EXAM: CT ABDOMEN AND PELVIS WITH CONTRAST TECHNIQUE: Multidetector CT imaging of the abdomen and pelvis was performed using the standard protocol following bolus administration of intravenous contrast. CONTRAST:  ISOVUE-370 IOPAMIDOL (ISOVUE-370) INJECTION 76% COMPARISON:  Ultrasound of January 21, 2019. FINDINGS: Lower chest: Minimal bibasilar subsegmental atelectasis is noted. Hepatobiliary: Minimal cholelithiasis may be present. No gallbladder wall thickening is noted. No biliary dilatation is noted. No definite hepatic abnormality is noted. Pancreas: Unremarkable. No pancreatic ductal dilatation or surrounding inflammatory changes. Spleen: Normal in size without focal abnormality. Adrenals/Urinary Tract: Adrenal glands appear normal. Bilateral nephrolithiasis is noted. Severe right hydronephrosis is noted with proximal right ureteral dilatation, but no obstructing calculus is noted. This most  likely is due to enlarged postpartum uterus. Urinary bladder is unremarkable. No hydronephrosis is noted on the left. Stomach/Bowel: The stomach appears normal. There is no evidence of bowel obstruction or inflammation. The appendix is not visualized, but no inflammation is noted in the right lower quadrant Vascular/Lymphatic: No significant vascular findings are present. No enlarged abdominal or pelvic lymph nodes. Reproductive:  Enlarged postpartum uterus is noted. Soft tissue gas is noted in the soft tissues anteriorly in the pelvis consistent with history of recent cesarean section. Other: No hernia is noted.  No abnormal fluid collection is noted. Musculoskeletal: No acute or significant osseous findings. IMPRESSION: Bilateral nephrolithiasis is noted. Severe right hydronephrosis is noted with proximal right ureteral dilatation, but no obstructing calculus is noted. This most likely is due to external compression from enlarged postpartum uterus. Minimal cholelithiasis is noted without definite evidence of cholecystitis. Soft tissue gas is noted anteriorly in the pelvis consistent with history of recent cesarean section. Minimal bibasilar posterior subsegmental atelectasis. Electronically Signed   By: Lupita Raider, M.D.   On: 01/22/2019 14:18   US Abdomen Limited Ruq  Result Date: 01/21/2019 CLINICAL DATA:  Elevated liver function studies. EXAM: ULTRASOUND ABDOMEN LIMITED RIGHT UPPER QUADRANT COMPARISON:  None. FINDINGS: Gallbladder: The gallbladder is somewhat contracted and thick walled, with wall thickness measuring 6.8 mm. Gallbladder is filled with sludge with central echogenic focus measuring 6 mm diameter, possibly a nonshadowing stone, sludge ball, or polyp. Murphy's sign is negative. Common bile duct: Diameter: 3.3 mm, normal Liver: No focal lesion identified. Within normal limits in parenchymal echogenicity. Portal vein is patent on color Doppler imaging with normal direction of blood flow towards the liver. Incidental note of prominent hydronephrosis of the right kidney, incompletely evaluated. IMPRESSION: 1. Diffuse gallbladder wall thickening with sludge and nonshadowing stone versus polyp centrally. Murphy's sign is negative. Appearance is nonspecific for cholecystitis. No bile duct dilatation. 2. Incidental note of prominent hydronephrosis of the right kidney. Electronically Signed   By: Burman Nieves M.D.   On:  01/21/2019 20:57   Scheduled Meds: . enoxaparin (LOVENOX) injection  40 mg Subcutaneous Q24H  . prenatal multivitamin  1 tablet Oral Q1200  . scopolamine  1 patch Transdermal Once  . senna-docusate  2 tablet Oral Q24H  . simethicone  80 mg Oral TID PC  . simethicone  80 mg Oral Q24H   Continuous Infusions: . lactated ringers 125 mL/hr at 01/23/19 0300  . naLOXone (NARCAN) adult infusion for PRURITIS     PRN Meds:.coconut oil, witch hazel-glycerin **AND** dibucaine, diphenhydrAMINE, diphenhydrAMINE **OR** diphenhydrAMINE, menthol-cetylpyridinium, nalbuphine **OR** nalbuphine, nalbuphine **OR** nalbuphine, naloxone **AND** sodium chloride flush, naLOXone (NARCAN) adult infusion for PRURITIS, ondansetron (ZOFRAN) IV, oxyCODONE, simethicone, zolpidem   ASSESMENT:   *   Acute hepatitis in pt [redacted] weeks pregnant.  ? HELP ? DILI (Tamiflu) ?  Acetaminophen induced injury.? Viral effect in pt with + Influenza B.   Fortunately LFTs continue to improve since noted on 01/21/2019., coags normal.   *    Twin pregnancy, s/p emergent C section 2/4   *   + influenza B, got Tamiflu for 2 days.    *    Macrocytic anemia.  *   Gestational diabetes.    *   Hx H Pylori gastritis 2018, completed triple therapy tx.  Has not had stool H Pylori testing to confirm eradication.  No recent PPI.     PLAN   *   Check B12 and Folate levels.     *  Will sign off.  Call if questions.  No need for GI fup in office.      Jennye MoccasinSarah Gribbin  01/23/2019, 9:57 AM Phone [phone]  I have discussed the case with the PA, and that is the plan I formulated. I personally interviewed and examined the patient.  Multifactorial cause of elevated LFTs, now significantly improved. Patient is recovering from respiratory illness and C-section. No further recommendations at this time.  See us as needed in the office.    Charlie PitterHenry L Danis III Office: [phone]

## 2019-01-23 NOTE — Progress Notes (Signed)
Daily Postpartum Note  Admission Date: 01/21/2019 Current Date: 01/23/2019 1:53 PM  Teresa Clark is a 38 y.o. [id number] POD#2 s/p pLTCS @ 36wks for worsening maternal status.   Pregnancy complicated by: Patient Active Problem List   Diagnosis Date Noted  . Hepatic failure ([facility]) 01/21/2019  . Influenza B 01/21/2019  . Gestational diabetes 11/28/2018  . Dichorionic diamniotic twin pregnancy, antepartum 08/01/2018  . Pregnancy with uncertain date of last menstrual period in first trimester, antepartum 08/01/2018  . AMA (advanced maternal age) multigravida 35+ 08/01/2018  . Supervision of other normal pregnancy, antepartum 07/31/2018  . Language barrier, cultural differences 02/01/2017    Overnight/24hr events:  none  Subjective:  Meeting all postop and postpartum goals including flatus and pt states she's had a BM  Objective:    Current Vital Signs 24h Vital Sign Ranges  T 98 F (36.7 C) Temp  Avg: 98 F (36.7 C)  Min: 97.8 F (36.6 C)  Max: 98.5 F (36.9 C)  BP 126/79 BP  Min: 111/76  Max: 126/79  HR 80 Pulse  Avg: 64.2  Min: 57  Max: 80  RR 16 Resp  Avg: 16.5  Min: 14  Max: 18  SaO2 98 % Room Air SpO2  Avg: 96.8 %  Min: 93 %  Max: 99 %       24 Hour I/O Current Shift I/O  Time Ins Outs 02/05 0701 - 02/06 0700 In: 2439 [P.O.:120; I.V.:2319] Out: 1900 [Urine:1900] No intake/output data recorded.   Patient Vitals for the past 24 hrs:  BP Temp Temp src Pulse Resp SpO2  01/23/19 1245 126/79 - - 80 16 98 %  01/23/19 0830 119/84 98 F (36.7 C) Oral 61 18 99 %  01/23/19 0410 117/83 97.9 F (36.6 C) Oral (!) 57 14 93 %  01/22/19 1958 111/76 98.5 F (36.9 C) Oral 61 18 95 %  01/22/19 1717 114/72 97.8 F (36.6 C) Oral 62 - 99 %  01/22/19 1557 - 97.8 F (36.6 C) Oral - - -    Physical exam: General: Well nourished, well developed female in no acute distress. Abdomen: +BS, soft, mild-moderately distended, c/d/i dressing Cardiovascular: S1, S2 normal, no murmur, rub or  gallop, regular rate and rhythm Respiratory: CTAB Extremities: no clubbing, cyanosis or edema  Medications: Current Facility-Administered Medications  Medication Dose Route Frequency Provider Last Rate Last Dose  . coconut oil  1 application Topical PRN Arvilla MarketWallace, Catherine Lauren, DO      . witch hazel-glycerin (TUCKS) pad 1 application  1 application Topical PRN Arvilla MarketWallace, Catherine Lauren, DO       And  . dibucaine (NUPERCAINAL) 1 % rectal ointment 1 application  1 application Rectal PRN Arvilla MarketWallace, Catherine Lauren, DO      . diphenhydrAMINE (BENADRYL) capsule 25 mg  25 mg Oral Q6H PRN Arvilla MarketWallace, Catherine Lauren, DO      . diphenhydrAMINE (BENADRYL) injection 12.5 mg  12.5 mg Intravenous Q4H PRN Hatchett, Susann GivensFranklin, MD       Or  . diphenhydrAMINE (BENADRYL) capsule 25 mg  25 mg Oral Q4H PRN Hatchett, Susann GivensFranklin, MD      . enoxaparin (LOVENOX) injection 40 mg  40 mg Subcutaneous Q24H Arvilla MarketWallace, Catherine Lauren, DO   40 mg at 01/22/19 1716  . lactated ringers infusion   Intravenous Continuous Arvilla MarketWallace, Catherine Lauren, DO 125 mL/hr at 01/23/19 1107    . menthol-cetylpyridinium (CEPACOL) lozenge 3 mg  1 lozenge Oral Q2H PRN Arvilla MarketWallace, Catherine Lauren, DO      .  nalbuphine (NUBAIN) injection 5 mg  5 mg Intravenous Q4H PRN Hatchett, Susann GivensFranklin, MD       Or  . nalbuphine (NUBAIN) injection 5 mg  5 mg Subcutaneous Q4H PRN Hatchett, Susann GivensFranklin, MD      . nalbuphine (NUBAIN) injection 5 mg  5 mg Intravenous Once PRN Leilani AbleHatchett, Franklin, MD       Or  . nalbuphine (NUBAIN) injection 5 mg  5 mg Subcutaneous Once PRN Leilani AbleHatchett, Franklin, MD      . naloxone [facility](NARCAN) injection 0.4 mg  0.4 mg Intravenous PRN Leilani AbleHatchett, Franklin, MD       And  . sodium chloride flush (NS) 0.9 % injection 3 mL  3 mL Intravenous PRN Hatchett, Susann GivensFranklin, MD      . naloxone HCl (NARCAN) 2 mg in dextrose 5 % 250 mL infusion  1-4 mcg/kg/hr Intravenous Continuous PRN Hatchett, Susann GivensFranklin, MD      . ondansetron (ZOFRAN) injection 4 mg  4 mg  Intravenous Q8H PRN Hatchett, Franklin, MD      . oxyCODONE (Oxy IR/ROXICODONE) immediate release tablet 5-10 mg  5-10 mg Oral Q4H PRN Arvilla MarketWallace, Catherine Lauren, DO   5 mg at 01/23/19 [id number]  . prenatal multivitamin tablet 1 tablet  1 tablet Oral Q1200 Arvilla MarketWallace, Catherine Lauren, DO   1 tablet at 01/23/19 1313  . scopolamine (TRANSDERM-SCOP) 1 MG/3DAYS 1.5 mg  1 patch Transdermal Once Hatchett, Susann GivensFranklin, MD      . senna-docusate (Senokot-S) tablet 2 tablet  2 tablet Oral Q24H Arvilla MarketWallace, Catherine Lauren, DO   2 tablet at 01/23/19 0027  . simethicone (MYLICON) chewable tablet 80 mg  80 mg Oral TID PC Arvilla MarketWallace, Catherine Lauren, DO   80 mg at 01/23/19 1313  . simethicone (MYLICON) chewable tablet 80 mg  80 mg Oral Q24H Arvilla MarketWallace, Catherine Lauren, DO   80 mg at 01/23/19 [id number]  . simethicone (MYLICON) chewable tablet 80 mg  80 mg Oral PRN Arvilla MarketWallace, Catherine Lauren, DO      . zolpidem [facility](AMBIEN) tablet 5 mg  5 mg Oral QHS PRN Arvilla MarketWallace, Catherine Lauren, DO        Labs:  Recent Labs  Lab 01/22/19 [phone] 01/22/19 1832 01/23/19 0549  WBC 25.4* 32.9* 40.1*  HGB 10.0* 9.2* 9.2*  HCT 31.0* 29.4* 27.9*  PLT 180 210 219    Recent Labs  Lab 01/22/19 0420 01/22/19 1832 01/23/19 0549  NA 137 137 137  K 5.1 4.8 4.4  CL 110 109 104  CO2 15* 20* 22  BUN 6 12 15   CREATININE 1.21* 1.28* 1.22*  CALCIUM 8.6* 8.7* 9.2  PROT 4.9* 4.8* 5.1*  BILITOT 5.3* 4.4* 4.1*  ALKPHOS 176* 163* 165*  ALT 202* 156* 142*  AST 137* 103* 96*  GLUCOSE 101* 114* 111*     Radiology: no new imaging  Assessment & Plan:  Pt improving *Postpartum: routine care. From surgical standpoint, okay to d/c MIVF. Breast pumping. seen by lactation consult. Typically c-sections will stay in the hospital 2-3 days post op. Will continue to follow.  *Leukocytosis: pt did receive betamethasone 12mg  IM prior to surgery for fetal lung maturity and is post op so lightly a component of recent flu B, steroid effect and reactive from surgery.  Abdomen is soft, nttp and lungs sound fine. D/w Dr. Catha GosselinMikhail and I don't think bcx and u/s are needed and repeat labs in morning and if stable/downtrending then more reassured it's a benign process.  *GI:  improving. No e/o severe pre-eclampsia, HELLP, acute fatty  liver. Acute hep panel negative. *AKI: stable *PPx: lovenox, OOB ad lib *FEN/GI: regular diet  Cornelia Copa. MD Attending Center for [facility])

## 2019-01-23 NOTE — Progress Notes (Signed)
PROGRESS NOTE    Teresa Clark  WVP:[id number] DOB: 04-24-1981 DOA: 01/21/2019 PCP: Willodean Rosenthal, MD   Brief Narrative:  HPI on 01/21/2019 by Dr. Candelaria Celeste Teresa Clark is a 38 y.o. female [phone] with IUP at [redacted]w[redacted]d sent to [facility]U from MFM due to feeling ill and concerns of her blood sugars. She was recently diagnosed with influenza 2 days ago and was prescribed tamiflu, which she has been taking. Her appetite has decreased significantly and she has been trying to keep up on her fluid intake. She has been breathing fast and coughing up purulent sputum. After discussing the case with Dr Judeth Cornfield, delivery was recommended.   Pregnancy complicated by Di-Di twins, breech position baby A, transverse position of Baby B, GDM.   Pt states she has been having mild contractions, no vaginal bleeding, intact membranes, with normal fetal movement.     Assessment & Plan   Acute hepatitis  -DDX ?HELLP, drug induced (Tamifu/ acetominophen), viral infection -Acute hepatitis panel unremarkable -RUQ ultrasound showing some sludging of the gallbladder, incidental right hydronephrosis -Gastroenterology consulted and appreciated-feels this is multifactorial, currently improving.  Patient is recovering from respiratory illness as well as recent C-section.  No further recommendations at this time.  Patient may follow-up with GI as needed.  Influenza B infection -Diagnosed on 01/19/2019 and was placed on Tamiflu, question whether patient continued completed the course  Elevated procalcitonin with leukocytosis -?  Reactive to recent birth versus steroids given during delivery -Will order CBC with differential -Patient with no upper respiratory symptoms, currently afebrile -UA unremarkable for infection  Hydronephrosis -Incidental, seen on RUQ ultrasound -Creatinine mildly elevated at 1.2 however it seems to be stable during this hospitalization -patient has been receiving IVF  Status post  C-section -OB/GYN consulted and appreciated, currently following  DVT Prophylaxis  Lovenox  Code Status: Full  Family Communication: Husband at bedside  Disposition Plan: Admitted. Continue to monitor for additional day, likely home on 2/7  Consultants Ob/gyn Gastroenterology  Procedures  RUQ [place]  Antibiotics   Anti-infectives (From admission, onward)   Start     Dose/Rate Route Frequency Ordered Stop   01/21/19 1245  ceFAZolin (ANCEF) IVPB 2g/100 mL premix    Note to Pharmacy:  On call to OR   2 g 200 mL/hr over 30 Minutes Intravenous  Once 01/21/19 1235 01/21/19 1616      Subjective:   Katheline Hu seen and examined today.  Translator used.  Some lower abdominal pain associate with C-section.  Patient was having bowel movement this morning.  Able to tolerate her diet without complaints.  No chest pain or shortness of breath, headache or dizziness.  Objective:   Vitals:   01/22/19 1958 01/23/19 0410 01/23/19 0830 01/23/19 1245  BP: 111/76 117/83 119/84 126/79  Pulse: 61 (!) 57 61 80  Resp: 18 14 18 16   Temp: 98.5 F (36.9 C) 97.9 F (36.6 C) 98 F (36.7 C)   TempSrc: Oral Oral Oral   SpO2: 95% 93% 99% 98%  Weight:      Height:        Intake/Output Summary (Last 24 hours) at 01/23/2019 1332 Last data filed at 01/23/2019 0300 Gross per 24 hour  Intake 1712.72 ml  Output 1050 ml  Net 662.72 ml   Filed Weights   01/21/19 1926  Weight: 55.4 kg    Exam  General: Well developed, well nourished, NAD, appears stated age  HEENT: NCAT, mucous membranes moist. Scleral icterus  Neck: Supple  Cardiovascular: S1 S2 auscultated, RRR, no murmur  Respiratory: Clear to auscultation bilaterally with equal chest rise  Abdomen: Soft, nontender, nondistended, + bowel sounds, C-section scar  Extremities: warm dry without cyanosis clubbing or edema  Neuro: AAOx3, nonfocal  Psych: Normal affect and demeanor    Data Reviewed: I have personally reviewed following labs and  imaging studies  CBC: Recent Labs  Lab 01/21/19 1035 01/21/19 1809 01/22/19 0420 01/22/19 1832 01/23/19 0549  WBC 14.5* 13.6* 25.4* 32.9* 40.1*  NEUTROABS 10.0*  --   --   --   --   HGB 13.6 10.6* 10.0* 9.2* 9.2*  HCT 40.9 33.1* 31.0* 29.4* 27.9*  MCV 105.4* 108.2* 108.4* 108.1* 107.3*  PLT 224 199 180 210 219   Basic Metabolic Panel: Recent Labs  Lab 01/21/19 1035 01/21/19 1809 01/22/19 0420 01/22/19 1832 01/23/19 0549  NA 136 138 137 137 137  K 4.2 4.7 5.1 4.8 4.4  CL 111 113* 110 109 104  CO2 17* 17* 15* 20* 22  GLUCOSE 83 96 101* 114* 111*  BUN 12 11 6 12 15   CREATININE 1.21* 1.14* 1.21* 1.28* 1.22*  CALCIUM 8.7* 8.0* 8.6* 8.7* 9.2  MG  --   --  2.1  --  2.0   GFR: Estimated Creatinine Clearance: 49.8 mL/min (A) (by C-G formula based on SCr of 1.22 mg/dL (H)). Liver Function Tests: Recent Labs  Lab 01/21/19 1035 01/21/19 1809 01/22/19 0420 01/22/19 1832 01/23/19 0549  AST 321* 197* 137* 103* 96*  ALT 449* 269* 202* 156* 142*  ALKPHOS 267* 201* 176* 163* 165*  BILITOT 8.9* 6.6* 5.3* 4.4* 4.1*  PROT 6.8 5.5* 4.9* 4.8* 5.1*  ALBUMIN 2.7* 2.5* 2.0* 1.9* 2.0*   No results for input(s): LIPASE, AMYLASE in the last 168 hours. No results for input(s): AMMONIA in the last 168 hours. Coagulation Profile: Recent Labs  Lab 01/21/19 1139  INR 0.99   Cardiac Enzymes: No results for input(s): CKTOTAL, CKMB, CKMBINDEX, TROPONINI in the last 168 hours. BNP (last 3 results) No results for input(s): PROBNP in the last 8760 hours. HbA1C: No results for input(s): HGBA1C in the last 72 hours. CBG: Recent Labs  Lab 01/22/19 1556 01/22/19 2213 01/23/19 0408 01/23/19 0819 01/23/19 1158  GLUCAP 139* 162* 122* 98 115*   Lipid Profile: No results for input(s): CHOL, HDL, LDLCALC, TRIG, CHOLHDL, LDLDIRECT in the last 72 hours. Thyroid Function Tests: No results for input(s): TSH, T4TOTAL, FREET4, T3FREE, THYROIDAB in the last 72 hours. Anemia Panel: No results  for input(s): VITAMINB12, FOLATE, FERRITIN, TIBC, IRON, RETICCTPCT in the last 72 hours. Urine analysis:    Component Value Date/Time   COLORURINE YELLOW 01/21/2019 1124   APPEARANCEUR CLEAR 01/21/2019 1124   LABSPEC <1.005 (L) 01/21/2019 1124   PHURINE 6.5 01/21/2019 1124   GLUCOSEU NEGATIVE 01/21/2019 1124   HGBUR NEGATIVE 01/21/2019 1124   BILIRUBINUR SMALL (A) 01/21/2019 1124   KETONESUR NEGATIVE 01/21/2019 1124   PROTEINUR NEGATIVE 01/21/2019 1124   UROBILINOGEN 0.2 11/08/2016 0838   NITRITE NEGATIVE 01/21/2019 1124   LEUKOCYTESUR TRACE (A) 01/21/2019 1124   Sepsis Labs: @LABRCNTIP (procalcitonin:4,lacticidven:4)  ) Recent Results (from the past 240 hour(s))  Group A Strep by PCR     Status: None   Collection Time: 01/19/19  3:00 AM  Result Value Ref Range Status   Group A Strep by PCR NOT DETECTED NOT DETECTED Final    Comment: Performed at [facility], [address] [place], [place] [address]  MRSA PCR  Screening     Status: None   Collection Time: 01/21/19  7:44 PM  Result Value Ref Range Status   MRSA by PCR NEGATIVE NEGATIVE Final    Comment:        The GeneXpert MRSA Assay (FDA approved for NASAL specimens only), is one component of a comprehensive MRSA colonization surveillance program. It is not intended to diagnose MRSA infection nor to guide or monitor treatment for MRSA infections. Performed at [facility], [address] [place], [place] [address]       Radiology Studies: Ct Abdomen Pelvis W Contrast  Result Date: 01/22/2019 CLINICAL DATA:  Acute generalized abdominal pain. EXAM: CT ABDOMEN AND PELVIS WITH CONTRAST TECHNIQUE: Multidetector CT imaging of the abdomen and pelvis was performed using the standard protocol following bolus administration of intravenous contrast. CONTRAST:  100mL ISOVUE-370 IOPAMIDOL (ISOVUE-370) INJECTION 76% COMPARISON:  Ultrasound of January 21, 2019. FINDINGS: Lower chest: Minimal bibasilar subsegmental  atelectasis is noted. Hepatobiliary: Minimal cholelithiasis may be present. No gallbladder wall thickening is noted. No biliary dilatation is noted. No definite hepatic abnormality is noted. Pancreas: Unremarkable. No pancreatic ductal dilatation or surrounding inflammatory changes. Spleen: Normal in size without focal abnormality. Adrenals/Urinary Tract: Adrenal glands appear normal. Bilateral nephrolithiasis is noted. Severe right hydronephrosis is noted with proximal right ureteral dilatation, but no obstructing calculus is noted. This most likely is due to enlarged postpartum uterus. Urinary bladder is unremarkable. No hydronephrosis is noted on the left. Stomach/Bowel: The stomach appears normal. There is no evidence of bowel obstruction or inflammation. The appendix is not visualized, but no inflammation is noted in the right lower quadrant Vascular/Lymphatic: No significant vascular findings are present. No enlarged abdominal or pelvic lymph nodes. Reproductive: Enlarged postpartum uterus is noted. Soft tissue gas is noted in the soft tissues anteriorly in the pelvis consistent with history of recent cesarean section. Other: No hernia is noted.  No abnormal fluid collection is noted. Musculoskeletal: No acute or significant osseous findings. IMPRESSION: Bilateral nephrolithiasis is noted. Severe right hydronephrosis is noted with proximal right ureteral dilatation, but no obstructing calculus is noted. This most likely is due to external compression from enlarged postpartum uterus. Minimal cholelithiasis is noted without definite evidence of cholecystitis. Soft tissue gas is noted anteriorly in the pelvis consistent with history of recent cesarean section. Minimal bibasilar posterior subsegmental atelectasis. Electronically Signed   By: Lupita RaiderJames  Green Jr, M.D.   On: 01/22/2019 14:18   [place] Abdomen Limited Ruq  Result Date: 01/21/2019 CLINICAL DATA:  Elevated liver function studies. EXAM: ULTRASOUND ABDOMEN  LIMITED RIGHT UPPER QUADRANT COMPARISON:  None. FINDINGS: Gallbladder: The gallbladder is somewhat contracted and thick walled, with wall thickness measuring 6.8 mm. Gallbladder is filled with sludge with central echogenic focus measuring 6 mm diameter, possibly a nonshadowing stone, sludge ball, or polyp. Murphy's sign is negative. Common bile duct: Diameter: 3.3 mm, normal Liver: No focal lesion identified. Within normal limits in parenchymal echogenicity. Portal vein is patent on color Doppler imaging with normal direction of blood flow towards the liver. Incidental note of prominent hydronephrosis of the right kidney, incompletely evaluated. IMPRESSION: 1. Diffuse gallbladder wall thickening with sludge and nonshadowing stone versus polyp centrally. Murphy's sign is negative. Appearance is nonspecific for cholecystitis. No bile duct dilatation. 2. Incidental note of prominent hydronephrosis of the right kidney. Electronically Signed   By: Burman NievesWilliam  Stevens M.D.   On: 01/21/2019 20:57     Scheduled Meds: . enoxaparin (LOVENOX) injection  40 mg Subcutaneous Q24H  .  prenatal multivitamin  1 tablet Oral Q1200  . scopolamine  1 patch Transdermal Once  . senna-docusate  2 tablet Oral Q24H  . simethicone  80 mg Oral TID PC  . simethicone  80 mg Oral Q24H   Continuous Infusions: . lactated ringers 125 mL/hr at 01/23/19 1107  . naLOXone [facility](NARCAN) adult infusion for PRURITIS       LOS: 2 days   Time Spent in minutes   30 minutes  Tiawana Forgy D.O. on 01/23/2019 at 1:32 PM  Between 7am to 7pm - Please see pager noted on [email or web address]  After 7pm go to [email or web address]  And look for the night coverage person covering for me after hours  [facility] Office  [phone]

## 2019-01-23 NOTE — Progress Notes (Signed)
Spoke with Claris Che RN regarding lactation concern.  Pt unable to get milk from breast with pump.  Pt taking PO well with no noted c/o nausea or vomiting.  Husband states "she needs sleep."  This nurse demonstrated hand expression within language barrier and applied warm compresses.  Husband appears to understand. Breast remain soft, not engorged.  [id number]

## 2019-01-23 NOTE — Progress Notes (Signed)
Patient ambulated in room with one standby assist to and from the bathroom.    We did massage lower abdomen around 9 pm and checked pad 30 minutes later, pad was 1/4 soaked.    Patient's pain rate 1 and she received Oxy IR 5 mg PO at 2231.

## 2019-01-23 NOTE — Lactation Note (Signed)
This note was copied from a baby's chart. Lactation Consultation Note  Patient Name: JALYIAH PURYEAR Andringa SJGGE'Z Date: 01/23/2019 Reason for consult: Other (Comment);Follow-up assessment(telephone call -to the RN Alyssa ( [facility] )  caring for mom -see LC note )  This LC was asked by the House coverage to call [facility] and speak to the Charge RN - Rashim .  LC called MICU at 5:05 pm and spoke with the RN - Alyssa caring Ardith Naill ( mother of the Twins in the treatment  Nursery at [facility] RN relayed to this [facility] mom has been pumping and dad came out to the desk and was concerned mom  Has only been getting drops with pumping. LC asked how many times in the last 24 hours has mom pumped with the DEBP / and was told due to a language barrier/dialect / was not sure.  LC recommended when having the interpreter that can speak her dialect is called to have her clarify with this patient when pumping it potentially can be a slow process. Consistency is the key 8-10 x's in 24 hours both breast and adding hand expressing.  The positive is mom is an experienced breast feeding mother and her brain has had that signal before / which will enhance let down. Also encourage mom to make she is drinking plenty of fluids.  Also explain to mom its going to take time since she has been sick.  LC recommended to the RN to check the flanges and size and make sure she isn't using a size to small or to large.  The RN mentioned mom isn't engorged and breast are soft.     Maternal Data Has patient been taught Hand Expression?: (LC encouraged hand express before  pumping to enhance let down )  Feeding Feeding Type: Formula Nipple Type: Slow - flow  LATCH Score                   Interventions    Lactation Tools Discussed/Used Pump Review: Setup, frequency, and cleaning   Consult Status Consult Status: PRN Date: 01/24/19 Follow-up type: Other (comment)(mom on [facility] )    Matilde Sprang Raji Glinski 01/23/2019, 5:46 PM

## 2019-01-24 LAB — CBC WITH DIFFERENTIAL/PLATELET
Abs Immature Granulocytes: 3.3 10*3/uL — ABNORMAL HIGH (ref 0.00–0.07)
Band Neutrophils: 2 %
Basophils Absolute: 0.4 10*3/uL — ABNORMAL HIGH (ref 0.0–0.1)
Basophils Relative: 1 %
Eosinophils Absolute: 0.8 10*3/uL — ABNORMAL HIGH (ref 0.0–0.5)
Eosinophils Relative: 2 %
HCT: 27.6 % — ABNORMAL LOW (ref 36.0–46.0)
Hemoglobin: 8.8 g/dL — ABNORMAL LOW (ref 12.0–15.0)
LYMPHS PCT: 14 %
Lymphs Abs: 5.8 10*3/uL — ABNORMAL HIGH (ref 0.7–4.0)
MCH: 34.5 pg — ABNORMAL HIGH (ref 26.0–34.0)
MCHC: 31.9 g/dL (ref 30.0–36.0)
MCV: 108.2 fL — ABNORMAL HIGH (ref 80.0–100.0)
Metamyelocytes Relative: 3 %
Monocytes Absolute: 2.9 10*3/uL — ABNORMAL HIGH (ref 0.1–1.0)
Monocytes Relative: 7 %
Myelocytes: 5 %
Neutro Abs: 28.2 10*3/uL — ABNORMAL HIGH (ref 1.7–7.7)
Neutrophils Relative %: 66 %
Platelets: 205 10*3/uL (ref 150–400)
RBC: 2.55 MIL/uL — ABNORMAL LOW (ref 3.87–5.11)
RDW: 14.7 % (ref 11.5–15.5)
WBC: 41.5 10*3/uL — ABNORMAL HIGH (ref 4.0–10.5)
nRBC: 19.3 % — ABNORMAL HIGH (ref 0.0–0.2)
nRBC: 25 /100 WBC — ABNORMAL HIGH

## 2019-01-24 LAB — COMPREHENSIVE METABOLIC PANEL
ALT: 117 U/L — ABNORMAL HIGH (ref 0–44)
AST: 97 U/L — ABNORMAL HIGH (ref 15–41)
Albumin: 1.9 g/dL — ABNORMAL LOW (ref 3.5–5.0)
Alkaline Phosphatase: 164 U/L — ABNORMAL HIGH (ref 38–126)
Anion gap: 10 (ref 5–15)
BUN: 15 mg/dL (ref 6–20)
CO2: 22 mmol/L (ref 22–32)
Calcium: 8.4 mg/dL — ABNORMAL LOW (ref 8.9–10.3)
Chloride: 107 mmol/L (ref 98–111)
Creatinine, Ser: 1.19 mg/dL — ABNORMAL HIGH (ref 0.44–1.00)
GFR calc Af Amer: >60 mL/min (ref >60)
GFR calc non Af Amer: 59 mL/min — ABNORMAL LOW (ref >60)
Glucose, Bld: 121 mg/dL — ABNORMAL HIGH (ref 70–99)
Potassium: 3.9 mmol/L (ref 3.5–5.1)
SODIUM: 139 mmol/L (ref 135–145)
Total Bilirubin: 3.4 mg/dL — ABNORMAL HIGH (ref 0.3–1.2)
Total Protein: 4.6 g/dL — ABNORMAL LOW (ref 6.5–8.1)

## 2019-01-24 LAB — GLUCOSE, CAPILLARY
Glucose-Capillary: 78 mg/dL (ref 70–99)
Glucose-Capillary: 106 mg/dL — ABNORMAL HIGH (ref 70–99)
Glucose-Capillary: 69 mg/dL — ABNORMAL LOW (ref 70–99)
Glucose-Capillary: 123 mg/dL — ABNORMAL HIGH (ref 70–99)

## 2019-01-24 LAB — VITAMIN B12: Vitamin B-12: 4062 pg/mL — ABNORMAL HIGH (ref 180–914)

## 2019-01-24 LAB — PROCALCITONIN: Procalcitonin: 2.76 ng/mL

## 2019-01-24 MED ORDER — OXYCODONE HCL 5 MG PO TABS: 5.0000 mg | ORAL_TABLET | Freq: Four times a day (QID) | ORAL | 0 refills | Status: DC | PRN

## 2019-01-24 MED ORDER — GUAIFENESIN-DM 100-10 MG/5ML PO SYRP
5.0000 mL | ORAL_SOLUTION | ORAL | Status: DC | PRN
  Filled 2019-01-24: qty 5

## 2019-01-24 MED ORDER — BENZONATATE 100 MG PO CAPS
100.0000 mg | ORAL_CAPSULE | Freq: Two times a day (BID) | ORAL | Status: DC | PRN
  Administered 2019-01-24: 100 mg via ORAL
  Filled 2019-01-24: qty 1

## 2019-01-24 NOTE — Progress Notes (Signed)
RN gave pt discharge instructions, pt family member at bedside translated and asked questions on behalf of the patient if she did not understand. Pt IV has been removed, she has 1 prescription for pain that I gave to her. Pt getting dressed, family packed up all of her belongings. Awaiting wheelchair to DC

## 2019-01-24 NOTE — Progress Notes (Signed)
Daily Postpartum Note  Admission Date: 01/21/2019 Current Date: 01/24/2019 11:52 AM  Teresa Clark is a 38 y.o. [id number] POD#3 s/p pLTCS @ 36wks for worsening maternal status.   Pregnancy complicated by: Patient Active Problem List   Diagnosis Date Noted  . Elevated LFTs   . Hepatic failure (HCC) 01/21/2019  . Influenza B 01/21/2019  . Gestational diabetes 11/28/2018  . Dichorionic diamniotic twin pregnancy, antepartum 08/01/2018  . Pregnancy with uncertain date of last menstrual period in first trimester, antepartum 08/01/2018  . AMA (advanced maternal age) multigravida 35+ 08/01/2018  . Supervision of other normal pregnancy, antepartum 07/31/2018  . Language barrier, cultural differences 02/01/2017    Overnight/24hr events:  none  Subjective:  Meeting all postop and postpartum goals including flatus and pt states she's had a BM  Objective:    Current Vital Signs 24h Vital Sign Ranges  T 98 F (36.7 C) Temp  Avg: 98.4 F (36.9 C)  Min: 98 F (36.7 C)  Max: 98.8 F (37.1 C)  BP 112/76 BP  Min: 108/75  Max: 126/79  HR 67 Pulse  Avg: 69  Min: 63  Max: 80  RR 16 Resp  Avg: 16  Min: 16  Max: 16  SaO2 100 % Room Air SpO2  Avg: 97.8 %  Min: 94 %  Max: 100 %       24 Hour I/O Current Shift I/O  Time Ins Outs No intake/output data recorded. 02/07 0701 - 02/07 1900 In: 120 [P.O.:120] Out: -    Patient Vitals for the past 24 hrs:  BP Temp Temp src Pulse Resp SpO2  01/24/19 1133 112/76 98 F (36.7 C) Oral 67 16 100 %  01/24/19 0341 121/66 98.1 F (36.7 C) Oral 66 16 97 %  01/23/19 1929 (!) 114/56 98.3 F (36.8 C) Oral 69 16 100 %  01/23/19 1452 108/75 98.8 F (37.1 C) Oral 63 16 94 %  01/23/19 1245 126/79 98.8 F (37.1 C) Oral 80 16 98 %    Physical exam: General: Well nourished, well developed female in no acute distress. Abdomen: +BS, soft, mild-moderately distended, c/d/i dressing. Removed and c/d/i incision with steri strips in place Cardiovascular: S1, S2 normal, no  murmur, rub or gallop, regular rate and rhythm Respiratory: CTAB Extremities: no clubbing, cyanosis or edema  Medications: Current Facility-Administered Medications  Medication Dose Route Frequency Provider Last Rate Last Dose  . benzonatate (TESSALON) capsule 100 mg  100 mg Oral BID PRN Edsel PetrinMikhail, Maryann, DO   100 mg at 01/24/19 1128  . coconut oil  1 application Topical PRN Arvilla MarketWallace, Catherine Lauren, DO      . witch hazel-glycerin (TUCKS) pad 1 application  1 application Topical PRN Arvilla MarketWallace, Catherine Lauren, DO       And  . dibucaine (NUPERCAINAL) 1 % rectal ointment 1 application  1 application Rectal PRN Arvilla MarketWallace, Catherine Lauren, DO      . diphenhydrAMINE (BENADRYL) capsule 25 mg  25 mg Oral Q6H PRN Arvilla MarketWallace, Catherine Lauren, DO      . diphenhydrAMINE (BENADRYL) injection 12.5 mg  12.5 mg Intravenous Q4H PRN Hatchett, Susann GivensFranklin, MD       Or  . diphenhydrAMINE (BENADRYL) capsule 25 mg  25 mg Oral Q4H PRN Hatchett, Susann GivensFranklin, MD      . enoxaparin (LOVENOX) injection 40 mg  40 mg Subcutaneous Q24H Arvilla MarketWallace, Catherine Lauren, DO   40 mg at 01/23/19 1730  . guaiFENesin-dextromethorphan (ROBITUSSIN DM) 100-10 MG/5ML syrup 5 mL  5 mL Oral  Q4H PRN Edsel PetrinMikhail, Maryann, DO      . lactated ringers infusion   Intravenous Continuous Arvilla MarketWallace, Catherine Lauren, DO 125 mL/hr at 01/23/19 1107    . menthol-cetylpyridinium (CEPACOL) lozenge 3 mg  1 lozenge Oral Q2H PRN Arvilla MarketWallace, Catherine Lauren, DO      . nalbuphine (NUBAIN) injection 5 mg  5 mg Intravenous Q4H PRN Leilani AbleHatchett, Franklin, MD       Or  . nalbuphine (NUBAIN) injection 5 mg  5 mg Subcutaneous Q4H PRN Hatchett, Susann GivensFranklin, MD      . nalbuphine (NUBAIN) injection 5 mg  5 mg Intravenous Once PRN Leilani AbleHatchett, Franklin, MD       Or  . nalbuphine (NUBAIN) injection 5 mg  5 mg Subcutaneous Once PRN Leilani AbleHatchett, Franklin, MD      . naloxone [facility](NARCAN) injection 0.4 mg  0.4 mg Intravenous PRN Leilani AbleHatchett, Franklin, MD       And  . sodium chloride flush (NS) 0.9 %  injection 3 mL  3 mL Intravenous PRN Hatchett, Susann GivensFranklin, MD      . naloxone HCl (NARCAN) 2 mg in dextrose 5 % 250 mL infusion  1-4 mcg/kg/hr Intravenous Continuous PRN Hatchett, Susann GivensFranklin, MD      . ondansetron (ZOFRAN) injection 4 mg  4 mg Intravenous Q8H PRN Hatchett, Franklin, MD      . oxyCODONE (Oxy IR/ROXICODONE) immediate release tablet 5-10 mg  5-10 mg Oral Q4H PRN Arvilla MarketWallace, Catherine Lauren, DO   5 mg at 01/23/19 [id number]  . prenatal multivitamin tablet 1 tablet  1 tablet Oral Q1200 Arvilla MarketWallace, Catherine Lauren, DO   1 tablet at 01/24/19 1128  . scopolamine (TRANSDERM-SCOP) 1 MG/3DAYS 1.5 mg  1 patch Transdermal Once Hatchett, Franklin, MD      . senna-docusate (Senokot-S) tablet 2 tablet  2 tablet Oral Q24H Arvilla MarketWallace, Catherine Lauren, DO   2 tablet at 01/24/19 [id number]  . simethicone (MYLICON) chewable tablet 80 mg  80 mg Oral TID PC Arvilla MarketWallace, Catherine Lauren, DO   80 mg at 01/24/19 0851  . simethicone (MYLICON) chewable tablet 80 mg  80 mg Oral Q24H Arvilla MarketWallace, Catherine Lauren, DO   80 mg at 01/24/19 [id number]  . simethicone (MYLICON) chewable tablet 80 mg  80 mg Oral PRN Arvilla MarketWallace, Catherine Lauren, DO      . zolpidem [facility](AMBIEN) tablet 5 mg  5 mg Oral QHS PRN Arvilla MarketWallace, Catherine Lauren, DO        Labs:  Recent Labs  Lab 01/23/19 0549 01/23/19 1502 01/24/19 1029  WBC 40.1* 39.4* 41.5*  HGB 9.2* 8.0* 8.8*  HCT 27.9* 25.0* 27.6*  PLT 219 192 205    Recent Labs  Lab 01/22/19 1832 01/23/19 0549 01/24/19 0223  NA 137 137 139  K 4.8 4.4 3.9  CL 109 104 107  CO2 20* 22 22  BUN 12 15 15   CREATININE 1.28* 1.22* 1.19*  CALCIUM 8.7* 9.2 8.4*  PROT 4.8* 5.1* 4.6*  BILITOT 4.4* 4.1* 3.4*  ALKPHOS 163* 165* 164*  ALT 156* 142* 117*  AST 103* 96* 97*  GLUCOSE 114* 111* 121*     Radiology: no new imaging  Assessment & Plan:  Pt improving *Postpartum: Conflict (See Lab Report): O POS/O POS Performed at [facility], [address] [place], [place] [address] pt doing well. Breast feeding.  Okay to home from OB POV.  *Leukocytosis: no e/o infection and pt doing well. Defer to IM team  *GI:  improving. No e/o severe pre-eclampsia, HELLP, acute fatty liver. Acute  hep panel negative. *AKI: stable *PPx: lovenox, OOB ad lib *FEN/GI: regular diet  Cornelia Copa. MD Attending Center for [facility])

## 2019-01-24 NOTE — Discharge Summary (Signed)
Physician Discharge Summary  Teresa Clark ZOX:[id number] DOB: 1981/01/03 DOA: 01/21/2019  PCP: Willodean Rosenthal, MD  Admit date: 01/21/2019 Discharge date: 01/24/2019  Time spent: 45 minutes  Recommendations for Outpatient Follow-up:  Patient will be discharged to home.  Patient will need to follow up with primary care provider within one week of discharge, repeat CBC, CMP.  Follow up with Dr. Vergie Living, obstetrician.  Patient should continue medications as prescribed.  Patient should follow a regular diet.   Discharge Diagnoses:  Principal Problem: Acute hepatitis  Influenza B infection Elevated procalcitonin with leukocytosis Hydronephrosis Status post C-section  Discharge Condition: Stable  Diet recommendation: regular  Filed Weights   01/21/19 1926  Weight: 55.4 kg    History of present illness:  on 01/21/2019 by Dr. Candelaria Celeste Teresa Clark a 38 y.o.femaleG4P3002 with IUP at [redacted]w[redacted]d sent to [facility]U from MFM due to feeling ill and concerns of her blood sugars. She was recently diagnosed with influenza 2 days ago and was prescribed tamiflu, which she has been taking. Her appetite has decreased significantly and she has been trying to keep up on her fluid intake. She has been breathing fast and coughing up purulent sputum. After discussing the case with Dr Judeth Cornfield, delivery was recommended.   Pregnancy complicated by Di-Di twins, breech position baby A, transverse position of Baby B, GDM.  Pt states she has been havingmildcontractions, novaginal bleeding, intact membranes, with normal fetal movement.   Hospital Course:  Acute hepatitis  -DDX ?HELLP, drug induced (Tamifu/ acetominophen), viral infection -Acute hepatitis panel unremarkable -RUQ ultrasound showing some sludging of the gallbladder, incidental right hydronephrosis -Gastroenterology consulted and appreciated-feels this is multifactorial, currently improving.  Patient is recovering from respiratory illness as  well as recent C-section.  No further recommendations at this time.  Patient may follow-up with GI as needed.  Influenza B infection -Diagnosed on 01/19/2019 and was placed on Tamiflu, question whether patient continued completed the course  Elevated procalcitonin with leukocytosis -?  Reactive to recent birth versus steroids given during delivery -Procalcitonin trending downward without antibiotics  -Patient with no upper respiratory symptoms, currently afebrile -UA unremarkable for infection -blood cultures show no growth to date -Discussed leukocytosis with Dr. Vergie Living, can be seen with steroid use -Discussed with hematology, reviewed case and CBC with diff- likely due to steroids- would have have repeat CBC in a week  Hydronephrosis -Incidental, seen on RUQ ultrasound -Creatinine mildly elevated at 1.2 however it seems to be stable during this hospitalization -patient has been receiving IVF  Status post C-section -OB/GYN consulted and appreciated, currently following  Procedures: RUQ [place]  Consultations: Gastroenterology OB/Gyn Hematology, via phone, Dr. Myna Hidalgo  Discharge Exam: Vitals:   01/24/19 0341 01/24/19 1133  BP: 121/66 112/76  Pulse: 66 67  Resp: 16 16  Temp: 98.1 F (36.7 C) 98 F (36.7 C)  SpO2: 97% 100%   Planes of cough, and has abdominal pain with coughing.  Denies chest pain, shortness of breath, nausea vomiting, diarrhea or constipation, dizziness or headache.   General: Well developed, well nourished, NAD, appears stated age  HEENT: NCAT, mucous membranes moist.  Neck: Supple  Cardiovascular: S1 S2 auscultated,RRR, no murmur  Respiratory: Clear to auscultation bilaterally with equal chest rise  Abdomen: Soft, nontender, nondistended, + bowel sounds, C-section incision  Extremities: warm dry without cyanosis clubbing or edema  Neuro: AAOx3, nonfocal  Psych: Normal affect and demeanor with intact judgement and insight  Discharge  Instructions Discharge Instructions    Discharge instructions  Complete by:  As directed    Patient will be discharged to home.  Patient will need to follow up with primary care provider within one week of discharge, repeat CBC, CMP.  Follow up with Dr. Vergie Living, obstetrician.  Patient should continue medications as prescribed.  Patient should follow a regular diet.     Allergies as of 01/24/2019   No Known Allergies     Medication List    STOP taking these medications   ACCU-CHEK FASTCLIX LANCETS Misc   oseltamivir 75 MG capsule Commonly known as:  TAMIFLU   TYLENOL PO     TAKE these medications   FOLIVANE-OB 130-92.4-1 MG Caps Take 1 tablet by mouth daily.   glucose blood test strip Commonly known as:  ACCU-CHEK GUIDE Use as instructed QID   oxyCODONE 5 MG immediate release tablet Commonly known as:  Oxy IR/ROXICODONE Take 1-2 tablets (5-10 mg total) by mouth every 6 (six) hours as needed for moderate pain.      No Known Allergies Follow-up Information    Willodean Rosenthal, MD. Schedule an appointment as soon as possible for a visit in 1 week(s).   Specialty:  Obstetrics and Gynecology Why:  Hospital follow up Contact information: [address] [place] [address] [phone]            The results of significant diagnostics from this hospitalization (including imaging, microbiology, ancillary and laboratory) are listed below for reference.    Significant Diagnostic Studies: Dg Chest 2 View  Result Date: 01/21/2019 CLINICAL DATA:  Influenza B, cough, congestion EXAM: CHEST - 2 VIEW COMPARISON:  02/15/2017 chest radiograph. FINDINGS: Stable cardiomediastinal silhouette with normal heart size. No pneumothorax. No pleural effusion. Lungs appear clear, with no acute consolidative airspace disease and no pulmonary edema. IMPRESSION: No active cardiopulmonary disease. Electronically Signed   By: Delbert Phenix M.D.   On: 01/21/2019 11:24   Ct Abdomen  Pelvis W Contrast  Result Date: 01/22/2019 CLINICAL DATA:  Acute generalized abdominal pain. EXAM: CT ABDOMEN AND PELVIS WITH CONTRAST TECHNIQUE: Multidetector CT imaging of the abdomen and pelvis was performed using the standard protocol following bolus administration of intravenous contrast. CONTRAST:  ISOVUE-370 IOPAMIDOL (ISOVUE-370) INJECTION 76% COMPARISON:  Ultrasound of January 21, 2019. FINDINGS: Lower chest: Minimal bibasilar subsegmental atelectasis is noted. Hepatobiliary: Minimal cholelithiasis may be present. No gallbladder wall thickening is noted. No biliary dilatation is noted. No definite hepatic abnormality is noted. Pancreas: Unremarkable. No pancreatic ductal dilatation or surrounding inflammatory changes. Spleen: Normal in size without focal abnormality. Adrenals/Urinary Tract: Adrenal glands appear normal. Bilateral nephrolithiasis is noted. Severe right hydronephrosis is noted with proximal right ureteral dilatation, but no obstructing calculus is noted. This most likely is due to enlarged postpartum uterus. Urinary bladder is unremarkable. No hydronephrosis is noted on the left. Stomach/Bowel: The stomach appears normal. There is no evidence of bowel obstruction or inflammation. The appendix is not visualized, but no inflammation is noted in the right lower quadrant Vascular/Lymphatic: No significant vascular findings are present. No enlarged abdominal or pelvic lymph nodes. Reproductive: Enlarged postpartum uterus is noted. Soft tissue gas is noted in the soft tissues anteriorly in the pelvis consistent with history of recent cesarean section. Other: No hernia is noted.  No abnormal fluid collection is noted. Musculoskeletal: No acute or significant osseous findings. IMPRESSION: Bilateral nephrolithiasis is noted. Severe right hydronephrosis is noted with proximal right ureteral dilatation, but no obstructing calculus is noted. This most likely is due to external compression from  enlarged postpartum uterus. Minimal cholelithiasis is noted without definite evidence of cholecystitis. Soft tissue gas is noted anteriorly in the pelvis consistent with history of recent cesarean section. Minimal bibasilar posterior subsegmental atelectasis. Electronically Signed   By: Lupita Raider, M.D.   On: 01/22/2019 14:18   [place] Mfm Fetal Bpp W/nonstress Add'l Gest  Result Date: 01/21/2019 ----------------------------------------------------------------------  OBSTETRICS REPORT                       (Signed Final 01/21/2019 09:57 am) ---------------------------------------------------------------------- Patient Info  ID #:       [id number]                          D.O.B.:  [birth year] (38 yrs)  Name:       Teresa Clark                      Visit Date: 01/21/2019 08:50 am ---------------------------------------------------------------------- Performed By  Performed By:     Eden Lathe BS      Ref. Address:     [facility]                    RDMS RVT                                                             OB/Gyn Clinic                                                             [address]                                                             [place], [place]                                                             [address]  Attending:        Noralee Space MD        Location:         [facility]  Referred By:      [facility]                    Healthcare ---------------------------------------------------------------------- Orders   #  Description                          Code         Ordered By   1  [place] MFM FETAL BPP                     14782.9      Michaelene Song   2  [place] MFM FETAL BPP                     56213.0      [facility]      W/NONSTRESS ADD'L Ammie Dalton   ----------------------------------------------------------------------   #  Order #                    Accession #                 Episode #   1  [id number]                  [id number]                  [id number]   2  [id number]                  [id number]                  [id number]  ---------------------------------------------------------------------- Indications   Twin pregnancy, di/di, third trimester         O2.043   Advanced maternal age multigravida 99+,        O33.523   third trimester   Gestational diabetes in pregnancy,             O24.419   unspecified control (poor control)   [redacted] weeks gestation of pregnancy                Z3A.36  ---------------------------------------------------------------------- Vital Signs                                                 Height:        4'11" ---------------------------------------------------------------------- Fetal Evaluation (Fetus A)  Num Of Fetuses:         2  Fetal Heart Rate(bpm):  144  Cardiac Activity:       Observed  Fetal Lie:              Maternal right side  Presentation:           Breech  Amniotic Fluid  AFI FV:      Within normal limits  Largest Pocket(cm)                              4.4 ---------------------------------------------------------------------- Biophysical Evaluation (Fetus A)  Amniotic F.V:   Within normal limits       F. Tone:        Observed  F. Movement:    Observed                   N.S.T:          Reactive  F. Breathing:   Observed                   Score:          10/10 ---------------------------------------------------------------------- OB History  Gravidity:    4         Term:   3        Prem:   0        SAB:   0  TOP:          0       Ectopic:  0        Living: 3 ---------------------------------------------------------------------- Gestational Age (Fetus A)  LMP:           37w 3d        Date:  05/04/18                 EDD:   02/08/19  Best:          Stevie Kern 3d     Det. ByMarcella Dubs         EDD:    02/15/19                                      (08/07/18) ---------------------------------------------------------------------- Fetal Evaluation (Fetus B)  Num Of Fetuses:         2  Fetal Heart Rate(bpm):  120  Cardiac Activity:       Observed  Fetal Lie:              Maternal left side  Presentation:           Transverse, head to maternal left  Amniotic Fluid  AFI FV:      Within normal limits                              Largest Pocket(cm)                              4.4 ---------------------------------------------------------------------- Biophysical Evaluation (Fetus B)  Amniotic F.V:   Within normal limits       F. Tone:        Observed  F. Movement:    Observed                   N.S.T:          Reactive  F. Breathing:   Observed                   Score:          10/10 ---------------------------------------------------------------------- Gestational Age (Fetus B)  LMP:           37w 3d        Date:  05/04/18                 EDD:   02/08/19  Best:          Stevie Kern 3d     Det. ByMarcella Dubs         EDD:   02/15/19                                      (08/07/18) ---------------------------------------------------------------------- Impression  Patient with dichorionic-diamniotic twin pregnancy returned  for antenatal testing. We spoke with her with help of  STRATUS interpreter (Burmese language). Her husband was  with her and he understands Burmese better and she speaks  Doyce Loose language better.  She has not been checking her blood glucose to assess  control. Patient has influenza B infection that was confirmed  by nasal swab PCR and takes Tamiflu.  She has mild shortness of breath and feels unwell.  Twin A: Maternal right, BREECH presentation. Amniotic fluid  is normal and good fetal activity is seen. Antenatal testing is  reassuring. NST is reactive. BPP 10/10.  Twin B: Maternal left, transverse lie and head to maternal left.  Amniotic fluid is normal and good fetal activity is seen.  Antenatal  testing is reassuring. NST is reactive. BPP 10/10.  I counseled the patient with help of interpreter and  recommended evaluation at the MAU. Patient had detailed  counseling by diabetic educator and her obstetricians and  she is unable to follow the instructions. The benefit of delivery  at 37 weeks' gestation seems to outweigh potential neonatal  complications. ---------------------------------------------------------------------- Recommendations  -Consider inpatient management for ensuring adequate  treatment of influenza and control of diabetes.  -Recommend delivery at 37 weeks. ----------------------------------------------------------------------                  Noralee Space, MD Electronically Signed Final Report   01/21/2019 09:57 am ----------------------------------------------------------------------  [place] Mfm Fetal Bpp W/nonstress Add'l Gest  Result Date: 01/14/2019 ----------------------------------------------------------------------  OBSTETRICS REPORT                       (Signed Final 01/14/2019 10:16 am) ---------------------------------------------------------------------- Patient Info  ID #:       [id number]                          D.O.B.:  10-16-1981 (38 yrs)  Name:       Teresa Clark                      Visit Date: 01/14/2019 09:14 am ---------------------------------------------------------------------- Performed By  Performed By:     Lenise Arena        Ref. Address:     [facility]                    RDMS                                                             OB/Gyn Clinic  [address]                                                             [place], [place]                                                             [address]  Attending:        Noralee Space MD        Location:         [facility]  Referred By:      [facility] for                    [facility]                    Healthcare ---------------------------------------------------------------------- Orders   #  Description                          Code         Ordered By   1  [place] MFM FETAL BPP                     19147.8      Michaelene Song   2  [place] MFM FETAL BPP                     29562.1      [facility]      W/NONSTRESS ADD'L Ammie Dalton  ----------------------------------------------------------------------   #  Order #                    Accession #                 Episode #   1  [id number]                  [id number]   2  [id number]                  [id number]  [id number]  ---------------------------------------------------------------------- Indications   Advanced maternal age multigravida 87+,        O36.523   third trimester   Gestational diabetes in pregnancy,             O24.419   unspecified control (poor control)   Twin pregnancy, di/di, third trimester         O30.043   [redacted] weeks gestation of pregnancy                Z3A.35  ---------------------------------------------------------------------- Vital Signs  Weight (lb): 142                               Height:        4'11"  BMI:         28.68 ---------------------------------------------------------------------- Fetal Evaluation (Fetus A)  Num Of Fetuses:         2  Fetal Heart Rate(bpm):  144  Cardiac Activity:       Observed  Fetal Lie:              Maternal right side  Presentation:           Breech  Placenta:               Anterior  P. Cord Insertion:      Previously Visualized  Membrane Desc:      Dividing Membrane seen - Dichorionic.  Amniotic Fluid  AFI FV:      Within normal limits                              Largest Pocket(cm)                              4.7 ---------------------------------------------------------------------- Biophysical Evaluation (Fetus A)  Amniotic F.V:    Within normal limits       F. Tone:        Observed  F. Movement:    Observed                   N.S.T:          Reactive  F. Breathing:   Observed                   Score:          10/10 ---------------------------------------------------------------------- OB History  Gravidity:    4         Term:   3        Prem:   0        SAB:   0  TOP:          0       Ectopic:  0        Living: 3 ---------------------------------------------------------------------- Gestational Age (Fetus A)  LMP:           36w 3d        Date:  05/04/18                 EDD:   02/08/19  Best:          Consuello Closs 3d     Det. ByMarcella Dubs         EDD:   02/15/19                                      (  08/07/18) ---------------------------------------------------------------------- Anatomy (Fetus A)  Thoracic:              Appears normal         Kidneys:                Appear normal  Stomach:               Appears normal, left   Bladder:                Appears normal                         sided  Abdomen:               Appears normal ---------------------------------------------------------------------- Fetal Evaluation (Fetus B)  Num Of Fetuses:         2  Fetal Heart Rate(bpm):  144  Cardiac Activity:       Observed  Fetal Lie:              Maternal left side  Presentation:           Cephalic  Placenta:               Left lateral  P. Cord Insertion:      Previously Visualized  Membrane Desc:      Dividing Membrane seen - Dichorionic.  Amniotic Fluid  AFI FV:      Within normal limits                              Largest Pocket(cm)                              5.84 ---------------------------------------------------------------------- Biophysical Evaluation (Fetus B)  Amniotic F.V:   Within normal limits       F. Tone:        Observed  F. Movement:    Observed                   N.S.T:          Reactive  F. Breathing:   Observed                   Score:          10/10 ----------------------------------------------------------------------  Gestational Age (Fetus B)  LMP:           36w 3d        Date:  05/04/18                 EDD:   02/08/19  Best:          Consuello Closs 3d     Det. ByMarcella Dubs         EDD:   02/15/19                                      (08/07/18) ---------------------------------------------------------------------- Anatomy (Fetus B)  Thoracic:              Appears normal         Abdomen:                Appears normal  RVOT:  Appears normal         Kidneys:                Appear normal  Diaphragm:             Appears normal         Bladder:                Appears normal  Stomach:               Appears normal, left                         sided ---------------------------------------------------------------------- Cervix Uterus Adnexa  Cervix  Not visualized (advanced GA >24wks) ---------------------------------------------------------------------- Impression  Dichorionic-diamniotic twin pregnancy.  Patient has gestational diabetes. She has not been checking  her blood glucose and it is not possible to assess control. Our  nurse counseled her with help of interpreter (STRATUS).  Twin A: Maternal right, breech, anterior placenta. Amniotic  fluid is normal and good fetal activity is seen. Antenatal  testing is reassuring. NST is reactive. BPP 10/10.  Twin B: Maternal left, cephalic, left lateral placenta. Amniotic  fluid is normal and good fetal activity is seen. Antenatal  testing is reassuring. NST is reactive. BPP 10/10.  Patient was [facility] to [facility] for diabetic education. ---------------------------------------------------------------------- Recommendations  -Continue weekly antenatal testing till delivery.  -Delivery (or admission) is reasonable at 37 weeks if blood  glucose control is suboptimal or control cannot be assessed. ----------------------------------------------------------------------                  Noralee Space, MD Electronically Signed Final Report   01/14/2019 10:16 am  ----------------------------------------------------------------------  [place] Mfm Fetal Bpp W/nonstress  Result Date: 01/21/2019 ----------------------------------------------------------------------  OBSTETRICS REPORT                       (Signed Final 01/21/2019 09:57 am) ---------------------------------------------------------------------- Patient Info  ID #:       [id number]                          D.O.B.:  Nov 27, 1981 (38 yrs)  Name:       Teresa Clark                      Visit Date: 01/21/2019 08:50 am ---------------------------------------------------------------------- Performed By  Performed By:     Eden Lathe BS      Ref. Address:     [facility]                    RDMS RVT                                                             OB/Gyn Clinic                                                             [address]                                                             [place], [place]                                                             [address]  Attending:        Noralee Space MD        Location:         [facility]  Referred By:      [facility] for                    [facility]                    Healthcare ---------------------------------------------------------------------- Orders   #  Description                          Code         Ordered By   1  [place] MFM FETAL BPP                     29518.8      Michaelene Song   2  [place] MFM FETAL BPP                     41660.6      [facility]      W/NONSTRESS ADD'L Ammie Dalton  ----------------------------------------------------------------------   #  Order #                    Accession #                 Episode #   1  [id number]                  [id number]   2  [id number]                  [id number]                  [id number]   ---------------------------------------------------------------------- Indications   Twin pregnancy, di/di, third trimester         O43.043   Advanced maternal age multigravida 54+,        O43.523   third trimester   Gestational diabetes in pregnancy,  O24.419   unspecified control (poor control)   [redacted] weeks gestation of pregnancy                Z3A.36  ---------------------------------------------------------------------- Vital Signs                                                 Height:        4'11" ---------------------------------------------------------------------- Fetal Evaluation (Fetus A)  Num Of Fetuses:         2  Fetal Heart Rate(bpm):  144  Cardiac Activity:       Observed  Fetal Lie:              Maternal right side  Presentation:           Breech  Amniotic Fluid  AFI FV:      Within normal limits                              Largest Pocket(cm)                              4.4 ---------------------------------------------------------------------- Biophysical Evaluation (Fetus A)  Amniotic F.V:   Within normal limits       F. Tone:        Observed  F. Movement:    Observed                   N.S.T:          Reactive  F. Breathing:   Observed                   Score:          10/10 ---------------------------------------------------------------------- OB History  Gravidity:    4         Term:   3        Prem:   0        SAB:   0  TOP:          0       Ectopic:  0        Living: 3 ---------------------------------------------------------------------- Gestational Age (Fetus A)  LMP:           37w 3d        Date:  05/04/18                 EDD:   02/08/19  Best:          Stevie Kern 3d     Det. ByMarcella Dubs         EDD:   02/15/19                                      (08/07/18) ---------------------------------------------------------------------- Fetal Evaluation (Fetus B)  Num Of Fetuses:         2  Fetal Heart Rate(bpm):  120  Cardiac Activity:       Observed  Fetal Lie:              Maternal  left side  Presentation:           Transverse, head to maternal left  Amniotic Fluid  AFI FV:      Within normal limits                              Largest Pocket(cm)                              4.4 ---------------------------------------------------------------------- Biophysical Evaluation (Fetus B)  Amniotic F.V:   Within normal limits       F. Tone:        Observed  F. Movement:    Observed                   N.S.T:          Reactive  F. Breathing:   Observed                   Score:          10/10 ---------------------------------------------------------------------- Gestational Age (Fetus B)  LMP:           37w 3d        Date:  05/04/18                 EDD:   02/08/19  Best:          Stevie Kern 3d     Det. ByMarcella Dubs         EDD:   02/15/19                                      (08/07/18) ---------------------------------------------------------------------- Impression  Patient with dichorionic-diamniotic twin pregnancy returned  for antenatal testing. We spoke with her with help of  STRATUS interpreter (Burmese language). Her husband was  with her and he understands Burmese better and she speaks  Doyce Loose language better.  She has not been checking her blood glucose to assess  control. Patient has influenza B infection that was confirmed  by nasal swab PCR and takes Tamiflu.  She has mild shortness of breath and feels unwell.  Twin A: Maternal right, BREECH presentation. Amniotic fluid  is normal and good fetal activity is seen. Antenatal testing is  reassuring. NST is reactive. BPP 10/10.  Twin B: Maternal left, transverse lie and head to maternal left.  Amniotic fluid is normal and good fetal activity is seen.  Antenatal testing is reassuring. NST is reactive. BPP 10/10.  I counseled the patient with help of interpreter and  recommended evaluation at the [facility]. Patient had detailed  counseling by diabetic educator and her obstetricians and  she is unable to follow the instructions. The benefit of  delivery  at 37 weeks' gestation seems to outweigh potential neonatal  complications. ---------------------------------------------------------------------- Recommendations  -Consider inpatient management for ensuring adequate  treatment of influenza and control of diabetes.  -Recommend delivery at 37 weeks. ----------------------------------------------------------------------                  Noralee Space, MD Electronically Signed Final Report   01/21/2019 09:57 am ----------------------------------------------------------------------  [place] Mfm Fetal Bpp W/nonstress  Result Date: 01/14/2019 ----------------------------------------------------------------------  OBSTETRICS REPORT                       (Signed Final 01/14/2019 10:16 am) ---------------------------------------------------------------------- Patient Info  ID #:       [id number]  D.O.B.:  09/23/1981 (38 yrs)  Name:       Teresa Clark                      Visit Date: 01/14/2019 09:14 am ---------------------------------------------------------------------- Performed By  Performed By:     Lenise Arena        Ref. Address:     [facility]                                                             OB/Gyn Clinic                                                             [address]                                                             [place], [place]                                                             [address]  Attending:        Noralee Space MD        Location:         [facility]  Referred By:      [facility] for                    [facility]                    Healthcare ---------------------------------------------------------------------- Orders   #  Description                          Code         Ordered By   1  [place] MFM FETAL BPP                     60454.0      Bettey Costa       W/NONSTRESS  BOOKER   2  [place] MFM FETAL BPP                     96045.4      Bettey Costa      W/NONSTRESS ADD'L Ammie Dalton  ----------------------------------------------------------------------   #  Order #                    Accession #                 Episode #   1  [id number]                  [id number]                  [id number]   2  [id number]                  [id number]                  [id number]  ---------------------------------------------------------------------- Indications   Advanced maternal age multigravida 67+,        O4.523   third trimester   Gestational diabetes in pregnancy,             O24.419   unspecified control (poor control)   Twin pregnancy, di/di, third trimester         O30.043   [redacted] weeks gestation of pregnancy                Z3A.35  ---------------------------------------------------------------------- Vital Signs  Weight (lb): 142                               Height:        4'11"  BMI:         28.68 ---------------------------------------------------------------------- Fetal Evaluation (Fetus A)  Num Of Fetuses:         2  Fetal Heart Rate(bpm):  144  Cardiac Activity:       Observed  Fetal Lie:              Maternal right side  Presentation:           Breech  Placenta:               Anterior  P. Cord Insertion:      Previously Visualized  Membrane Desc:      Dividing Membrane seen - Dichorionic.  Amniotic Fluid  AFI FV:      Within normal limits                              Largest Pocket(cm)                              4.7 ---------------------------------------------------------------------- Biophysical Evaluation (Fetus A)  Amniotic F.V:   Within normal limits       F. Tone:        Observed  F. Movement:    Observed                   N.S.T:          Reactive  F. Breathing:   Observed  Score:          10/10 ---------------------------------------------------------------------- OB History  Gravidity:    4          Term:   3        Prem:   0        SAB:   0  TOP:          0       Ectopic:  0        Living: 3 ---------------------------------------------------------------------- Gestational Age (Fetus A)  LMP:           36w 3d        Date:  05/04/18                 EDD:   02/08/19  Best:          Consuello Closs 3d     Det. ByMarcella Dubs         EDD:   02/15/19                                      (08/07/18) ---------------------------------------------------------------------- Anatomy (Fetus A)  Thoracic:              Appears normal         Kidneys:                Appear normal  Stomach:               Appears normal, left   Bladder:                Appears normal                         sided  Abdomen:               Appears normal ---------------------------------------------------------------------- Fetal Evaluation (Fetus B)  Num Of Fetuses:         2  Fetal Heart Rate(bpm):  144  Cardiac Activity:       Observed  Fetal Lie:              Maternal left side  Presentation:           Cephalic  Placenta:               Left lateral  P. Cord Insertion:      Previously Visualized  Membrane Desc:      Dividing Membrane seen - Dichorionic.  Amniotic Fluid  AFI FV:      Within normal limits                              Largest Pocket(cm)                              5.84 ---------------------------------------------------------------------- Biophysical Evaluation (Fetus B)  Amniotic F.V:   Within normal limits       F. Tone:        Observed  F. Movement:    Observed                   N.S.T:          Reactive  F. Breathing:   Observed  Score:          10/10 ---------------------------------------------------------------------- Gestational Age (Fetus B)  LMP:           36w 3d        Date:  05/04/18                 EDD:   02/08/19  Best:          Consuello Closs 3d     Det. ByMarcella Dubs         EDD:   02/15/19                                      (08/07/18)  ---------------------------------------------------------------------- Anatomy (Fetus B)  Thoracic:              Appears normal         Abdomen:                Appears normal  RVOT:                  Appears normal         Kidneys:                Appear normal  Diaphragm:             Appears normal         Bladder:                Appears normal  Stomach:               Appears normal, left                         sided ---------------------------------------------------------------------- Cervix Uterus Adnexa  Cervix  Not visualized (advanced GA >24wks) ---------------------------------------------------------------------- Impression  Dichorionic-diamniotic twin pregnancy.  Patient has gestational diabetes. She has not been checking  her blood glucose and it is not possible to assess control. Our  nurse counseled her with help of interpreter (STRATUS).  Twin A: Maternal right, breech, anterior placenta. Amniotic  fluid is normal and good fetal activity is seen. Antenatal  testing is reassuring. NST is reactive. BPP 10/10.  Twin B: Maternal left, cephalic, left lateral placenta. Amniotic  fluid is normal and good fetal activity is seen. Antenatal  testing is reassuring. NST is reactive. BPP 10/10.  Patient was taken to [facility] for diabetic education. ---------------------------------------------------------------------- Recommendations  -Continue weekly antenatal testing till delivery.  -Delivery (or admission) is reasonable at 37 weeks if blood  glucose control is suboptimal or control cannot be assessed. ----------------------------------------------------------------------                  Noralee Space, MD Electronically Signed Final Report   01/14/2019 10:16 am ----------------------------------------------------------------------  [place] Mfm Fetal Bpp Wo Non Stress  Result Date: 01/07/2019 ----------------------------------------------------------------------  OBSTETRICS REPORT                       (Signed  Final 01/07/2019 02:17 pm) ---------------------------------------------------------------------- Patient Info  ID #:       [id number]                          D.O.B.:  25-May-1981 (38 yrs)  Name:       THAILY HACKWORTH Winterhalter  Visit Date: 01/07/2019 09:06 am ---------------------------------------------------------------------- Performed By  Performed By:     Percell Boston          Ref. Address:      [facility]                                                              OB/Gyn Clinic                                                              [address]                                                              [place], [place]                                                              [address]  Attending:        Lin Landsman      Location:          [facility]                    MD  Referred By:      [facility] for                    [facility]                    Healthcare ---------------------------------------------------------------------- Orders   #  Description                           Code        Ordered By   1  [place] MFM OB FOLLOW UP                   [id number]    RAVI SHANKAR   2  [place] MFM OB FOLLOW UP ADDL              19147.82    RAVI SHANKAR      GEST  3  [place] MFM FETAL BPP WO NON               76819.01    RAVI SHANKAR      STRESS   4  [place] MFM FETAL BPP WO NST               76819.1     RAVI [facility]      ADDL GESTATION  ----------------------------------------------------------------------   #  Order #                     Accession #                Episode #   1  [id number]                   [id number]                 [id number]   2  [id number]                   [id number]                 [id number]   3  [id number]                   [id number]                 [id number]   4  [id number]                   [id number]                 [id number]   ---------------------------------------------------------------------- Indications   Advanced maternal age multigravida 50+,         O88.523   third trimester   Gestational diabetes in pregnancy,              O24.419   unspecified control (poor control)   Twin pregnancy, di/di, third trimester          O30.043   [redacted] weeks gestation of pregnancy                 Z3A.34  ---------------------------------------------------------------------- Vital Signs                                                 Height:        4'11" ---------------------------------------------------------------------- Fetal Evaluation (Fetus A)  Num Of Fetuses:          2  Fetal Heart              144  Rate(bpm):  Cardiac Activity:        Observed  Fetal Lie:               Maternal right side  Presentation:            Cephalic  Placenta:                Anterior  P. Cord Insertion:       Previously Visualized  Membrane Desc:      Dividing Membrane seen - Dichorionic.  Amniotic Fluid  AFI FV:      Within normal limits  Largest Pocket(cm)                              4.7 ---------------------------------------------------------------------- Biophysical Evaluation (Fetus A)  Amniotic F.V:   Within normal limits       F. Tone:         Observed  F. Movement:    Observed                   Score:           8/8  F. Breathing:   Observed ---------------------------------------------------------------------- Biometry (Fetus A)  BPD:      80.8  mm     G. Age:  32w 3d          6  %    CI:         78.87  %    70 - 86                                                          FL/HC:       21.4  %    19.4 - 21.8  HC:      287.7  mm     G. Age:  31w 4d        < 3  %    HC/AC:       0.98       0.96 - 1.11  AC:      292.5  mm     G. Age:  33w 2d         22  %    FL/BPD:      76.1  %    71 - 87  FL:       61.5  mm     G. Age:  31w 6d        < 3  %    FL/AC:       21.0  %    20 - 24  Est. FW:    2013   g      4 lb 7 oz     26  %     FW  Discordancy        17   %                     m ---------------------------------------------------------------------- OB History  Gravidity:    4         Term:   3        Prem:   0         SAB:   0  TOP:          0       Ectopic:  0        Living: 3 ---------------------------------------------------------------------- Gestational Age (Fetus A)  LMP:           35w 3d        Date:  05/04/18                 EDD:    02/08/19  U/S Today:     32w 2d  EDD:    03/02/19  Best:          34w 3d     Det. ByMarcella Dubs         EDD:    02/15/19                                      (08/07/18) ---------------------------------------------------------------------- Anatomy (Fetus A)  Cranium:               Appears normal         Aortic Arch:            Previously seen  Cavum:                 Previously seen        Ductal Arch:            Previously seen  Ventricles:            Previously seen        Diaphragm:              Appears normal  Choroid Plexus:        Previously seen        Stomach:                Appears normal,                                                                        left sided  Cerebellum:            Previously seen        Abdomen:                Previously seen  Posterior Fossa:       Previously seen        Abdominal Wall:         UVV 1.4 cm  Nuchal Fold:           Previously seen        Cord Vessels:           Previously seen  Face:                  Orbits and profile     Kidneys:                Appear normal                         previously seen  Lips:                  Previously seen        Bladder:                Appears normal  Thoracic:              Appears normal         Spine:                  Previously seen  Heart:                 Previously  seen        Upper Extremities:      Previously seen  RVOT:                  Previously seen        Lower Extremities:      Previously seen  LVOT:                  Previously seen  Other:  Heels and Nasal bone  previously visualized. Technically difficult due          to fetal position. ---------------------------------------------------------------------- Fetal Evaluation (Fetus B)  Num Of Fetuses:          2  Fetal Heart              136  Rate(bpm):  Cardiac Activity:        Observed  Fetal Lie:               Maternal left side  Presentation:            Cephalic  Placenta:                Left lateral  P. Cord Insertion:       Previously Visualized  Membrane Desc:      Dividing Membrane seen - Dichorionic.  Amniotic Fluid  AFI FV:      Within normal limits                              Largest Pocket(cm)                              6.7 ---------------------------------------------------------------------- Biophysical Evaluation (Fetus B)  Amniotic F.V:   Within normal limits       F. Tone:         Observed  F. Movement:    Observed                   Score:           8/8  F. Breathing:   Observed ---------------------------------------------------------------------- Biometry (Fetus B)  BPD:      84.7  mm     G. Age:  34w 1d         40  %    CI:         71.52  %    70 - 86                                                          FL/HC:       20.4  %    19.4 - 21.8  HC:      318.9  mm     G. Age:  35w 6d         52  %    HC/AC:       1.04       0.96 - 1.11  AC:      306.2  mm     G. Age:  34w 4d         59  %    FL/BPD:      76.7  %    71 -  87  FL:         65  mm     G. Age:  33w 4d         20  %    FL/AC:       21.2  %    20 - 24  Est. FW:    2415   g      5 lb 5 oz     59  %     FW Discordancy     0 \ 17  %                     m ---------------------------------------------------------------------- Gestational Age (Fetus B)  LMP:           35w 3d        Date:  05/04/18                 EDD:    02/08/19  U/S Today:     34w 4d                                        EDD:    02/14/19  Best:          34w 3d     Det. ByMarcella Dubs         EDD:    02/15/19                                      (08/07/18)  ---------------------------------------------------------------------- Anatomy (Fetus B)  Cranium:               Appears normal         Aortic Arch:            Previously seen  Cavum:                 Previously seen        Ductal Arch:            Previously seen  Ventricles:            Previously seen        Diaphragm:              Previously seen  Choroid Plexus:        Previously seen        Stomach:                Appears normal,                                                                        left sided  Cerebellum:            Previously seen        Abdomen:                Previously seen  Posterior Fossa:       Previously seen        Abdominal Wall:         Previously seen  Nuchal Fold:  Previously seen        Cord Vessels:           Previously seen  Face:                  Orbits and profile     Kidneys:                Appear normal                         previously seen  Lips:                  Previously seen        Bladder:                Appears normal  Thoracic:              Appears normal         Spine:                  Previously seen  Heart:                 Previously seen        Upper Extremities:      Previously seen  RVOT:                  Previously seen        Lower Extremities:      Previously seen  LVOT:                  Previously seen  Other:  Technically difficult due to fetal position. ---------------------------------------------------------------------- Cervix Uterus Adnexa  Cervix  Not visualized (advanced GA >24wks) ---------------------------------------------------------------------- Impression  Normal interval growth.  Diamniotic Dichorionic Twin pregnancy  Gestational diabetes ---------------------------------------------------------------------- Recommendations  Continue weekly BPP/NST  Follow upgrowth scheduled in 3 weeks  Consider delivery between 37-38 weeks. ----------------------------------------------------------------------               Lin Landsman, MD  Electronically Signed Final Report   01/07/2019 02:17 pm ----------------------------------------------------------------------  [place] Mfm Ob Follow Up  Result Date: 01/07/2019 ----------------------------------------------------------------------  OBSTETRICS REPORT                       (Signed Final 01/07/2019 02:17 pm) ---------------------------------------------------------------------- Patient Info  ID #:       [id number]                          D.O.B.:  08-21-1981 (38 yrs)  Name:       Teresa Clark                      Visit Date: 01/07/2019 09:06 am ---------------------------------------------------------------------- Performed By  Performed By:     Percell Boston          Ref. Address:      [facility]                    [facility]                                                              OB/Gyn Clinic  [address]                                                              [place], [place]                                                              [address]  Attending:        Lin Landsman      Location:          [facility]                    MD  Referred By:      [facility] for                    [facility]                    Healthcare ---------------------------------------------------------------------- Orders   #  Description                           Code        Ordered By   1  [place] MFM OB FOLLOW UP                   [id number]    RAVI SHANKAR   2  [place] MFM OB FOLLOW UP ADDL              60454.09    RAVI SHANKAR      GEST   3  [place] MFM FETAL BPP WO NON               76819.01    RAVI SHANKAR      STRESS   4  [place] MFM FETAL BPP WO NST               81191.4     [facility]      ADDL GESTATION  ----------------------------------------------------------------------   #  Order #                     Accession #                Episode #   1  [id number]                    [id number]                 [id number]   2  [id number]  [id number]                 [id number]   3  [id number]                   [id number]                 [id number]   4  [id number]                   [id number]                 [id number]  ---------------------------------------------------------------------- Indications   Advanced maternal age multigravida 51+,         O63.523   third trimester   Gestational diabetes in pregnancy,              O24.419   unspecified control (poor control)   Twin pregnancy, di/di, third trimester          O30.043   [redacted] weeks gestation of pregnancy                 Z3A.34  ---------------------------------------------------------------------- Vital Signs                                                 Height:        4'11" ---------------------------------------------------------------------- Fetal Evaluation (Fetus A)  Num Of Fetuses:          2  Fetal Heart              144  Rate(bpm):  Cardiac Activity:        Observed  Fetal Lie:               Maternal right side  Presentation:            Cephalic  Placenta:                Anterior  P. Cord Insertion:       Previously Visualized  Membrane Desc:      Dividing Membrane seen - Dichorionic.  Amniotic Fluid  AFI FV:      Within normal limits                              Largest Pocket(cm)                              4.7 ---------------------------------------------------------------------- Biophysical Evaluation (Fetus A)  Amniotic F.V:   Within normal limits       F. Tone:         Observed  F. Movement:    Observed                   Score:           8/8  F. Breathing:   Observed ---------------------------------------------------------------------- Biometry (Fetus A)  BPD:      80.8  mm     G. Age:  32w 3d          6  %    CI:         78.87  %    70 - 86  FL/HC:       21.4  %    19.4 - 21.8  HC:      287.7  mm     G. Age:  31w 4d        < 3  %    HC/AC:        0.98       0.96 - 1.11  AC:      292.5  mm     G. Age:  33w 2d         22  %    FL/BPD:      76.1  %    71 - 87  FL:       61.5  mm     G. Age:  31w 6d        < 3  %    FL/AC:       21.0  %    20 - 24  Est. FW:    2013   g      4 lb 7 oz     26  %     FW Discordancy        17   %                     m ---------------------------------------------------------------------- OB History  Gravidity:    4         Term:   3        Prem:   0         SAB:   0  TOP:          0       Ectopic:  0        Living: 3 ---------------------------------------------------------------------- Gestational Age (Fetus A)  LMP:           35w 3d        Date:  05/04/18                 EDD:    02/08/19  U/S Today:     32w 2d                                        EDD:    03/02/19  Best:          34w 3d     Det. By:  Marcella Dubs         EDD:    02/15/19                                      (08/07/18) ---------------------------------------------------------------------- Anatomy (Fetus A)  Cranium:               Appears normal         Aortic Arch:            Previously seen  Cavum:                 Previously seen        Ductal Arch:            Previously seen  Ventricles:            Previously seen        Diaphragm:  Appears normal  Choroid Plexus:        Previously seen        Stomach:                Appears normal,                                                                        left sided  Cerebellum:            Previously seen        Abdomen:                Previously seen  Posterior Fossa:       Previously seen        Abdominal Wall:         UVV 1.4 cm  Nuchal Fold:           Previously seen        Cord Vessels:           Previously seen  Face:                  Orbits and profile     Kidneys:                Appear normal                         previously seen  Lips:                  Previously seen        Bladder:                Appears normal  Thoracic:              Appears normal         Spine:                   Previously seen  Heart:                 Previously seen        Upper Extremities:      Previously seen  RVOT:                  Previously seen        Lower Extremities:      Previously seen  LVOT:                  Previously seen  Other:  Heels and Nasal bone previously visualized. Technically difficult due          to fetal position. ---------------------------------------------------------------------- Fetal Evaluation (Fetus B)  Num Of Fetuses:          2  Fetal Heart              136  Rate(bpm):  Cardiac Activity:        Observed  Fetal Lie:               Maternal left side  Presentation:            Cephalic  Placenta:                Left  lateral  P. Cord Insertion:       Previously Visualized  Membrane Desc:      Dividing Membrane seen - Dichorionic.  Amniotic Fluid  AFI FV:      Within normal limits                              Largest Pocket(cm)                              6.7 ---------------------------------------------------------------------- Biophysical Evaluation (Fetus B)  Amniotic F.V:   Within normal limits       F. Tone:         Observed  F. Movement:    Observed                   Score:           8/8  F. Breathing:   Observed ---------------------------------------------------------------------- Biometry (Fetus B)  BPD:      84.7  mm     G. Age:  34w 1d         40  %    CI:         71.52  %    70 - 86                                                          FL/HC:       20.4  %    19.4 - 21.8  HC:      318.9  mm     G. Age:  35w 6d         52  %    HC/AC:       1.04       0.96 - 1.11  AC:      306.2  mm     G. Age:  34w 4d         59  %    FL/BPD:      76.7  %    71 - 87  FL:         65  mm     G. Age:  33w 4d         20  %    FL/AC:       21.2  %    20 - 24  Est. FW:    2415   g      5 lb 5 oz     59  %     FW Discordancy     0 \ 17  %                     m ---------------------------------------------------------------------- Gestational Age (Fetus B)  LMP:           35w 3d        Date:   05/04/18                 EDD:    02/08/19  U/S Today:     34w 4d  EDD:    02/14/19  Best:          34w 3d     Det. ByMarcella Dubs         EDD:    02/15/19                                      (08/07/18) ---------------------------------------------------------------------- Anatomy (Fetus B)  Cranium:               Appears normal         Aortic Arch:            Previously seen  Cavum:                 Previously seen        Ductal Arch:            Previously seen  Ventricles:            Previously seen        Diaphragm:              Previously seen  Choroid Plexus:        Previously seen        Stomach:                Appears normal,                                                                        left sided  Cerebellum:            Previously seen        Abdomen:                Previously seen  Posterior Fossa:       Previously seen        Abdominal Wall:         Previously seen  Nuchal Fold:           Previously seen        Cord Vessels:           Previously seen  Face:                  Orbits and profile     Kidneys:                Appear normal                         previously seen  Lips:                  Previously seen        Bladder:                Appears normal  Thoracic:              Appears normal         Spine:                  Previously seen  Heart:                 Previously seen  Upper Extremities:      Previously seen  RVOT:                  Previously seen        Lower Extremities:      Previously seen  LVOT:                  Previously seen  Other:  Technically difficult due to fetal position. ---------------------------------------------------------------------- Cervix Uterus Adnexa  Cervix  Not visualized (advanced GA >24wks) ---------------------------------------------------------------------- Impression  Normal interval growth.  Diamniotic Dichorionic Twin pregnancy  Gestational diabetes  ---------------------------------------------------------------------- Recommendations  Continue weekly BPP/NST  Follow upgrowth scheduled in 3 weeks  Consider delivery between 37-38 weeks. ----------------------------------------------------------------------               Lin Landsman, MD Electronically Signed Final Report   01/07/2019 02:17 pm ----------------------------------------------------------------------  [place] Mfm Ob Follow Up Addl Gest  Result Date: 01/07/2019 ----------------------------------------------------------------------  OBSTETRICS REPORT                       (Signed Final 01/07/2019 02:17 pm) ---------------------------------------------------------------------- Patient Info  ID #:       [id number]                          D.O.B.:  1981-08-13 (38 yrs)  Name:       Teresa Clark                      Visit Date: 01/07/2019 09:06 am ---------------------------------------------------------------------- Performed By  Performed By:     Percell Boston          Ref. Address:      [facility]                                                              OB/Gyn Clinic                                                              [address]                                                              [place], [place]  [address]  Attending:        Lin Landsman      Location:          [facility]                    MD  Referred By:      [facility] for                    [facility]                    Healthcare ---------------------------------------------------------------------- Orders   #  Description                           Code        Ordered By   1  [place] MFM OB FOLLOW UP                   [id number]    RAVI SHANKAR   2  [place] MFM OB FOLLOW UP ADDL              96295.28    RAVI SHANKAR      GEST   3  [place] MFM FETAL BPP  WO NON               76819.01    RAVI SHANKAR      STRESS   4  [place] MFM FETAL BPP WO NST               41324.4     RAVI [facility]      ADDL GESTATION  ----------------------------------------------------------------------   #  Order #                     Accession #                Episode #   1  [id number]                   [id number]                 [id number]   2  [id number]                   [id number]                 [id number]   3  [id number]                   [id number]                 [id number]   4  [id number]                   [id number]                 [id number]  ---------------------------------------------------------------------- Indications   Advanced maternal age multigravida 33+,         O61.523   third trimester   Gestational diabetes in pregnancy,              O24.419   unspecified control (poor control)   Twin pregnancy, di/di, third trimester          O30.043   [redacted] weeks gestation of pregnancy  Z3A.34  ---------------------------------------------------------------------- Vital Signs                                                 Height:        4'11" ---------------------------------------------------------------------- Fetal Evaluation (Fetus A)  Num Of Fetuses:          2  Fetal Heart              144  Rate(bpm):  Cardiac Activity:        Observed  Fetal Lie:               Maternal right side  Presentation:            Cephalic  Placenta:                Anterior  P. Cord Insertion:       Previously Visualized  Membrane Desc:      Dividing Membrane seen - Dichorionic.  Amniotic Fluid  AFI FV:      Within normal limits                              Largest Pocket(cm)                              4.7 ---------------------------------------------------------------------- Biophysical Evaluation (Fetus A)  Amniotic F.V:   Within normal limits       F. Tone:         Observed  F. Movement:    Observed                   Score:           8/8  F. Breathing:   Observed  ---------------------------------------------------------------------- Biometry (Fetus A)  BPD:      80.8  mm     G. Age:  32w 3d          6  %    CI:         78.87  %    70 - 86                                                          FL/HC:       21.4  %    19.4 - 21.8  HC:      287.7  mm     G. Age:  31w 4d        < 3  %    HC/AC:       0.98       0.96 - 1.11  AC:      292.5  mm     G. Age:  33w 2d         22  %    FL/BPD:      76.1  %    71 - 87  FL:       61.5  mm     G. Age:  31w 6d        < 3  %    FL/AC:  21.0  %    20 - 24  Est. FW:    2013   g      4 lb 7 oz     26  %     FW Discordancy        17   %                     m ---------------------------------------------------------------------- OB History  Gravidity:    4         Term:   3        Prem:   0         SAB:   0  TOP:          0       Ectopic:  0        Living: 3 ---------------------------------------------------------------------- Gestational Age (Fetus A)  LMP:           35w 3d        Date:  05/04/18                 EDD:    02/08/19  U/S Today:     32w 2d                                        EDD:    03/02/19  Best:          34w 3d     Det. ByMarcella Dubs         EDD:    02/15/19                                      (08/07/18) ---------------------------------------------------------------------- Anatomy (Fetus A)  Cranium:               Appears normal         Aortic Arch:            Previously seen  Cavum:                 Previously seen        Ductal Arch:            Previously seen  Ventricles:            Previously seen        Diaphragm:              Appears normal  Choroid Plexus:        Previously seen        Stomach:                Appears normal,                                                                        left sided  Cerebellum:            Previously seen        Abdomen:                Previously seen  Posterior  Fossa:       Previously seen        Abdominal Wall:         UVV 1.4 cm  Nuchal Fold:            Previously seen        Cord Vessels:           Previously seen  Face:                  Orbits and profile     Kidneys:                Appear normal                         previously seen  Lips:                  Previously seen        Bladder:                Appears normal  Thoracic:              Appears normal         Spine:                  Previously seen  Heart:                 Previously seen        Upper Extremities:      Previously seen  RVOT:                  Previously seen        Lower Extremities:      Previously seen  LVOT:                  Previously seen  Other:  Heels and Nasal bone previously visualized. Technically difficult due          to fetal position. ---------------------------------------------------------------------- Fetal Evaluation (Fetus B)  Num Of Fetuses:          2  Fetal Heart              136  Rate(bpm):  Cardiac Activity:        Observed  Fetal Lie:               Maternal left side  Presentation:            Cephalic  Placenta:                Left lateral  P. Cord Insertion:       Previously Visualized  Membrane Desc:      Dividing Membrane seen - Dichorionic.  Amniotic Fluid  AFI FV:      Within normal limits                              Largest Pocket(cm)                              6.7 ---------------------------------------------------------------------- Biophysical Evaluation (Fetus B)  Amniotic F.V:   Within normal limits       F. Tone:         Observed  F. Movement:    Observed  Score:           8/8  F. Breathing:   Observed ---------------------------------------------------------------------- Biometry (Fetus B)  BPD:      84.7  mm     G. Age:  34w 1d         40  %    CI:         71.52  %    70 - 86                                                          FL/HC:       20.4  %    19.4 - 21.8  HC:      318.9  mm     G. Age:  35w 6d         52  %    HC/AC:       1.04       0.96 - 1.11  AC:      306.2  mm     G. Age:  34w 4d         59  %    FL/BPD:      76.7   %    71 - 87  FL:         65  mm     G. Age:  33w 4d         20  %    FL/AC:       21.2  %    20 - 24  Est. FW:    2415   g      5 lb 5 oz     59  %     FW Discordancy     0 \ 17  %                     m ---------------------------------------------------------------------- Gestational Age (Fetus B)  LMP:           35w 3d        Date:  05/04/18                 EDD:    02/08/19  U/S Today:     34w 4d                                        EDD:    02/14/19  Best:          34w 3d     Det. By:  Marcella Dubs         EDD:    02/15/19                                      (08/07/18) ---------------------------------------------------------------------- Anatomy (Fetus B)  Cranium:               Appears normal         Aortic Arch:            Previously seen  Cavum:                 Previously seen  Ductal Arch:            Previously seen  Ventricles:            Previously seen        Diaphragm:              Previously seen  Choroid Plexus:        Previously seen        Stomach:                Appears normal,                                                                        left sided  Cerebellum:            Previously seen        Abdomen:                Previously seen  Posterior Fossa:       Previously seen        Abdominal Wall:         Previously seen  Nuchal Fold:           Previously seen        Cord Vessels:           Previously seen  Face:                  Orbits and profile     Kidneys:                Appear normal                         previously seen  Lips:                  Previously seen        Bladder:                Appears normal  Thoracic:              Appears normal         Spine:                  Previously seen  Heart:                 Previously seen        Upper Extremities:      Previously seen  RVOT:                  Previously seen        Lower Extremities:      Previously seen  LVOT:                  Previously seen  Other:  Technically difficult due to fetal position.  ---------------------------------------------------------------------- Cervix Uterus Adnexa  Cervix  Not visualized (advanced GA >24wks) ---------------------------------------------------------------------- Impression  Normal interval growth.  Diamniotic Dichorionic Twin pregnancy  Gestational diabetes ---------------------------------------------------------------------- Recommendations  Continue weekly BPP/NST  Follow upgrowth scheduled in 3 weeks  Consider delivery between 37-38 weeks. ----------------------------------------------------------------------               Lin Landsman, MD Electronically Signed Final  Report   01/07/2019 02:17 pm ----------------------------------------------------------------------  [place] Mfm Fetal Bpp Wo Nst Addl Gestation  Result Date: 01/07/2019 ----------------------------------------------------------------------  OBSTETRICS REPORT                       (Signed Final 01/07/2019 02:17 pm) ---------------------------------------------------------------------- Patient Info  ID #:       [id number]                          D.O.B.:  [birth year] (38 yrs)  Name:       Teresa Clark                      Visit Date: 01/07/2019 09:06 am ---------------------------------------------------------------------- Performed By  Performed By:     Percell BostonHeather Waken          Ref. Address:      [facility]                    [facility]                                                              OB/Gyn Clinic                                                              [address]                                                              [address]                                                              [place], [place]                                                              [id number]  Attending:        Lin Landsmanorenthian Booker      [facility]          [facility]                    MD  Referred By:      [facility]                    Center for                    [facility]  [facility] ---------------------------------------------------------------------- Orders   #  Description                           Code        Ordered By   1  [place] MFM OB FOLLOW UP                   [id number]    RAVI SHANKAR   2  [place] MFM OB FOLLOW UP ADDL              16109.60    RAVI SHANKAR      GEST   3  [place] MFM FETAL BPP WO NON               76819.01    RAVI SHANKAR      STRESS   4  [place] MFM FETAL BPP WO NST               45409.8     RAVI [facility]      ADDL GESTATION  ----------------------------------------------------------------------   #  Order #                     Accession #                Episode #   1  [id number]                   [id number]                 [id number]   2  [id number]                   [id number]                 [id number]   3  [id number]                   [id number]                 [id number]   4  [id number]                   [id number]                 [id number]  ---------------------------------------------------------------------- Indications   Advanced maternal age multigravida 27+,         O75.523   third trimester   Gestational diabetes in pregnancy,              O24.419   unspecified control (poor control)   Twin pregnancy, di/di, third trimester          O30.043   [redacted] weeks gestation of pregnancy                 Z3A.34  ---------------------------------------------------------------------- Vital Signs                                                 Height:        4'11" ---------------------------------------------------------------------- Fetal Evaluation (Fetus A)  Num Of Fetuses:          2  Fetal Heart              144  Rate(bpm):  Cardiac Activity:        Observed  Fetal Lie:  Maternal right side  Presentation:            Cephalic  Placenta:                Anterior  P. Cord Insertion:       Previously Visualized  Membrane Desc:      Dividing Membrane seen - Dichorionic.  Amniotic Fluid  AFI FV:      Within normal limits                              Largest Pocket(cm)                               4.7 ---------------------------------------------------------------------- Biophysical Evaluation (Fetus A)  Amniotic F.V:   Within normal limits       F. Tone:         Observed  F. Movement:    Observed                   Score:           8/8  F. Breathing:   Observed ---------------------------------------------------------------------- Biometry (Fetus A)  BPD:      80.8  mm     G. Age:  32w 3d          6  %    CI:         78.87  %    70 - 86                                                          FL/HC:       21.4  %    19.4 - 21.8  HC:      287.7  mm     G. Age:  31w 4d        < 3  %    HC/AC:       0.98       0.96 - 1.11  AC:      292.5  mm     G. Age:  33w 2d         22  %    FL/BPD:      76.1  %    71 - 87  FL:       61.5  mm     G. Age:  31w 6d        < 3  %    FL/AC:       21.0  %    20 - 24  Est. FW:    2013   g      4 lb 7 oz     26  %     FW Discordancy        17   %                     m ---------------------------------------------------------------------- OB History  Gravidity:    4         Term:   3        Prem:   0         SAB:   0  TOP:  0       Ectopic:  0        Living: 3 ---------------------------------------------------------------------- Gestational Age (Fetus A)  LMP:           35w 3d        Date:  05/04/18                 EDD:    02/08/19  U/S Today:     32w 2d                                        EDD:    03/02/19  Best:          34w 3d     Det. ByMarcella Dubs         EDD:    02/15/19                                      (08/07/18) ---------------------------------------------------------------------- Anatomy (Fetus A)  Cranium:               Appears normal         Aortic Arch:            Previously seen  Cavum:                 Previously seen        Ductal Arch:            Previously seen  Ventricles:            Previously seen        Diaphragm:              Appears normal  Choroid Plexus:        Previously seen        Stomach:                Appears  normal,                                                                        left sided  Cerebellum:            Previously seen        Abdomen:                Previously seen  Posterior Fossa:       Previously seen        Abdominal Wall:         UVV 1.4 cm  Nuchal Fold:           Previously seen        Cord Vessels:           Previously seen  Face:                  Orbits and profile     Kidneys:                Appear normal  previously seen  Lips:                  Previously seen        Bladder:                Appears normal  Thoracic:              Appears normal         Spine:                  Previously seen  Heart:                 Previously seen        Upper Extremities:      Previously seen  RVOT:                  Previously seen        Lower Extremities:      Previously seen  LVOT:                  Previously seen  Other:  Heels and Nasal bone previously visualized. Technically difficult due          to fetal position. ---------------------------------------------------------------------- Fetal Evaluation (Fetus B)  Num Of Fetuses:          2  Fetal Heart              136  Rate(bpm):  Cardiac Activity:        Observed  Fetal Lie:               Maternal left side  Presentation:            Cephalic  Placenta:                Left lateral  P. Cord Insertion:       Previously Visualized  Membrane Desc:      Dividing Membrane seen - Dichorionic.  Amniotic Fluid  AFI FV:      Within normal limits                              Largest Pocket(cm)                              6.7 ---------------------------------------------------------------------- Biophysical Evaluation (Fetus B)  Amniotic F.V:   Within normal limits       F. Tone:         Observed  F. Movement:    Observed                   Score:           8/8  F. Breathing:   Observed ---------------------------------------------------------------------- Biometry (Fetus B)  BPD:      84.7  mm     G. Age:  34w 1d         40  %    CI:          71.52  %    70 - 86                                                          FL/HC:  20.4  %    19.4 - 21.8  HC:      318.9  mm     G. Age:  35w 6d         52  %    HC/AC:       1.04       0.96 - 1.11  AC:      306.2  mm     G. Age:  34w 4d         59  %    FL/BPD:      76.7  %    71 - 87  FL:         65  mm     G. Age:  33w 4d         20  %    FL/AC:       21.2  %    20 - 24  Est. FW:    2415   g      5 lb 5 oz     59  %     FW Discordancy     0 \ 17  %                     m ---------------------------------------------------------------------- Gestational Age (Fetus B)  LMP:           35w 3d        Date:  05/04/18                 EDD:    02/08/19  U/S Today:     34w 4d                                        EDD:    02/14/19  Best:          34w 3d     Det. ByMarcella Dubs         EDD:    02/15/19                                      (08/07/18) ---------------------------------------------------------------------- Anatomy (Fetus B)  Cranium:               Appears normal         Aortic Arch:            Previously seen  Cavum:                 Previously seen        Ductal Arch:            Previously seen  Ventricles:            Previously seen        Diaphragm:              Previously seen  Choroid Plexus:        Previously seen        Stomach:                Appears normal,  left sided  Cerebellum:            Previously seen        Abdomen:                Previously seen  Posterior Fossa:       Previously seen        Abdominal Wall:         Previously seen  Nuchal Fold:           Previously seen        Cord Vessels:           Previously seen  Face:                  Orbits and profile     Kidneys:                Appear normal                         previously seen  Lips:                  Previously seen        Bladder:                Appears normal  Thoracic:              Appears normal         Spine:                  Previously seen   Heart:                 Previously seen        Upper Extremities:      Previously seen  RVOT:                  Previously seen        Lower Extremities:      Previously seen  LVOT:                  Previously seen  Other:  Technically difficult due to fetal position. ---------------------------------------------------------------------- Cervix Uterus Adnexa  Cervix  Not visualized (advanced GA >24wks) ---------------------------------------------------------------------- Impression  Normal interval growth.  Diamniotic Dichorionic Twin pregnancy  Gestational diabetes ---------------------------------------------------------------------- Recommendations  Continue weekly BPP/NST  Follow upgrowth scheduled in 3 weeks  Consider delivery between 37-38 weeks. ----------------------------------------------------------------------               Lin Landsman, MD Electronically Signed Final Report   01/07/2019 02:17 pm ----------------------------------------------------------------------  US Abdomen Limited Ruq  Result Date: 01/21/2019 CLINICAL DATA:  Elevated liver function studies. EXAM: ULTRASOUND ABDOMEN LIMITED RIGHT UPPER QUADRANT COMPARISON:  None. FINDINGS: Gallbladder: The gallbladder is somewhat contracted and thick walled, with wall thickness measuring 6.8 mm. Gallbladder is filled with sludge with central echogenic focus measuring 6 mm diameter, possibly a nonshadowing stone, sludge ball, or polyp. Murphy's sign is negative. Common bile duct: Diameter: 3.3 mm, normal Liver: No focal lesion identified. Within normal limits in parenchymal echogenicity. Portal vein is patent on color Doppler imaging with normal direction of blood flow towards the liver. Incidental note of prominent hydronephrosis of the right kidney, incompletely evaluated. IMPRESSION: 1. Diffuse gallbladder wall thickening with sludge and nonshadowing stone versus polyp centrally. Murphy's sign is negative. Appearance is nonspecific for  cholecystitis. No bile duct dilatation. 2. Incidental note of prominent hydronephrosis of the right kidney. Electronically Signed  By: Burman Nieves M.D.   On: 01/21/2019 20:57    Microbiology: Recent Results (from the past 240 hour(s))  Group A Strep by PCR     Status: None   Collection Time: 01/19/19  3:00 AM  Result Value Ref Range Status   Group A Strep by PCR NOT DETECTED NOT DETECTED Final    Comment: Performed at [facility], [address], [place], [place] [address]  MRSA PCR Screening     Status: None   Collection Time: 01/21/19  7:44 PM  Result Value Ref Range Status   MRSA by PCR NEGATIVE NEGATIVE Final    Comment:        The GeneXpert MRSA Assay (FDA approved for NASAL specimens only), is one component of a comprehensive MRSA colonization surveillance program. It is not intended to diagnose MRSA infection nor to guide or monitor treatment for MRSA infections. Performed at [facility], [address], [place], [place] [address]   Culture, blood (routine x 2)     Status: None (Preliminary result)   Collection Time: 01/23/19 12:20 PM  Result Value Ref Range Status   Specimen Description BLOOD RIGHT ARM UPPER  Final   Special Requests   Final    BOTTLES DRAWN AEROBIC ONLY Blood Culture adequate volume   Culture   Final    NO GROWTH < 24 HOURS Performed at [facility], [address] [place], [place] [address]    Report Status PENDING  Incomplete  Culture, blood (routine x 2)     Status: None (Preliminary result)   Collection Time: 01/23/19 12:23 PM  Result Value Ref Range Status   Specimen Description BLOOD RIGHT ARM LOWER  Final   Special Requests   Final    BOTTLES DRAWN AEROBIC ONLY Blood Culture adequate volume   Culture   Final    NO GROWTH < 24 HOURS Performed at [facility], [address] [place], [place] [address]    Report Status PENDING  Incomplete     Labs: Basic Metabolic Panel: Recent Labs  Lab  01/21/19 1809 01/22/19 0420 01/22/19 1832 01/23/19 0549 01/24/19 0223  NA 138 137 137 137 139  K 4.7 5.1 4.8 4.4 3.9  CL 113* 110 109 104 107  CO2 17* 15* 20* 22 22  GLUCOSE 96 101* 114* 111* 121*  BUN 11 6 12 15 15   CREATININE 1.14* 1.21* 1.28* 1.22* 1.19*  CALCIUM 8.0* 8.6* 8.7* 9.2 8.4*  MG  --  2.1  --  2.0  --    Liver Function Tests: Recent Labs  Lab 01/21/19 1809 01/22/19 0420 01/22/19 1832 01/23/19 0549 01/24/19 0223  AST 197* 137* 103* 96* 97*  ALT 269* 202* 156* 142* 117*  ALKPHOS 201* 176* 163* 165* 164*  BILITOT 6.6* 5.3* 4.4* 4.1* 3.4*  PROT 5.5* 4.9* 4.8* 5.1* 4.6*  ALBUMIN 2.5* 2.0* 1.9* 2.0* 1.9*   No results for input(s): LIPASE, AMYLASE in the last 168 hours. No results for input(s): AMMONIA in the last 168 hours. CBC: Recent Labs  Lab 01/21/19 1035  01/22/19 0420 01/22/19 1832 01/23/19 0549 01/23/19 1502 01/24/19 1029  WBC 14.5*   < > 25.4* 32.9* 40.1* 39.4* 41.5*  NEUTROABS 10.0*  --   --   --   --  32.7* 28.2*  HGB 13.6   < > 10.0* 9.2* 9.2* 8.0* 8.8*  HCT 40.9   < > 31.0* 29.4* 27.9* 25.0* 27.6*  MCV 105.4*   < >  108.4* 108.1* 107.3* 108.2* 108.2*  PLT 224   < > 180 210 219 192 205   < > = values in this interval not displayed.   Cardiac Enzymes: No results for input(s): CKTOTAL, CKMB, CKMBINDEX, TROPONINI in the last 168 hours. BNP: BNP (last 3 results) No results for input(s): BNP in the last 8760 hours.  ProBNP (last 3 results) No results for input(s): PROBNP in the last 8760 hours.  CBG: Recent Labs  Lab 01/23/19 2149 01/24/19 0443 01/24/19 0632 01/24/19 0701 01/24/19 1134  GLUCAP 116* 78 69* 106* 123*       Signed:  Tamina Cyphers  [facility] 01/24/2019, 2:24 PM

## 2019-01-27 LAB — FOLATE RBC
FOLATE, RBC: 2527 ng/mL (ref >498)
Folate, Hemolysate: 619 ng/mL
Hematocrit: 24.5 % — ABNORMAL LOW (ref 34.0–46.6)

## 2019-01-28 ENCOUNTER — Ambulatory Visit (HOSPITAL_COMMUNITY): Payer: Medicaid Other

## 2019-01-28 ENCOUNTER — Encounter: Payer: Medicaid Other | Admitting: Obstetrics and Gynecology

## 2019-01-28 ENCOUNTER — Ambulatory Visit (HOSPITAL_COMMUNITY): Admission: RE | Admit: 2019-01-28 | Payer: Medicaid Other | Source: Ambulatory Visit

## 2019-01-28 LAB — CULTURE, BLOOD (ROUTINE X 2)
Culture: NO GROWTH
Culture: NO GROWTH
Special Requests: ADEQUATE
Special Requests: ADEQUATE

## 2019-01-31 ENCOUNTER — Encounter: Payer: Self-pay | Admitting: Obstetrics and Gynecology

## 2019-01-31 ENCOUNTER — Ambulatory Visit (INDEPENDENT_AMBULATORY_CARE_PROVIDER_SITE_OTHER): Payer: Medicaid Other | Admitting: Obstetrics and Gynecology

## 2019-01-31 DIAGNOSIS — R05 Cough: Secondary | ICD-10-CM

## 2019-01-31 DIAGNOSIS — R740 Nonspecific elevation of levels of transaminase and lactic acid dehydrogenase [LDH]: Secondary | ICD-10-CM

## 2019-01-31 DIAGNOSIS — N179 Acute kidney failure, unspecified: Secondary | ICD-10-CM

## 2019-01-31 DIAGNOSIS — R059 Cough, unspecified: Secondary | ICD-10-CM

## 2019-01-31 DIAGNOSIS — R7401 Elevation of levels of liver transaminase levels: Secondary | ICD-10-CM

## 2019-01-31 NOTE — Progress Notes (Signed)
Obstetrics and Gynecology Visit Return Patient Evaluation  Appointment Date: 01/31/2019  Primary Care Provider: Willodean Rosenthal  OBGYN Clinic: [facility]  Chief Complaint: early PP follow up  History of Present Illness:  Lauraine Pi Matto is a 38 y.o. s/p 2/4 pLTCS for twins and ?evolving HELLP vs acute fatty liver. Pt discharged on 2/7 from [facility]. Patient transferred immediately post op  Review of Systems:  as noted in the History of Present Illness.   Medications:  Shyan P. Kappes had no medications administered during this visit. Current Outpatient Medications  Medication Sig Dispense Refill  . oxyCODONE (OXY IR/ROXICODONE) 5 MG immediate release tablet Take 1-2 tablets (5-10 mg total) by mouth every 6 (six) hours as needed for moderate pain. 30 tablet 0  . Prenat w/o A Vit-FeFum-FePo-FA (FOLIVANE-OB) 130-92.4-1 MG CAPS Take 1 tablet by mouth daily. 60 capsule 6  . glucose blood (ACCU-CHEK GUIDE) test strip Use as instructed QID (Patient not taking: Reported on 01/31/2019) 100 each 12   No current facility-administered medications for this visit.     Allergies: has No Known Allergies.  Physical Exam:  BP 98/67   Pulse 80   Wt 120 lb (54.4 kg)   LMP 05/04/2018 (Within Days)   BMI 23.44 kg/m  Body mass index is 23.44 kg/m. General appearance: Well nourished, well developed female in no acute distress.  Abdomen: diffusely non tender to palpation, non distended, and no masses, hernias. C/d/i incision. Steri strips removed  Neuro/Psych:  Normal mood and affect.    Assessment: pt doing well  Plan:  1. Postpartum care and examination Routine care. F/u re: BC nv. Pt notes occasional cough but otherwise negative ROS. Consider cxr if still persists at pp visit.  - CMP and Liver - CBC  2. AKI (acute kidney injury) (HCC) Rpt labs today - CMP and Liver - CBC  3. Elevated transaminase level - CMP and Liver - CBC  Interpreter used  RTC: PRN  Cornelia Copa MD Attending Center for [employer] [facility])

## 2019-02-01 LAB — CMP AND LIVER
ALT: 43 IU/L — ABNORMAL HIGH (ref 0–32)
AST: 31 IU/L (ref 0–40)
Albumin: 3.5 g/dL — ABNORMAL LOW (ref 3.8–4.8)
Alkaline Phosphatase: 143 IU/L — ABNORMAL HIGH (ref 39–117)
BUN: 9 mg/dL (ref 6–20)
Bilirubin Total: 1.1 mg/dL (ref 0.0–1.2)
Bilirubin, Direct: 0.65 mg/dL — ABNORMAL HIGH (ref 0.00–0.40)
CO2: 23 mmol/L (ref 20–29)
Calcium: 9.3 mg/dL (ref 8.7–10.2)
Chloride: 102 mmol/L (ref 96–106)
Creatinine, Ser: 0.9 mg/dL (ref 0.57–1.00)
GFR calc Af Amer: 95 mL/min/{1.73_m2} (ref >59)
GFR calc non Af Amer: 83 mL/min/{1.73_m2} (ref >59)
Glucose: 69 mg/dL (ref 65–99)
POTASSIUM: 4.2 mmol/L (ref 3.5–5.2)
SODIUM: 139 mmol/L (ref 134–144)
Total Protein: 6.4 g/dL (ref 6.0–8.5)

## 2019-02-01 LAB — CBC
Hematocrit: 30.4 % — ABNORMAL LOW (ref 34.0–46.6)
Hemoglobin: 10 g/dL — ABNORMAL LOW (ref 11.1–15.9)
MCH: 34.7 pg — ABNORMAL HIGH (ref 26.6–33.0)
MCHC: 32.9 g/dL (ref 31.5–35.7)
MCV: 106 fL — ABNORMAL HIGH (ref 79–97)
Platelets: 535 10*3/uL — ABNORMAL HIGH (ref 150–450)
RBC: 2.88 x10E6/uL — ABNORMAL LOW (ref 3.77–5.28)
RDW: 13.1 % (ref 11.7–15.4)
WBC: 13.9 10*3/uL — ABNORMAL HIGH (ref 3.4–10.8)

## 2019-02-17 ENCOUNTER — Ambulatory Visit: Payer: Medicaid Other | Admitting: Family Medicine

## 2019-02-17 ENCOUNTER — Other Ambulatory Visit: Payer: Medicaid Other

## 2019-03-03 ENCOUNTER — Telehealth: Payer: Self-pay | Admitting: Family Medicine

## 2019-03-03 NOTE — Telephone Encounter (Signed)
Called patient with burmese interpreter to inform her of the office restrictions due to the coronavirus. E-visit information also given if needing to be seen while sick and cannot come to scheduled appointment. Patient verbalized understanding. Appointment rescheduled due to child having an appointment

## 2019-03-04 ENCOUNTER — Ambulatory Visit: Payer: Medicaid Other | Admitting: Obstetrics & Gynecology

## 2019-03-04 ENCOUNTER — Other Ambulatory Visit: Payer: Self-pay | Admitting: *Deleted

## 2019-03-04 ENCOUNTER — Other Ambulatory Visit: Payer: Medicaid Other

## 2019-03-04 DIAGNOSIS — O24439 Gestational diabetes mellitus in the puerperium, unspecified control: Secondary | ICD-10-CM

## 2019-03-05 ENCOUNTER — Encounter: Payer: Self-pay | Admitting: Obstetrics & Gynecology

## 2019-03-05 ENCOUNTER — Ambulatory Visit (INDEPENDENT_AMBULATORY_CARE_PROVIDER_SITE_OTHER): Payer: Medicaid Other | Admitting: Obstetrics & Gynecology

## 2019-03-05 ENCOUNTER — Other Ambulatory Visit: Payer: Self-pay

## 2019-03-05 ENCOUNTER — Other Ambulatory Visit: Payer: Medicaid Other

## 2019-03-05 VITALS — BP 110/67 | HR 93 | Wt 127.5 lb

## 2019-03-05 DIAGNOSIS — M545 Low back pain, unspecified: Secondary | ICD-10-CM

## 2019-03-05 DIAGNOSIS — R740 Nonspecific elevation of levels of transaminase and lactic acid dehydrogenase [LDH]: Secondary | ICD-10-CM

## 2019-03-05 DIAGNOSIS — Z1389 Encounter for screening for other disorder: Secondary | ICD-10-CM | POA: Diagnosis not present

## 2019-03-05 DIAGNOSIS — Z8632 Personal history of gestational diabetes: Secondary | ICD-10-CM

## 2019-03-05 DIAGNOSIS — O24439 Gestational diabetes mellitus in the puerperium, unspecified control: Secondary | ICD-10-CM | POA: Diagnosis not present

## 2019-03-05 DIAGNOSIS — R7401 Elevation of levels of liver transaminase levels: Secondary | ICD-10-CM

## 2019-03-05 MED ORDER — IBUPROFEN 600 MG PO TABS: 600.0000 mg | ORAL_TABLET | Freq: Four times a day (QID) | ORAL | 2 refills | Status: DC | PRN

## 2019-03-05 NOTE — Progress Notes (Signed)
   POSTPARTUM VISIT  Subjective:     Teresa Clark is a 38 y.o. [id number] female who presents for a postpartum visit. She is 5 weeks postpartum following a primary low transverse cesarean section. I have fully reviewed the prenatal and intrapartum course; she had GDM also had Influenza. The delivery was at 36.3 gestational weeks due elevated LFTs, AKI in setting of Influenza A; immediate delivery recommended by MFM.  Anesthesia: spinal. Postpartum course has been unremarkable. Babies' courses have been unremarkable; feeding by bottle - Similac with Iron. Bleeding no bleeding. Bowel function is normal. Bladder function is normal. Patient is not sexually active. She reports some pain around spinal site, no problems with headaches.  Contraception method is condoms. Postpartum depression screening: negative.  The following portions of the patient's history were reviewed and updated as appropriate: allergies, current medications, past family history, past medical history, past social history, past surgical history and problem list. Normal pap with negative HPV in 2017.  Review of Systems Pertinent items noted in HPI and remainder of comprehensive ROS otherwise negative.   Objective:    BP 110/67   Pulse 93   Wt 127 lb 8 oz (57.8 kg)   LMP 05/04/2018 (Within Days)   BMI 24.90 kg/m   General:  alert and no distress   Breasts:  Deferred  Lungs: clear to auscultation bilaterally  Heart:  regular rate and rhythm  Abdomen: soft, non-tender; bowel sounds normal; no masses,  no organomegaly. Incision is healing well.   Pelvic:  not evaluated        Assessment:   Normal postpartum exam.   Plan:     1. History of gestational diabetes mellitus (GDM) Postpartum 2 hr GTT done  2. Elevated transaminase level - Comprehensive metabolic panel rechecked today  3. Back pain at L4-L5 level Recommended Ibuprofen as needed. If worsens, will have come back for evaluation.  - ibuprofen (ADVIL,MOTRIN) 600 MG  tablet; Take 1 tablet (600 mg total) by mouth every 6 (six) hours as needed for headache, mild pain, moderate pain or cramping.  Dispense: 60 tablet; Refill: 2    Jaynie Collins, MD, FACOG Obstetrician & Gynecologist, [facility] for [employer], [facility]   .

## 2019-03-06 ENCOUNTER — Encounter: Payer: Self-pay | Admitting: Obstetrics & Gynecology

## 2019-03-06 LAB — COMPREHENSIVE METABOLIC PANEL
A/G RATIO: 1.8 (ref 1.2–2.2)
ALT: 61 IU/L — ABNORMAL HIGH (ref 0–32)
AST: 38 IU/L (ref 0–40)
Albumin: 4.8 g/dL (ref 3.8–4.8)
Alkaline Phosphatase: 82 IU/L (ref 39–117)
BUN/Creatinine Ratio: 13 (ref 9–23)
BUN: 11 mg/dL (ref 6–20)
Bilirubin Total: 0.5 mg/dL (ref 0.0–1.2)
CALCIUM: 10.9 mg/dL — AB (ref 8.7–10.2)
CO2: 23 mmol/L (ref 20–29)
Chloride: 102 mmol/L (ref 96–106)
Creatinine, Ser: 0.86 mg/dL (ref 0.57–1.00)
GFR calc Af Amer: 101 mL/min/{1.73_m2} (ref >59)
GFR, EST NON AFRICAN AMERICAN: 87 mL/min/{1.73_m2} (ref >59)
Globulin, Total: 2.7 g/dL (ref 1.5–4.5)
Glucose: 75 mg/dL (ref 65–99)
Potassium: 4.2 mmol/L (ref 3.5–5.2)
Sodium: 141 mmol/L (ref 134–144)
Total Protein: 7.5 g/dL (ref 6.0–8.5)

## 2019-03-06 LAB — GLUCOSE TOLERANCE, 2 HOURS
Glucose, 2 hour: 74 mg/dL (ref 65–139)
Glucose, GTT - Fasting: 92 mg/dL (ref 65–99)

## 2019-04-02 ENCOUNTER — Other Ambulatory Visit: Payer: Medicaid Other

## 2019-12-30 IMAGING — US US OB COMP LESS 14 WK
1 series · 15 of 21 positions shown · non-contrast
Comparison: None.

CLINICAL DATA: Dating, viability

EXAM:
TWIN OBSTETRICAL ULTRASOUND <14 WKS

[Series 1: us ob comp less 14 wk · 21 acquisitions, 15 frames shown]
[im 1/21]
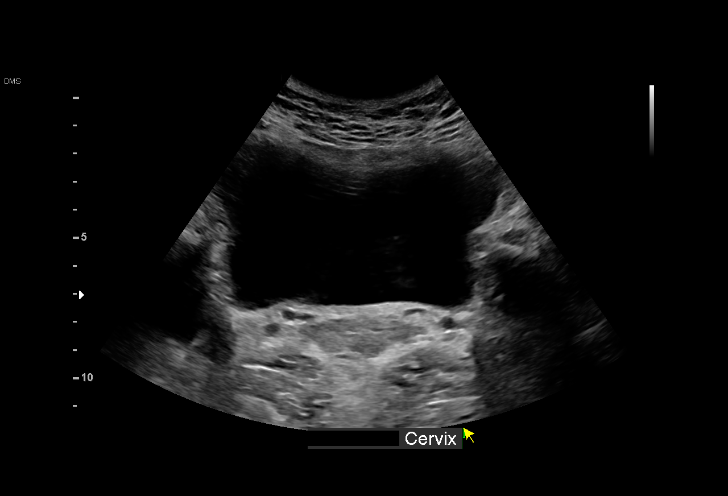
[im 3/21]
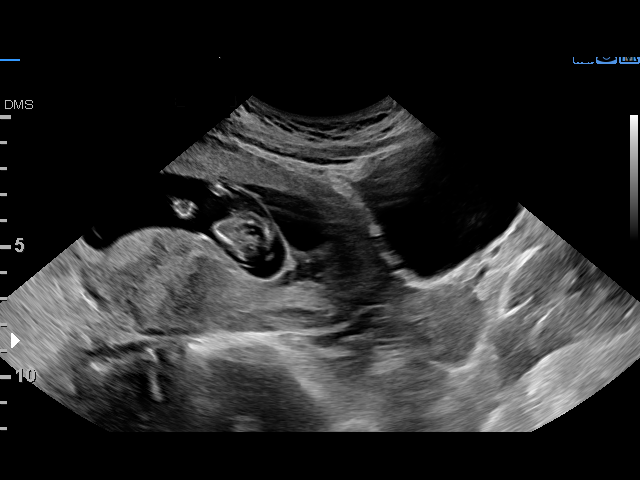
[im 4/21]
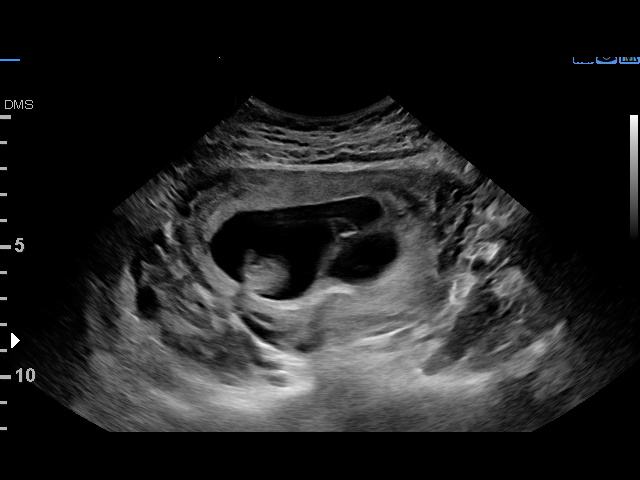
[im 5/21]
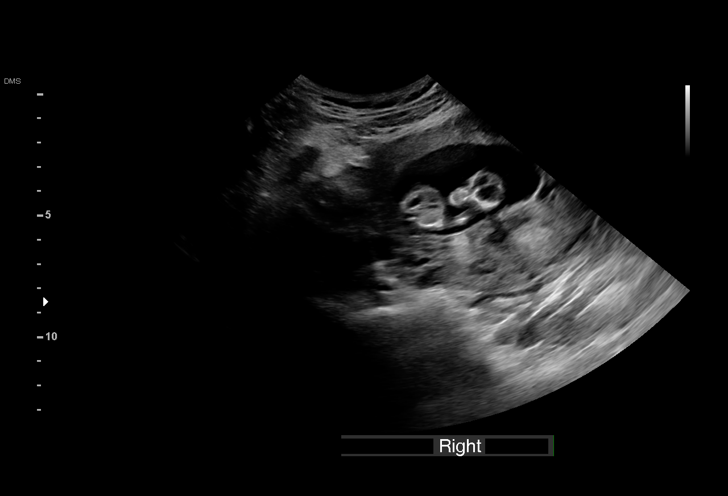
[im 7/21]
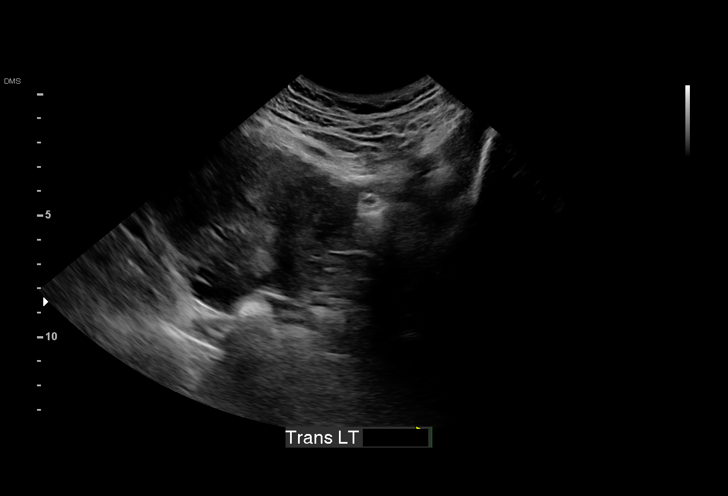
[im 8/21]
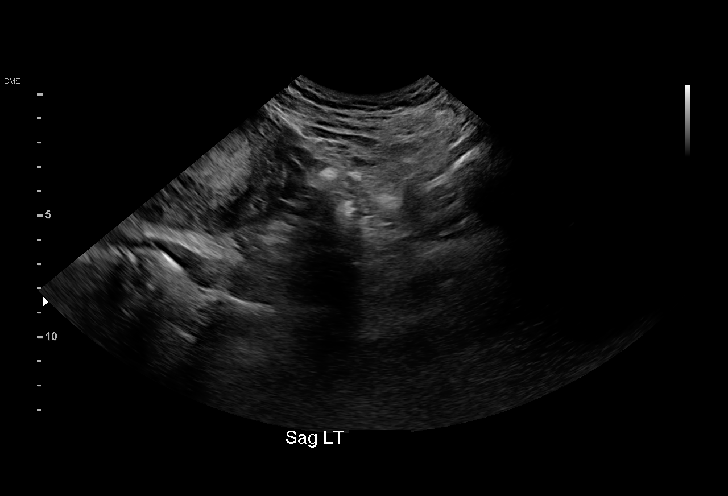
[im 10/21]
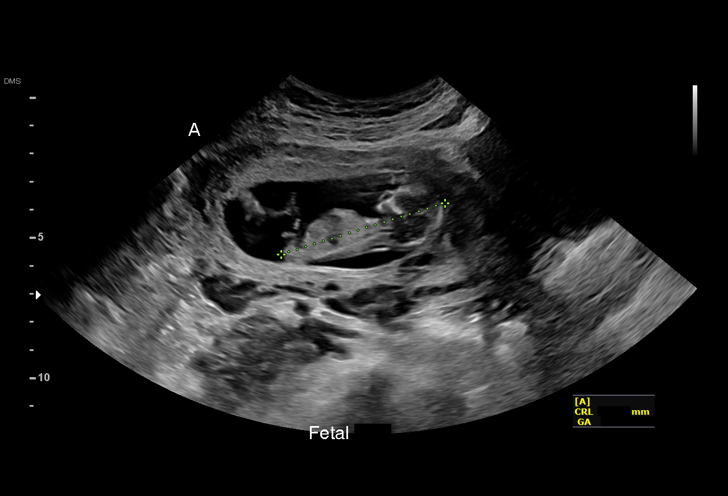
[im 11/21]
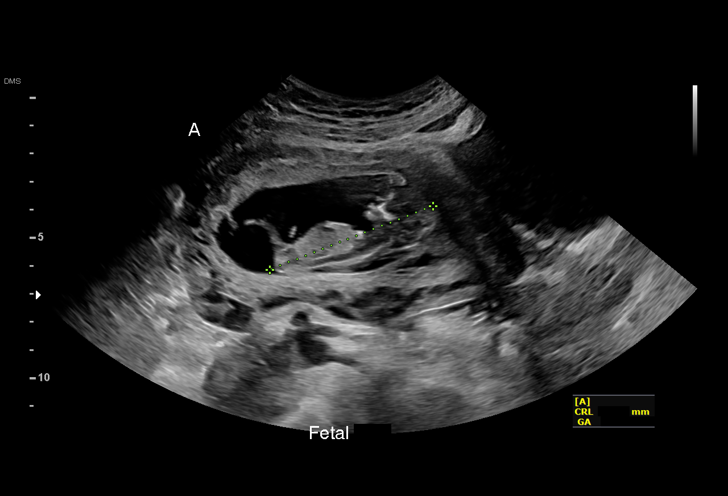
[im 12/21]
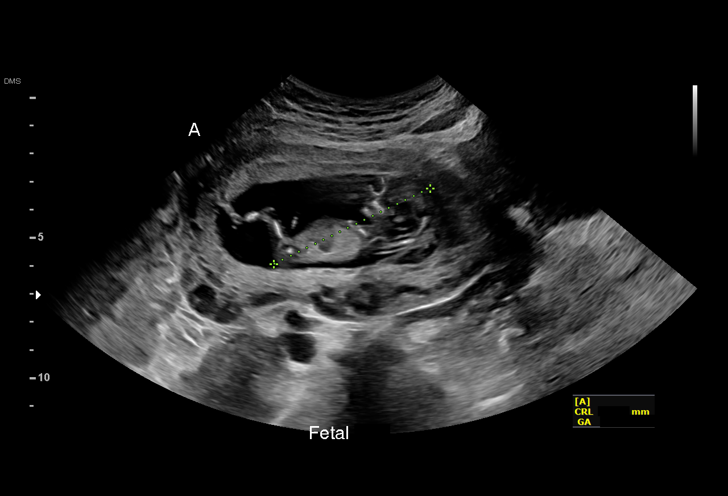
[im 14/21]
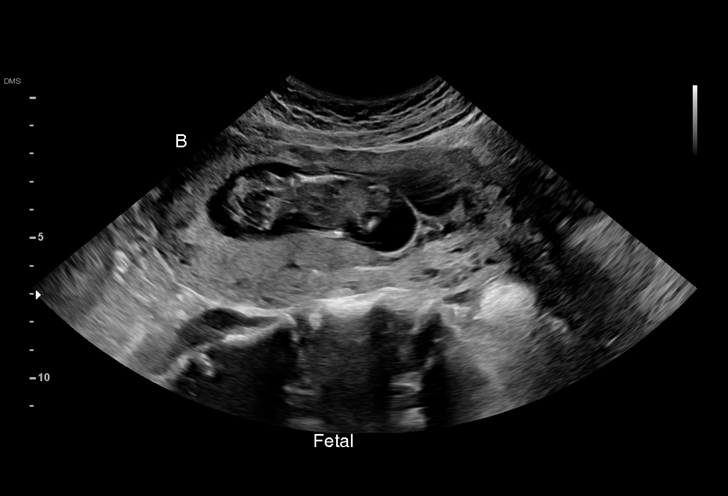
[im 15/21]
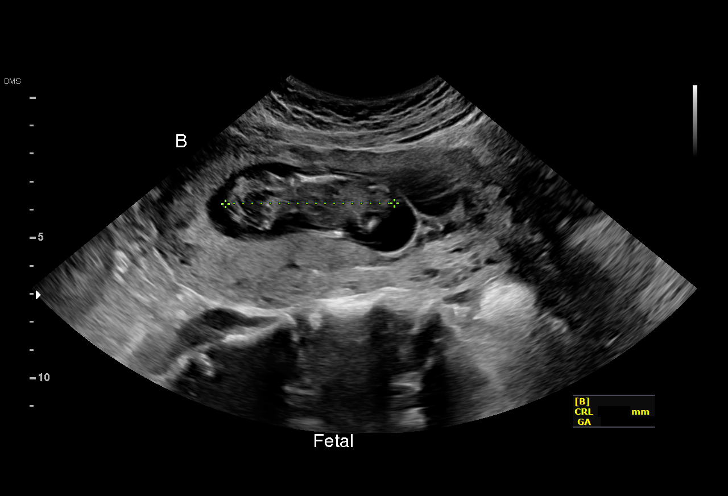
[im 17/21]
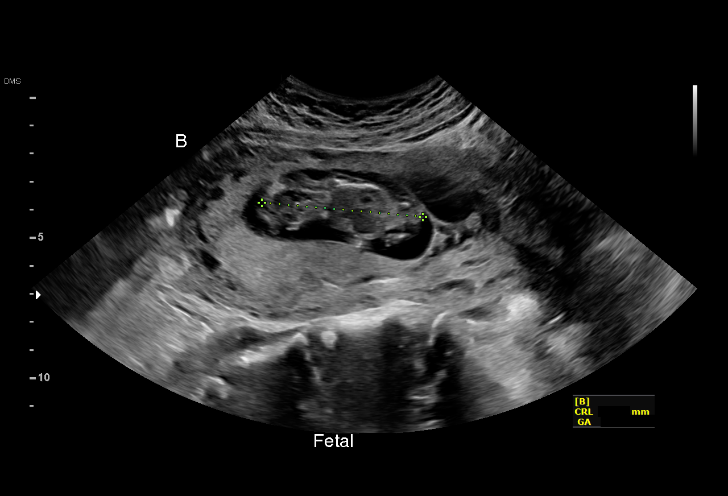
[im 18/21]
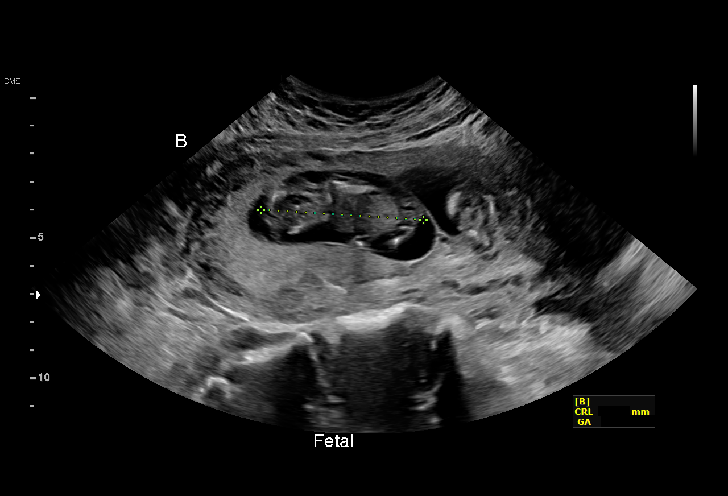
[im 19/21]
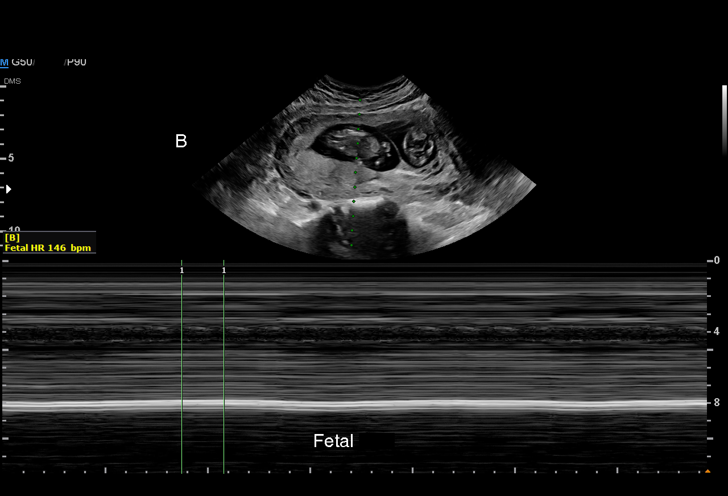
[im 21/21]
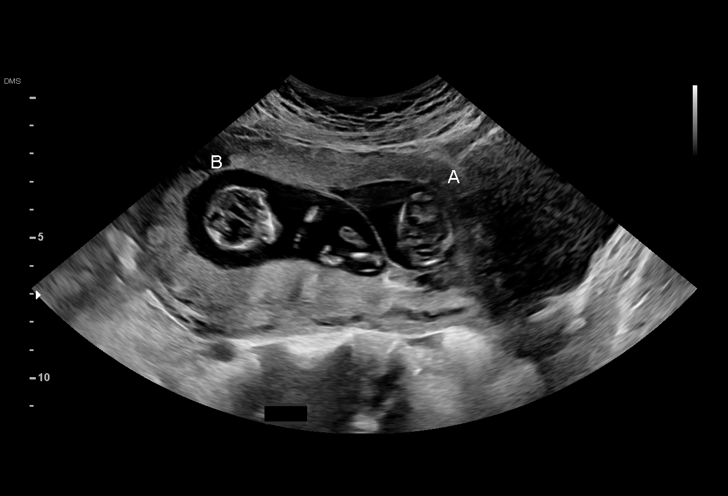

[15 of 21 positions shown; findings below may reference images not displayed]

FINDINGS: Number of IUPs:  2

Chorionicity/Amnionicity:  Dichorionic diamniotic

TWIN 1

Yolk sac:  Not visualized

Embryo:  Visualized

Cardiac Activity: Visualized

Heart Rate: 169 bpm

MSD:   mm    w     d

CRL:   61.7 mm   12 w 4 d                  US EDC: 02/15/2019

TWIN 2

Yolk sac:  Not visualized

Embryo:  Visualized

Cardiac Activity: Visualized

Heart Rate: 160 bpm

MSD:   mm    w     d

CRL:   58.5 mm   12 w 2 d                  US EDC: 02/17/2019

Subchorionic hemorrhage:  None visualized.

Maternal uterus/adnexae: No adnexal mass or free fluid.
IMPRESSION: Dichorionic, diamniotic twin gestation with approximate age of 12
weeks 4 days. No acute maternal findings.

## 2020-02-08 IMAGING — US US MFM OB DETAIL+14 WK
1 series · 16 of 28 positions shown · non-contrast
Comparison: none

[Series 1: us mfm ob detail+14 wk · 16 of 146 slices shown]
[im 1/146]
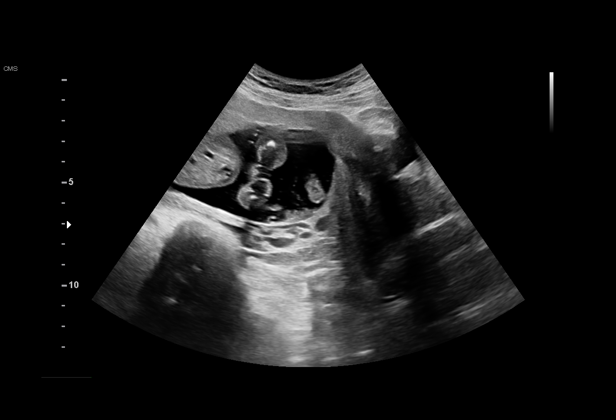
[im 11/146]
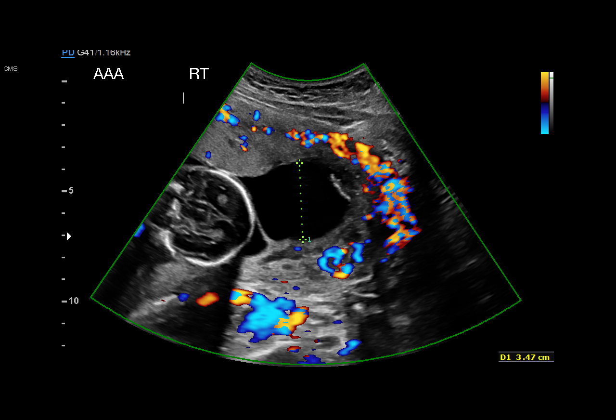
[im 22/146]
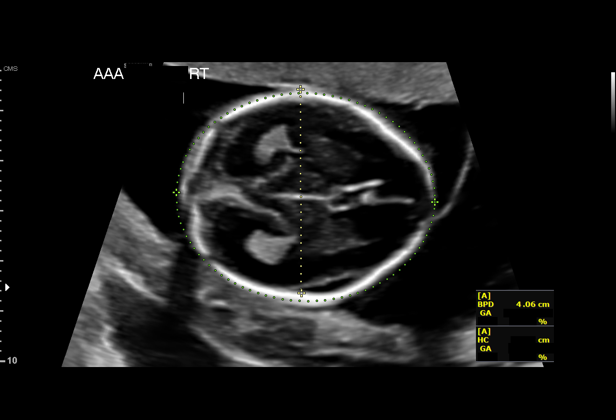
[im 33/146]
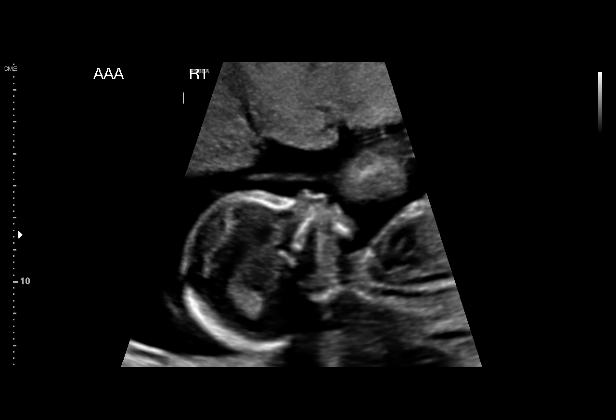
[im 38/146]
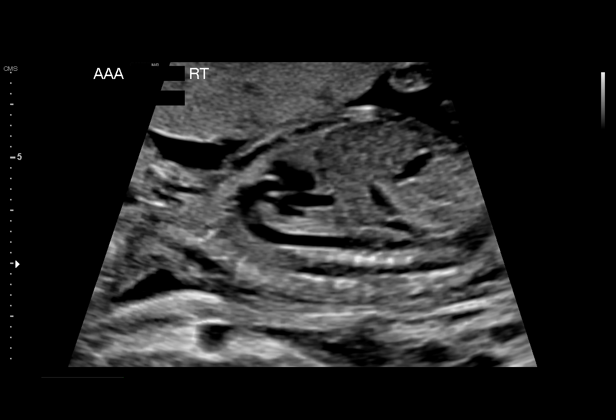
[im 49/146]
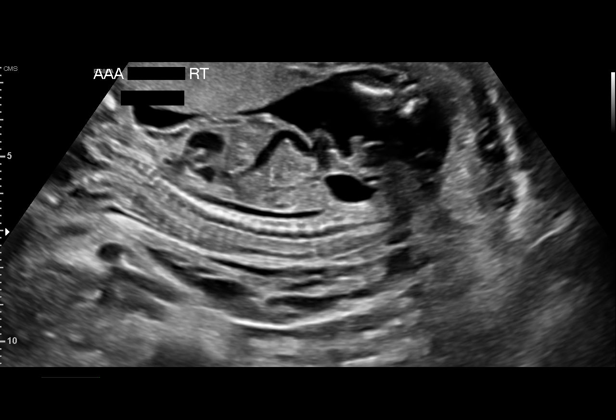
[im 60/146]
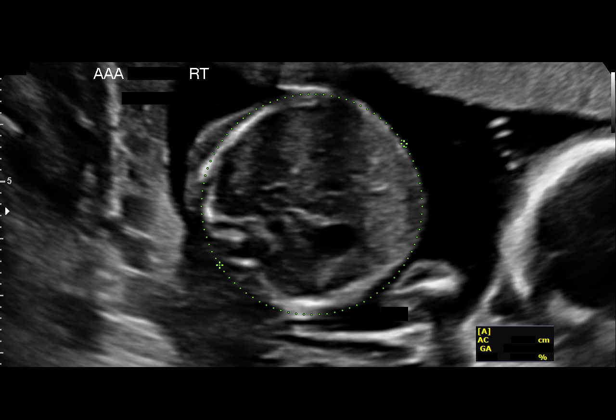
[im 70/146]
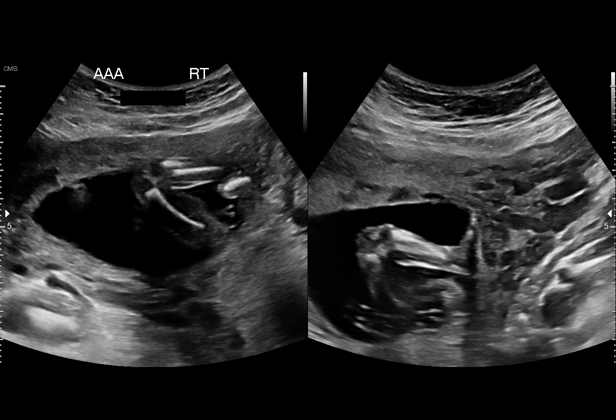
[im 76/146]
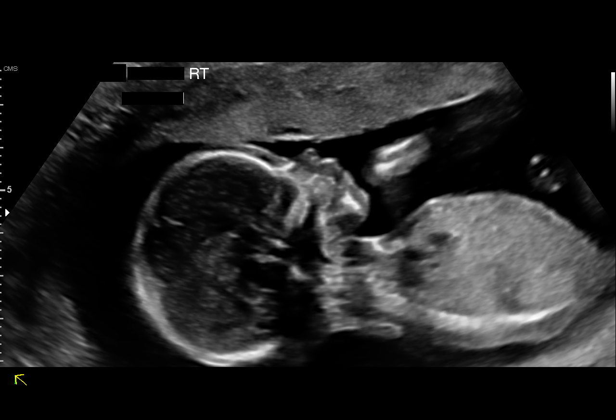
[im 86/146]
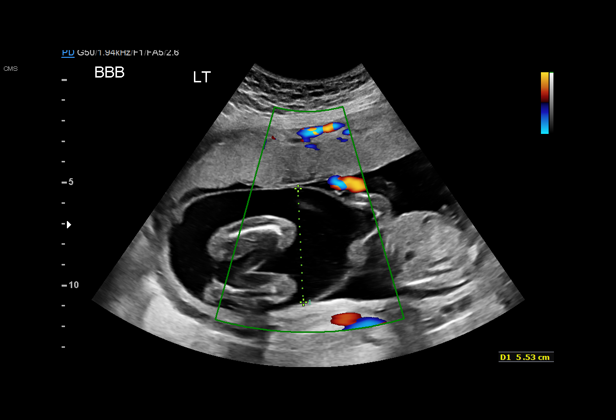
[im 97/146]
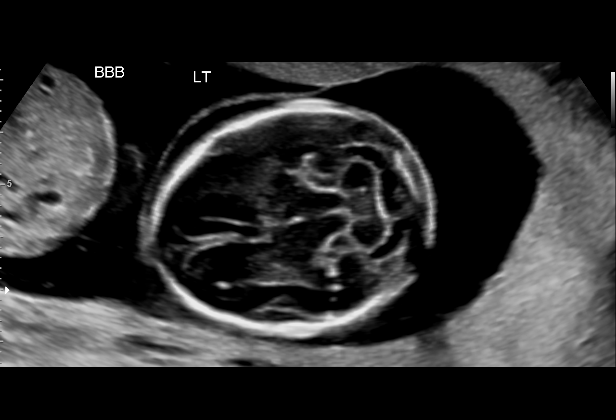
[im 108/146]
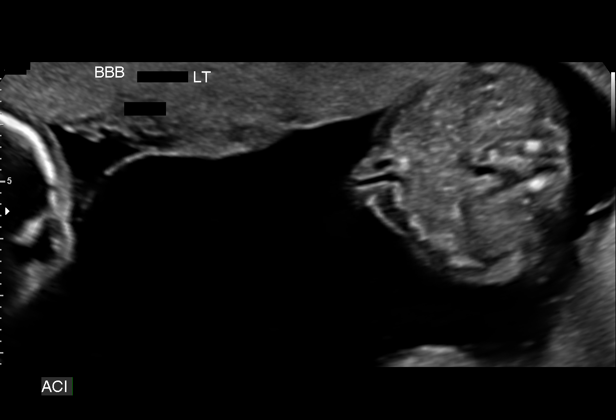
[im 113/146]
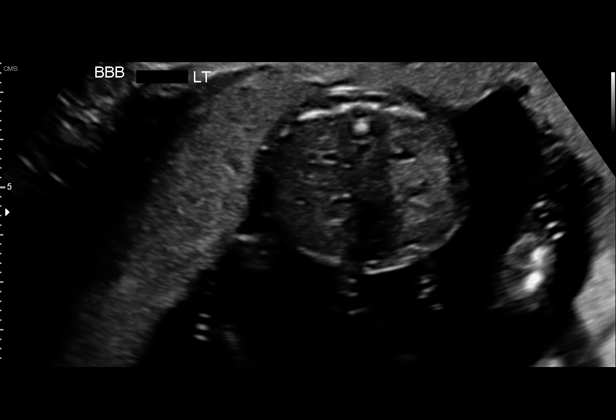
[im 124/146]
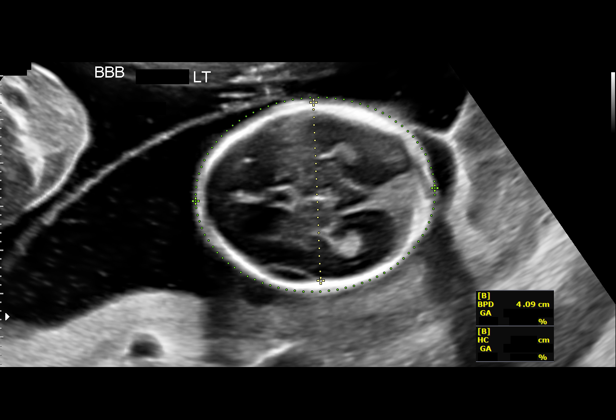
[im 135/146]
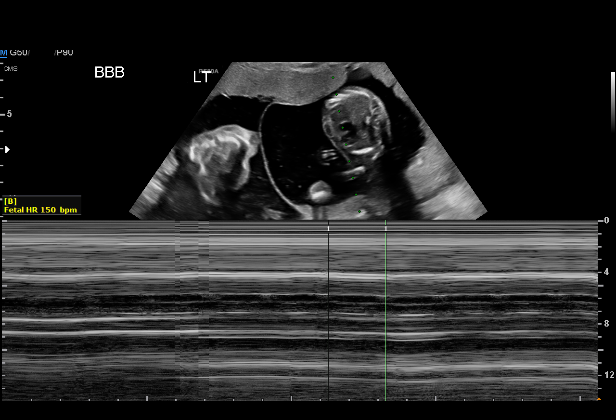
[im 146/146]
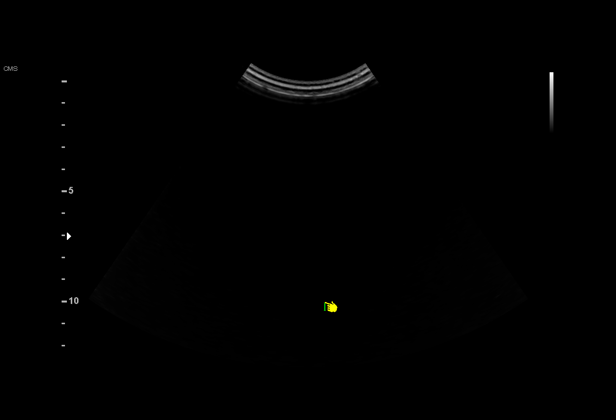

[16 of 28 positions shown; findings below may reference images not displayed]

OB/Gyn Clinic

1  US MFM OB DETAIL ADDL GEST           76811.02     ARIS SOWERS
+14 WK

Indications

Encounter for antenatal screening for
malformations
Twin pregnancy, di/di, second trimester
Advanced maternal age multigravida 35+,
second trimester
18 weeks gestation of pregnancy
Fetal Evaluation (Fetus A)

Num Of Fetuses:         2
Fetal Heart Rate(bpm):  150
Cardiac Activity:       Observed
Fetal Lie:              Upper Right Fetus
Presentation:           Breech
Placenta:               Anterior
P. Cord Insertion:      Visualized
Membrane Desc:      Dividing Membrane seen - Dichorionic.

Amniotic Fluid
AFI FV:      Within normal limits

Largest Pocket(cm)
3.5
Biometry (Fetus A)

BPD:      40.4  mm     G. Age:  18w 2d         49  %    CI:        75.62   %    70 - 86
FL/HC:      16.4   %    15.8 - 18
HC:      147.3  mm     G. Age:  17w 6d         23  %    HC/AC:      1.19        1.07 -
AC:      124.2  mm     G. Age:  18w 0d         39  %    FL/BPD:     59.7   %
FL:       24.1  mm     G. Age:  17w 2d         13  %    FL/AC:      19.4   %    20 - 24
HUM:      24.1  mm     G. Age:  17w 3d         30  %
CER:      17.9  mm     G. Age:  17w 6d         37  %
NFT:       4.2  mm
CM:        3.1  mm

Est. FW:     206  gm      0 lb 7 oz     35  %     FW Discordancy        17  %
OB History

Gravidity:    4         Term:   3        Prem:   0        SAB:   0
TOP:          0       Ectopic:  0        Living: 3
Gestational Age (Fetus A)

LMP:           19w 2d        Date:  05/04/18                 EDD:   02/08/19
U/S Today:     17w 6d                                        EDD:   02/18/19
Best:          18w 2d     Det. By:  Early Ultrasound         EDD:   02/15/19
(08/07/18)
Anatomy (Fetus A)

Cranium:               Appears normal         LVOT:                   Appears normal
Cavum:                 Appears normal         Aortic Arch:            Appears normal
Ventricles:            Appears normal         Ductal Arch:            Appears normal
Choroid Plexus:        Appears normal         Diaphragm:              Appears normal
Cerebellum:            Appears normal         Stomach:                Appears normal, left
sided
Posterior Fossa:       Appears normal         Abdomen:                Appears normal
Nuchal Fold:           Appears normal         Abdominal Wall:         Appears nml (cord
insert, abd wall)
Face:                  Appears normal         Cord Vessels:           Appears normal (3
(orbits and profile)                           vessel cord)
Lips:                  Appears normal         Kidneys:                Appear normal
Palate:                Appears normal         Bladder:                Appears normal
Thoracic:              Appears normal         Spine:                  Not well visualized
Heart:                 Appears normal         Upper Extremities:      Appears normal
(4CH, axis, and
situs)
RVOT:                  Not well visualized    Lower Extremities:      Appears normal

Other:  Heels visualized. Nasal bone visualized. Technically difficult due to
fetal position.

Fetal Evaluation (Fetus B)

Num Of Fetuses:         2
Fetal Heart Rate(bpm):  150
Cardiac Activity:       Observed
Fetal Lie:              Lower Left Fetus
Presentation:           Cephalic
Placenta:               Left lateral
P. Cord Insertion:      Visualized
Membrane Desc:      Dividing Membrane seen - Dichorionic.

Amniotic Fluid
AFI FV:      Within normal limits

Largest Pocket(cm)
5.5
Biometry (Fetus B)

BPD:        41  mm     G. Age:  18w 3d         57  %    CI:        68.82   %    70 - 86
FL/HC:      16.9   %    15.8 - 18
HC:      157.9  mm     G. Age:  18w 5d         61  %    HC/AC:      1.17        1.07 -
AC:       135   mm     G. Age:  19w 0d         69  %    FL/BPD:     65.1   %
FL:       26.7  mm     G. Age:  18w 1d         39  %    FL/AC:      19.8   %    20 - 24
HUM:        26  mm     G. Age:  18w 1d         52  %
CER:      18.1  mm     G. Age:  18w 0d         41  %
NFT:       2.7  mm

CM:        5.8  mm

Est. FW:     247  gm      0 lb 9 oz     53  %     FW Discordancy     0 \ 17 %
Gestational Age (Fetus B)

LMP:           19w 2d        Date:  05/04/18                 EDD:   02/08/19
U/S Today:     18w 4d                                        EDD:   02/13/19
Best:          18w 2d     Det. By:  Early Ultrasound         EDD:   02/15/19
(08/07/18)
Anatomy (Fetus B)

Cranium:               Appears normal         LVOT:                   Appears normal
Cavum:                 Appears normal         Aortic Arch:            Appears normal
Ventricles:            Appears normal         Ductal Arch:            Not well visualized
Choroid Plexus:        Appears normal         Diaphragm:              Appears normal
Cerebellum:            Appears normal         Stomach:                Appears normal, left
sided
Posterior Fossa:       Appears normal         Abdomen:                Appears normal
Nuchal Fold:           Appears normal         Abdominal Wall:         Appears nml (cord
insert, abd wall)
Face:                  Orbits nl; profile not Cord Vessels:           Appears normal (3
well visualized                                vessel cord)
Lips:                  Appears normal         Kidneys:                Appear normal
Palate:                Not well visualized    Bladder:                Appears normal
Thoracic:              Appears normal         Spine:                  Appears normal
Heart:                 Not well visualized    Upper Extremities:      Appears normal
RVOT:                  Not well visualized    Lower Extremities:      Appears normal
Cervix Uterus Adnexa

Cervix
Length:            3.5  cm.
Normal appearance by transabdominal scan.

Uterus
No abnormality visualized.

Left Ovary
Within normal limits.
Right Ovary
Within normal limits.

Cul De Sac
No free fluid seen.

Adnexa
No abnormality visualized.
Impression

Dichorionic-diamniotic twin pregnancy.

Patient is here for fetal anatomy scan. She had declined
aneuploidy screening.

I reviewed the images of ultrasound performed in the first
trimester. The CRL measurements performed at the [facility] seems more accurate and I recommend we assign
EDD and her gestational age based on that ultrasound.

Assigned EDD: 02/15/2019.

Twin A: Upper right fetus, breech, anterior placenta, male
fetus. Amniotic fluid is normal and good fetal activity is seen.
Fetal biometry is consistent with the established dates. No
markers of aneuploidies or fetal structural defects are seen.

Twin B: Lower left fetus, cephalic, left lateral placenta, male
fetus. Amniotic fluid is normal and good fetal activity is seen.
Fetal biometry is consistent with the established dates. No
markers of aneuploidies or fetal structural defects are seen.

Anatomical survey could not be completed because of fetal
position and early gestational age (see above).

Recommendations

-An appointment was made for her to return in 4 weeks for
completion of fetal anatomy.
-Serial fetal growth assessments every 4 weeks.

## 2020-03-14 IMAGING — US US MFM OB FOLLOW-UP EACH ADDL GEST (MODIFY)
1 series · 12 of 28 positions shown · non-contrast
Comparison: none

[Series 1: us mfm ob follow-up each addl gest (modify) · 12 of 92 slices shown]
[im 4/92]
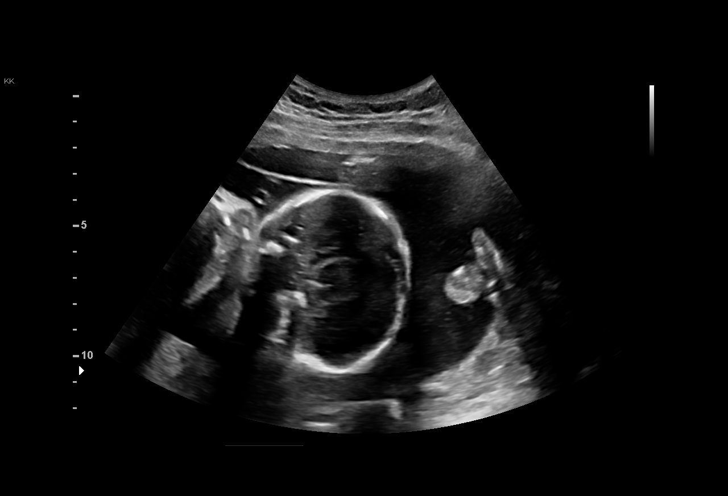
[im 11/92]
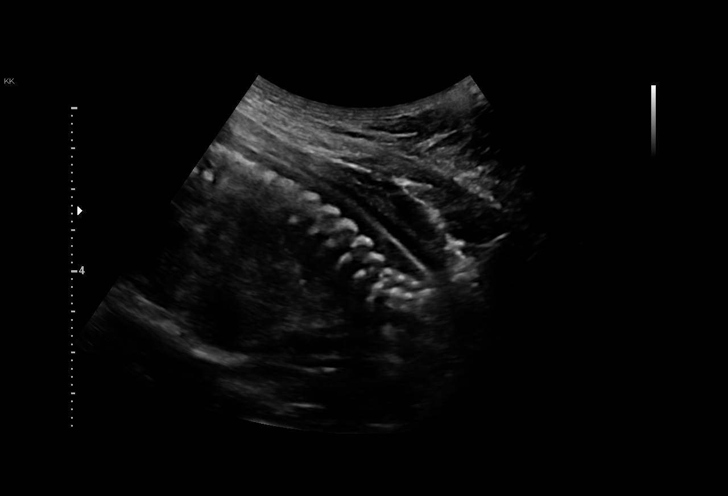
[im 17/92]
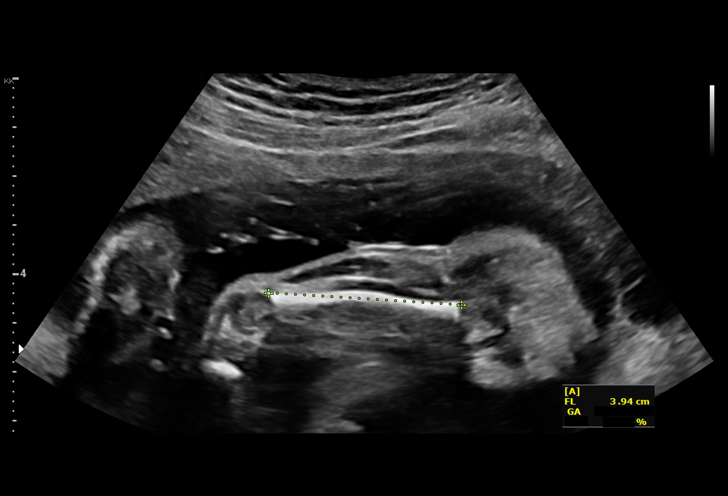
[im 27/92]
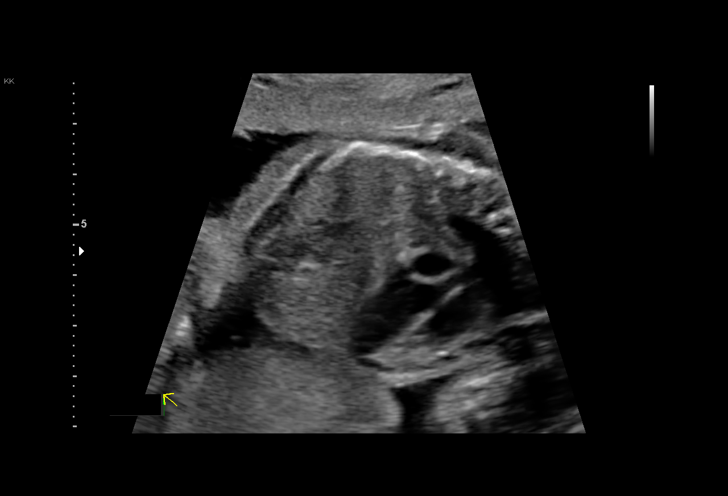
[im 34/92]
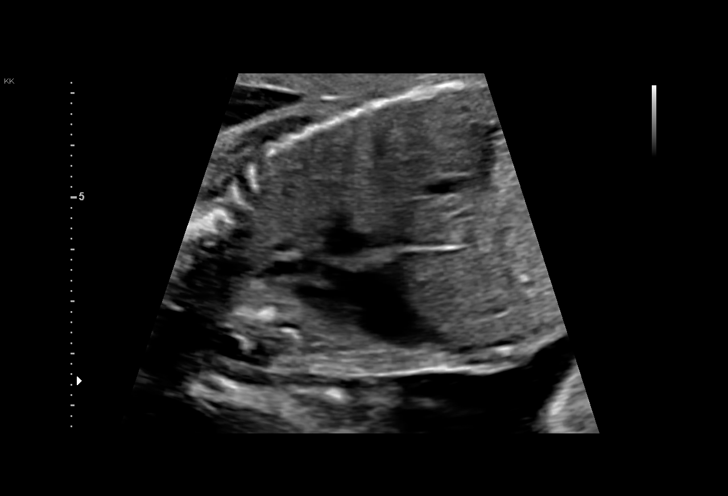
[im 41/92]
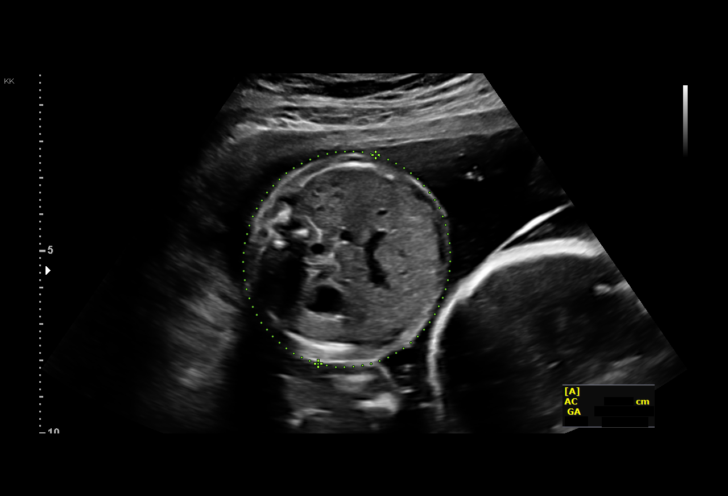
[im 51/92]
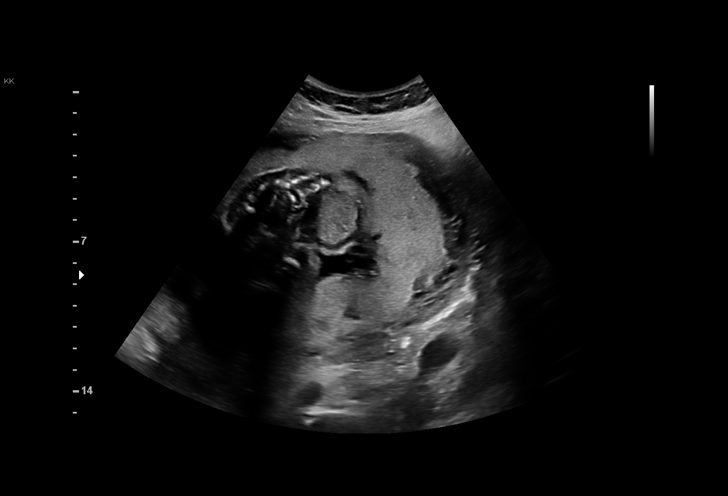
[im 58/92]
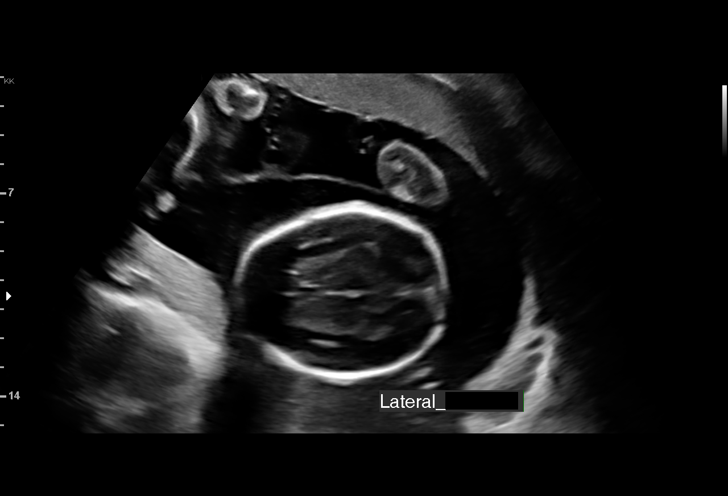
[im 65/92]
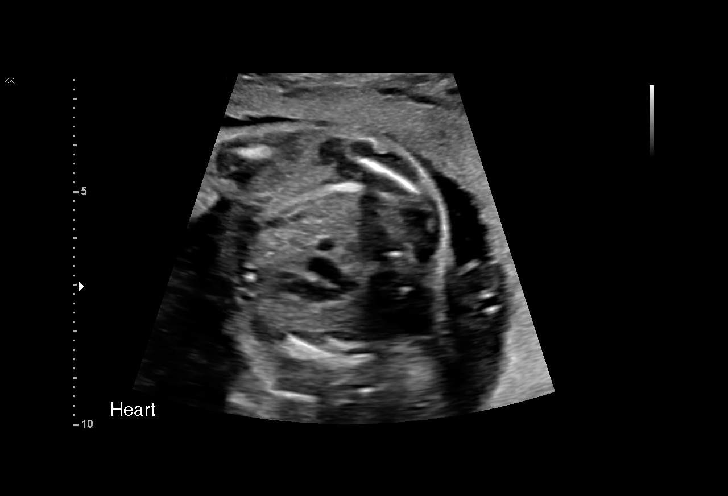
[im 75/92]
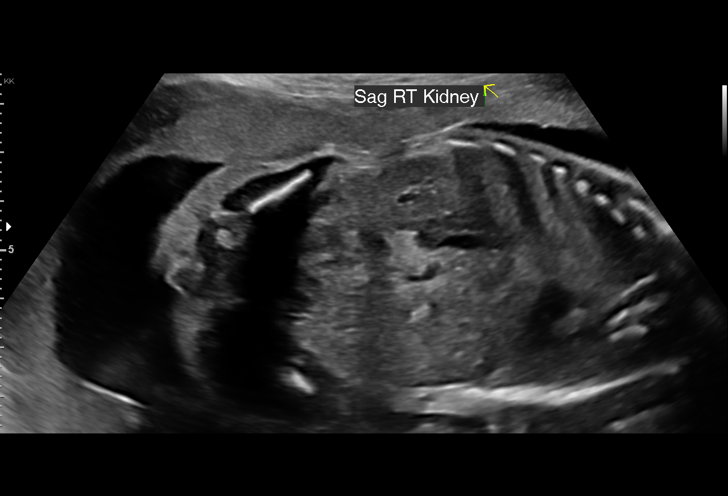
[im 81/92]
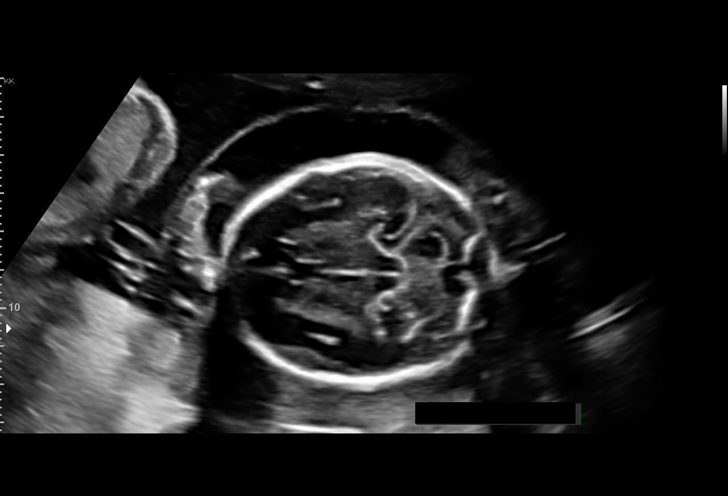
[im 88/92]
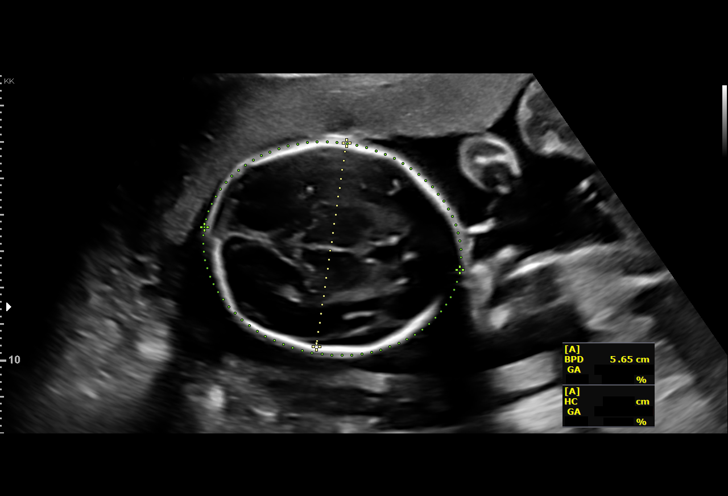

[12 of 28 positions shown; findings below may reference images not displayed]

OB/Gyn Clinic

     GEST
 ----------------------------------------------------------------------

 ----------------------------------------------------------------------
Indications

  23 weeks gestation of pregnancy
  Twin pregnancy, di/di, second trimester
  Advanced maternal age multigravida 35+,
  second trimester
  Antenatal follow-up for nonvisualized fetal
  anatomy
 ----------------------------------------------------------------------
Fetal Evaluation (Fetus A)

 Num Of Fetuses:          2
 Fetal Heart Rate(bpm):   143
 Cardiac Activity:        Observed
 Fetal Lie:               Maternal right side
 Presentation:            Breech
 Placenta:                Anterior
 P. Cord Insertion:       Previously Visualized
 Membrane Desc:      Dividing Membrane seen - Dichorionic.

 Amniotic Fluid
 AFI FV:      Within normal limits

                             Largest Pocket(cm)

Biometry (Fetus A)

 BPD:      56.6  mm     G. Age:  23w 2d         45  %    CI:        77.58   %    70 - 86
                                                         FL/HC:       19.7  %    19.2 -
 HC:      203.4  mm     G. Age:  22w 3d         10  %    HC/AC:       1.12       1.05 -
 AC:      181.7  mm     G. Age:  23w 0d         33  %    FL/BPD:      70.7  %    71 - 87
 FL:         40  mm     G. Age:  22w 6d         27  %    FL/AC:       22.0  %    20 - 24

 Est. FW:     547   gm     1 lb 3 oz     46  %     FW Discordancy        12  %
OB History

 Gravidity:    4         Term:   3        Prem:   0        SAB:   0
 TOP:          0       Ectopic:  0        Living: 3
Gestational Age (Fetus A)

 LMP:           24w 2d        Date:  05/04/18                 EDD:   02/08/19
 U/S Today:     22w 6d                                        EDD:   02/18/19
 Best:          23w 2d     Det. By:  Early Ultrasound         EDD:   02/15/19
                                     (08/07/18)
Anatomy (Fetus A)

 Cranium:               Appears normal         LVOT:                   Appears normal
 Cavum:                 Appears normal         Aortic Arch:            Previously seen
 Ventricles:            Appears normal         Ductal Arch:            Previously seen
 Choroid Plexus:        Previously seen        Diaphragm:              Appears normal
 Cerebellum:            Appears normal         Stomach:                Appears normal, left
                                                                       sided
 Posterior Fossa:       Previously seen        Abdomen:                Appears normal
 Nuchal Fold:           Previously seen        Abdominal Wall:         Previously seen
 Face:                  Orbits and profile     Cord Vessels:           Previously seen
                        previously seen
 Lips:                  Previously seen        Kidneys:                Appear normal
 Palate:                Previously seen        Bladder:                Appears normal
 Thoracic:              Appears normal         Spine:                  Appears normal
 Heart:                 Previously seen        Upper Extremities:      Previously seen
 RVOT:                  Appears normal         Lower Extremities:      Previously seen

 Other:  Heels and Nasal bone previously visualized. Technically difficult due
         to fetal position.

Fetal Evaluation (Fetus B)

 Num Of Fetuses:          2
 Fetal Heart Rate(bpm):   153
 Cardiac Activity:        Observed
 Fetal Lie:               Maternal left side
 Presentation:            Cephalic
 Placenta:                Left lateral
 P. Cord Insertion:       Previously Visualized
 Membrane Desc:      Dividing Membrane seen - Dichorionic.

 Amniotic Fluid
 AFI FV:      Within normal limits

                             Largest Pocket(cm)

Biometry (Fetus B)

 BPD:      57.2  mm     G. Age:  23w 3d         53  %    CI:        74.16   %    70 - 86
                                                         FL/HC:       19.3  %    19.2 -
 HC:      210.9  mm     G. Age:  23w 1d         30  %    HC/AC:       1.08       1.05 -
 AC:      195.6  mm     G. Age:  24w 2d         72  %    FL/BPD:      71.0  %    71 - 87
 FL:       40.6  mm     G. Age:  23w 1d         34  %    FL/AC:       20.8  %    20 - 24

 Est. FW:     620   gm     1 lb 6 oz     59  %     FW Discordancy     0 \ 12 %
Gestational Age (Fetus B)

 LMP:           24w 2d        Date:  05/04/18                 EDD:   02/08/19
 U/S Today:     23w 4d                                        EDD:   02/13/19
 Best:          23w 2d     Det. By:  Early Ultrasound         EDD:   02/15/19
                                     (08/07/18)
Anatomy (Fetus B)

 Cranium:               Appears normal         LVOT:                   Appears normal
 Cavum:                 Appears normal         Aortic Arch:            Appears normal
 Ventricles:            Appears normal         Ductal Arch:            Appears normal
 Choroid Plexus:        Previously seen        Diaphragm:              Appears normal
 Cerebellum:            Previously seen        Stomach:                Appears normal, left
                                                                       sided
 Posterior Fossa:       Previously seen        Abdomen:                Appears normal
 Nuchal Fold:           Previously seen        Abdominal Wall:         Previously seen
 Face:                  Orbits prev.           Cord Vessels:           Previously seen
                        seen,Profile nl
 Lips:                  Previously seen        Kidneys:                Appear normal
 Palate:                Not well visualized    Bladder:                Appears normal
 Thoracic:              Appears normal         Spine:                  Previously seen
 Heart:                 Appears normal         Upper Extremities:      Previously seen
                        (4CH, axis, and situs
 RVOT:                  Appears normal         Lower Extremities:      Previously seen

 Other:  Technically difficult due to fetal position.
Cervix Uterus Adnexa

 Cervix
 Length:              4  cm.
 Normal appearance by transabdominal scan.
Impression

 Normal interval growth.
Recommendations

 Follow up growth in 4 weeks.

## 2020-05-13 ENCOUNTER — Ambulatory Visit: Payer: Medicaid Other | Attending: Internal Medicine

## 2020-05-13 DIAGNOSIS — Z23 Encounter for immunization: Secondary | ICD-10-CM

## 2020-05-13 NOTE — Progress Notes (Signed)
   Covid-19 Vaccination Clinic  Name:  Ethelwyn Gilbertson Lafferty    MRN: [id number] DOB: Mar 16, 1981  05/13/2020  Ms. Garin was observed post Covid-19 immunization for 15 minutes without incident. She was provided with Vaccine Information Sheet and instruction to access the V-Safe system.   Ms. Portier was instructed to call 911 with any severe reactions post vaccine: Marland Kitchen Difficulty breathing  . Swelling of face and throat  . A fast heartbeat  . A bad rash all over body  . Dizziness and weakness   Immunizations Administered    Name Date Dose VIS Date Route   Pfizer COVID-19 Vaccine 05/13/2020  3:57 PM 0.3 mL 02/11/2019 Intramuscular   Manufacturer: [employer], [employer]   Lot: [id number]   NDC: [address]-0

## 2020-05-31 IMAGING — US US MFM OB FOLLOW-UP EACH ADDL GEST (MODIFY)
1 series · 12 of 28 positions shown · non-contrast
Comparison: none

[Series 1: us mfm ob follow-up each addl gest (modify) · 53 acquisitions, 12 frames shown]
[im 2/53]
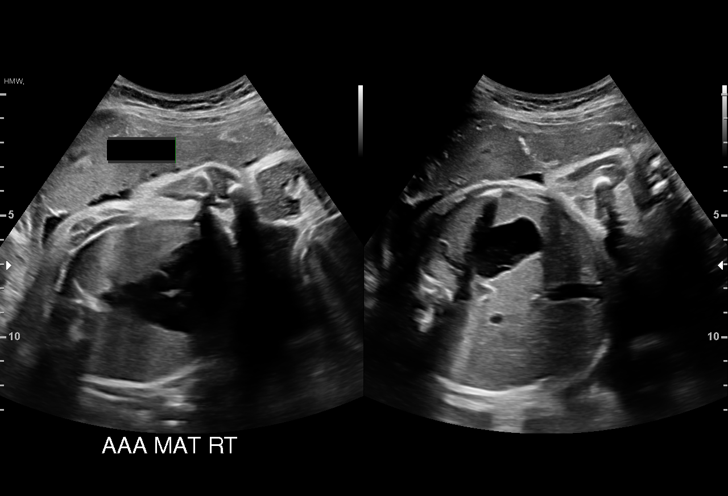
[im 6/53]
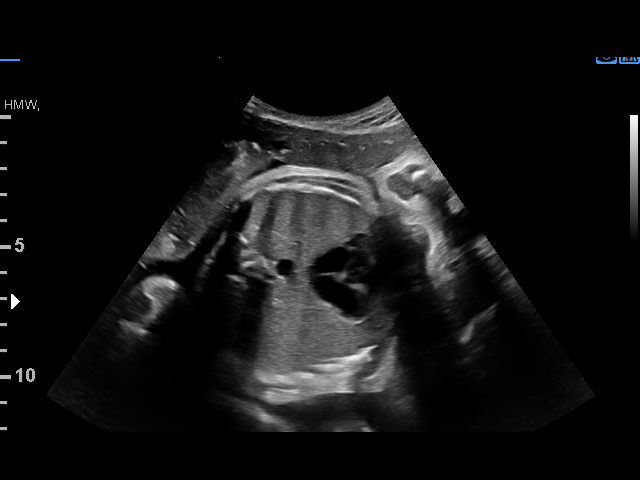
[im 10/53]
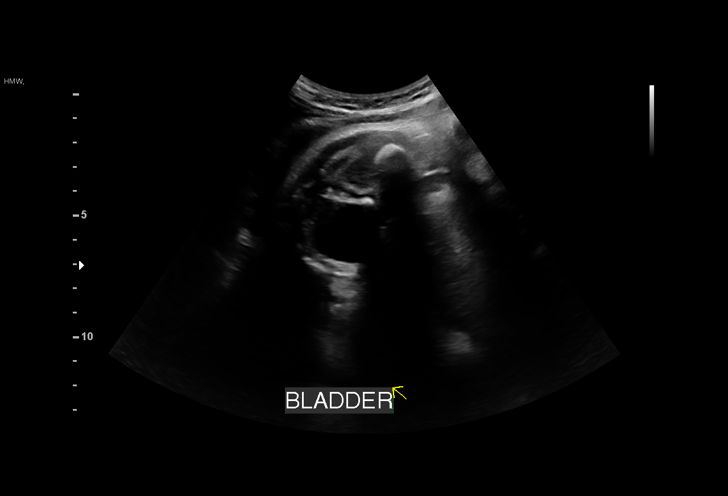
[im 16/53]
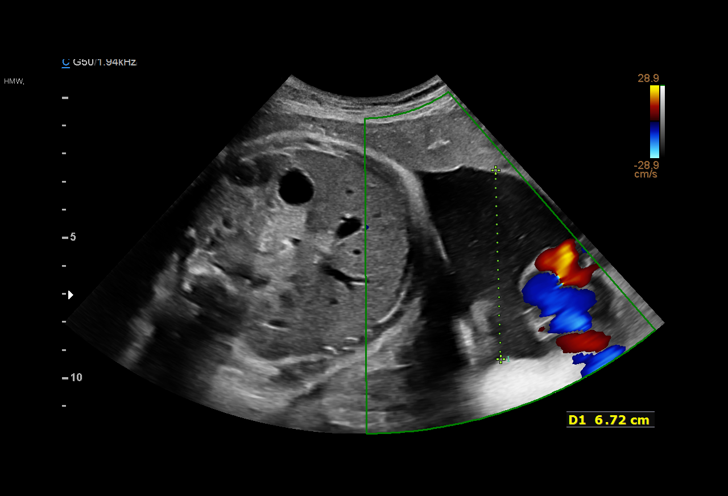
[im 20/53]
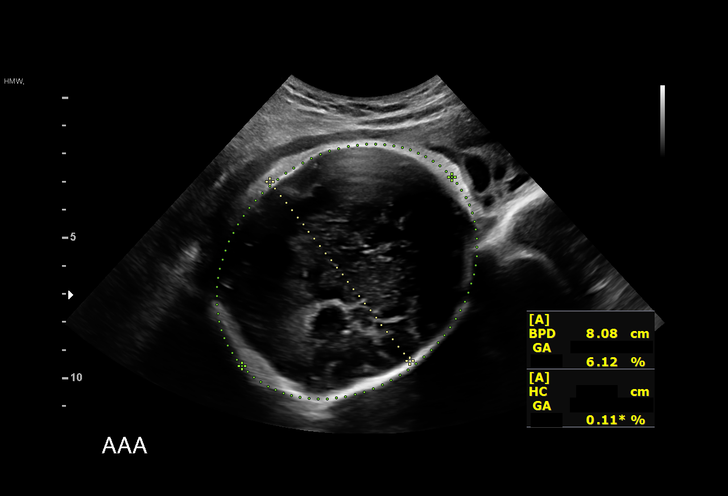
[im 24/53]
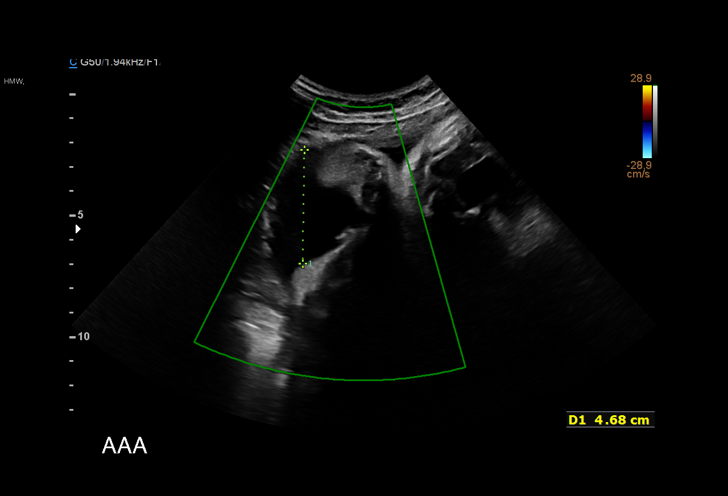
[im 29/53]
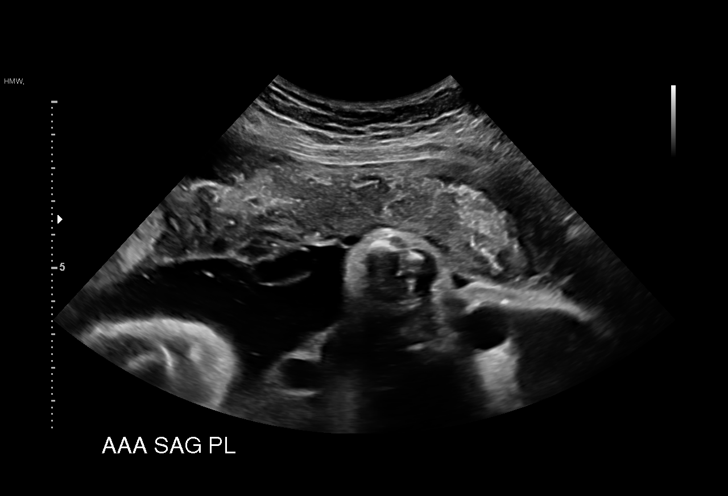
[im 33/53]
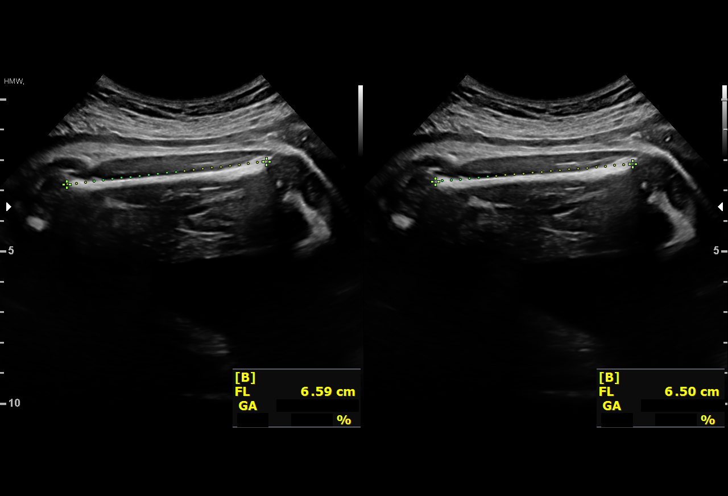
[im 37/53]
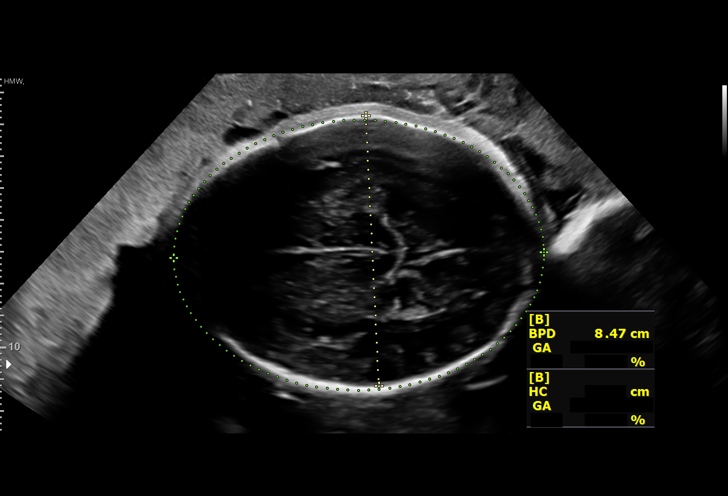
[im 43/53]
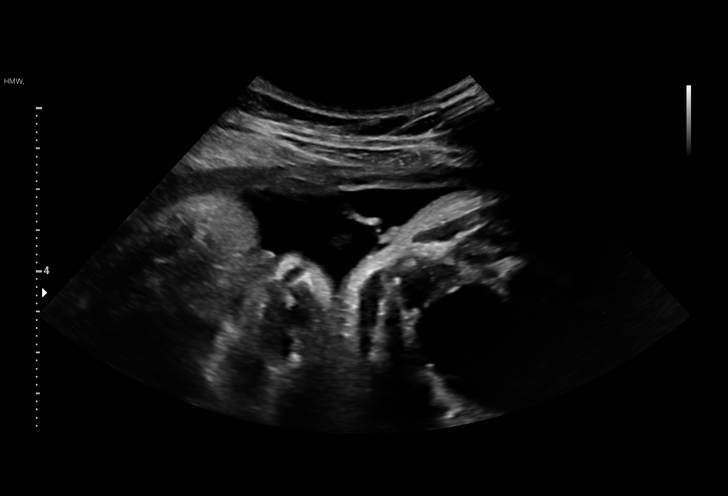
[im 47/53]
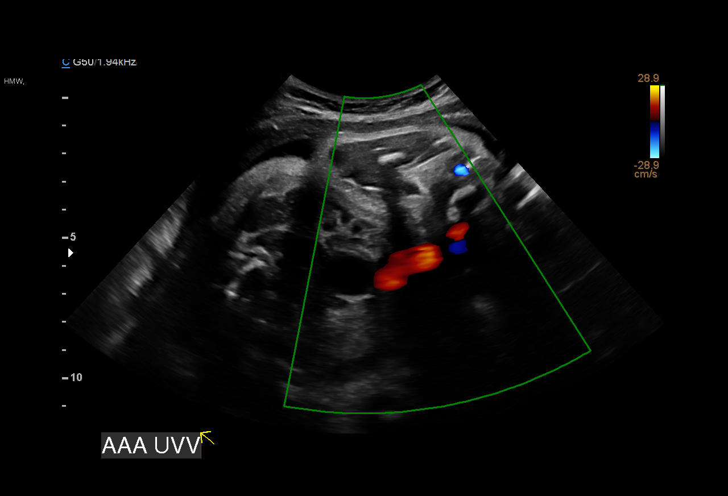
[im 51/53]
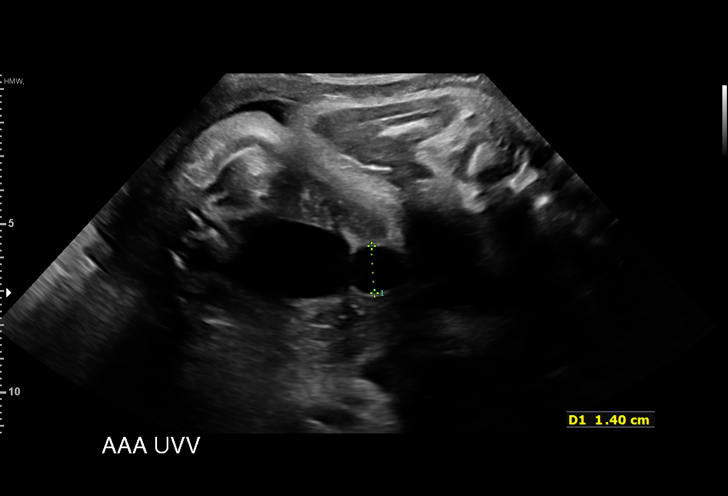

[12 of 28 positions shown; findings below may reference images not displayed]

OB/Gyn Clinic

     GEST
     ADDL GESTATION
 ----------------------------------------------------------------------

 ----------------------------------------------------------------------
Indications

  Advanced maternal age multigravida 35+,
  third trimester
  Gestational diabetes in pregnancy,
  unspecified control (poor control)
  Twin pregnancy, di/di, third trimester
  34 weeks gestation of pregnancy
 ----------------------------------------------------------------------
Vital Signs

                                                Height:        4'11"
Fetal Evaluation (Fetus A)

 Num Of Fetuses:          2
 Fetal Heart              144
 Rate(bpm):
 Cardiac Activity:        Observed
 Fetal Lie:               Maternal right side
 Presentation:            Cephalic
 Placenta:                Anterior
 P. Cord Insertion:       Previously Visualized
 Membrane Desc:      Dividing Membrane seen - Dichorionic.

 Amniotic Fluid
 AFI FV:      Within normal limits

                             Largest Pocket(cm)

Biophysical Evaluation (Fetus A)

 Amniotic F.V:   Within normal limits       F. Tone:         Observed
 F. Movement:    Observed                   Score:           [DATE]
 F. Breathing:   Observed
Biometry (Fetus A)

 BPD:      80.8  mm     G. Age:  32w 3d          6  %    CI:         78.87  %    70 - 86
                                                         FL/HC:       21.4  %    19.4 -
 HC:      287.7  mm     G. Age:  31w 4d        < 3  %    HC/AC:       0.98       0.96 -
 AC:      292.5  mm     G. Age:  33w 2d         22  %    FL/BPD:      76.1  %    71 - 87
 FL:       61.5  mm     G. Age:  31w 6d        < 3  %    FL/AC:       21.0  %    20 - 24

 Est. FW:    8607   g      4 lb 7 oz     26  %     FW Discordancy        17   %
                    m
OB History

 Gravidity:    4         Term:   3        Prem:   0         SAB:   0
 TOP:          0       Ectopic:  0        Living: 3
Gestational Age (Fetus A)

 LMP:           35w 3d        Date:  05/04/18                 EDD:    02/08/19
 U/S Today:     32w 2d                                        EDD:    03/02/19
 Best:          34w 3d     Det. By:  Early Ultrasound         EDD:    02/15/19
                                     (08/07/18)
Anatomy (Fetus A)

 Cranium:               Appears normal         Aortic Arch:            Previously seen
 Cavum:                 Previously seen        Ductal Arch:            Previously seen
 Ventricles:            Previously seen        Diaphragm:              Appears normal
 Choroid Plexus:        Previously seen        Stomach:                Appears normal,
                                                                       left sided
 Cerebellum:            Previously seen        Abdomen:                Previously seen
 Posterior Fossa:       Previously seen        Abdominal Wall:         UVV 1.4 cm
 Nuchal Fold:           Previously seen        Cord Vessels:           Previously seen
 Face:                  Orbits and profile     Kidneys:                Appear normal
                        previously seen
 Lips:                  Previously seen        Bladder:                Appears normal
 Thoracic:              Appears normal         Spine:                  Previously seen
 Heart:                 Previously seen        Upper Extremities:      Previously seen
 RVOT:                  Previously seen        Lower Extremities:      Previously seen
 LVOT:                  Previously seen

 Other:  Heels and Nasal bone previously visualized. Technically difficult due
         to fetal position.
Fetal Evaluation (Fetus B)

 Num Of Fetuses:          2
 Fetal Heart              136
 Rate(bpm):
 Cardiac Activity:        Observed
 Fetal Lie:               Maternal left side
 Presentation:            Cephalic
 Placenta:                Left lateral
 P. Cord Insertion:       Previously Visualized
 Membrane Desc:      Dividing Membrane seen - Dichorionic.

 Amniotic Fluid
 AFI FV:      Within normal limits

                             Largest Pocket(cm)

Biophysical Evaluation (Fetus B)

 Amniotic F.V:   Within normal limits       F. Tone:         Observed
 F. Movement:    Observed                   Score:           [DATE]
 F. Breathing:   Observed
Biometry (Fetus B)

 BPD:      84.7  mm     G. Age:  34w 1d         40  %    CI:         71.52  %    70 - 86
                                                         FL/HC:       20.4  %    19.4 -
 HC:      318.9  mm     G. Age:  35w 6d         52  %    HC/AC:       1.04       0.96 -
 AC:      306.2  mm     G. Age:  34w 4d         59  %    FL/BPD:      76.7  %    71 - 87
 FL:         65  mm     G. Age:  33w 4d         20  %    FL/AC:       21.2  %    20 - 24

 Est. FW:    5991   g      5 lb 5 oz     59  %     FW Discordancy     0 \ 17  %
                    m
Gestational Age (Fetus B)

 LMP:           35w 3d        Date:  05/04/18                 EDD:    02/08/19
 U/S Today:     34w 4d                                        EDD:    02/14/19
 Best:          34w 3d     Det. By:  Early Ultrasound         EDD:    02/15/19
                                     (08/07/18)
Anatomy (Fetus B)

 Cranium:               Appears normal         Aortic Arch:            Previously seen
 Cavum:                 Previously seen        Ductal Arch:            Previously seen
 Ventricles:            Previously seen        Diaphragm:              Previously seen
 Choroid Plexus:        Previously seen        Stomach:                Appears normal,
                                                                       left sided
 Cerebellum:            Previously seen        Abdomen:                Previously seen
 Posterior Fossa:       Previously seen        Abdominal Wall:         Previously seen
 Nuchal Fold:           Previously seen        Cord Vessels:           Previously seen
 Face:                  Orbits and profile     Kidneys:                Appear normal
                        previously seen
 Lips:                  Previously seen        Bladder:                Appears normal
 Thoracic:              Appears normal         Spine:                  Previously seen
 Heart:                 Previously seen        Upper Extremities:      Previously seen
 RVOT:                  Previously seen        Lower Extremities:      Previously seen
 LVOT:                  Previously seen
 Other:  Technically difficult due to fetal position.
Cervix Uterus Adnexa

 Cervix
 Not visualized (advanced GA >60wks)
Impression

 Normal interval growth.
 Diamniotic Dichorionic Twin pregnancy
 Gestational diabetes
Recommendations

 Continue weekly BPP/NST
 Follow upgrowth scheduled in 3 weeks
 Consider delivery between 37-38 weeks.

## 2020-06-07 ENCOUNTER — Ambulatory Visit: Payer: Medicaid Other | Attending: Internal Medicine

## 2020-06-07 DIAGNOSIS — Z23 Encounter for immunization: Secondary | ICD-10-CM

## 2020-06-07 NOTE — Progress Notes (Signed)
   Covid-19 Vaccination Clinic  Name:  Tylar Merendino Aho    MRN: [id number] DOB: 09/11/1981  06/07/2020  Ms. Creed was observed post Covid-19 immunization for 15 minutes without incident. She was provided with Vaccine Information Sheet and instruction to access the V-Safe system.   Ms. Swarey was instructed to call 911 with any severe reactions post vaccine: Marland Kitchen Difficulty breathing  . Swelling of face and throat  . A fast heartbeat  . A bad rash all over body  . Dizziness and weakness   Immunizations Administered    Name Date Dose VIS Date Route   Pfizer COVID-19 Vaccine 06/07/2020  9:40 AM 0.3 mL 02/11/2019 Intramuscular   Manufacturer: [employer], [employer]   Lot: [id number]   NDC: [address]-0

## 2023-02-13 ENCOUNTER — Other Ambulatory Visit: Payer: Self-pay

## 2023-02-13 ENCOUNTER — Ambulatory Visit (INDEPENDENT_AMBULATORY_CARE_PROVIDER_SITE_OTHER): Payer: Medicaid Other | Admitting: Advanced Practice Midwife

## 2023-02-13 VITALS — BP 115/76 | HR 60 | Wt 128.0 lb

## 2023-02-13 DIAGNOSIS — Z3200 Encounter for pregnancy test, result unknown: Secondary | ICD-10-CM

## 2023-02-13 DIAGNOSIS — Z789 Other specified health status: Secondary | ICD-10-CM | POA: Diagnosis not present

## 2023-02-13 DIAGNOSIS — Z3490 Encounter for supervision of normal pregnancy, unspecified, unspecified trimester: Secondary | ICD-10-CM

## 2023-02-13 DIAGNOSIS — Z3201 Encounter for pregnancy test, result positive: Secondary | ICD-10-CM | POA: Diagnosis not present

## 2023-02-13 LAB — POCT PREGNANCY, URINE: Preg Test, Ur: POSITIVE — AB

## 2023-02-13 MED ORDER — PRENATAL PLUS 27-1 MG PO TABS: 1.0000 | ORAL_TABLET | Freq: Every day | ORAL | 11 refills | Status: DC

## 2023-02-13 NOTE — Patient Instructions (Signed)

## 2023-02-13 NOTE — Progress Notes (Signed)
Possible Pregnancy  Here today for pregnancy confirmation. UPT in office today is positive. Pt reports first positive home UPT on 01/30/23. This pregnancy is unplanned, but desired. Reviewed dating with patient:   LMP: 12/29/22 EDD: 10/05/23 6w 4d today  OB history reviewed; vaginal delivery x 3; c-section x 1.  Reviewed medications and allergies with patient; list of medications safe to take during pregnancy given.  Recommended pt begin prenatal vitamin and schedule prenatal care. Marcille Buffy, CNM to bedside for brief visit.  [place] interpreter unavailable. Encounter completed with [employer] [place] interpreter [employer] [phone].  Annabell Howells, RN 02/13/2023  8:41 AM

## 2023-02-15 ENCOUNTER — Encounter: Payer: Self-pay | Admitting: Family Medicine

## 2023-03-14 ENCOUNTER — Ambulatory Visit: Payer: Medicaid Other | Attending: Critical Care Medicine | Admitting: Critical Care Medicine

## 2023-03-14 ENCOUNTER — Encounter: Payer: Self-pay | Admitting: Critical Care Medicine

## 2023-03-14 VITALS — BP 101/72 | HR 73 | Ht 60.0 in | Wt 128.0 lb

## 2023-03-14 DIAGNOSIS — Z3490 Encounter for supervision of normal pregnancy, unspecified, unspecified trimester: Secondary | ICD-10-CM | POA: Insufficient documentation

## 2023-03-14 DIAGNOSIS — Z789 Other specified health status: Secondary | ICD-10-CM | POA: Diagnosis not present

## 2023-03-14 DIAGNOSIS — K029 Dental caries, unspecified: Secondary | ICD-10-CM | POA: Insufficient documentation

## 2023-03-14 DIAGNOSIS — Z8632 Personal history of gestational diabetes: Secondary | ICD-10-CM

## 2023-03-14 DIAGNOSIS — Z139 Encounter for screening, unspecified: Secondary | ICD-10-CM | POA: Insufficient documentation

## 2023-03-14 MED ORDER — PRENATAL PLUS 27-1 MG PO TABS: 1.0000 | ORAL_TABLET | Freq: Every day | ORAL | 11 refills | Status: DC

## 2023-03-14 NOTE — Assessment & Plan Note (Signed)
Referral to dentist was made she has Medicaid

## 2023-03-14 NOTE — Assessment & Plan Note (Signed)
History of gestational diabetes this will need to be screened again will check A1c and lab data

## 2023-03-14 NOTE — Assessment & Plan Note (Signed)
Will in addition to [id number] check metabolic panel and blood counts

## 2023-03-14 NOTE — Assessment & Plan Note (Signed)
The patient is due with her pregnancy in October she did not get the prenatal vitamins we will reissue prescription

## 2023-03-14 NOTE — Progress Notes (Signed)
New Patient Office Visit  Subjective    Patient ID: Teresa Clark, female    DOB: 20-Feb-1981  Age: 42 y.o. MRN: [id number]  CC:  Chief Complaint  Patient presents with   Establish Care    Est care / new pt.  No to flu vax.     HPI Teresa Clark presents to establish care Preg EDD 10/05/23 The patient is Burmese and visit assisted with video interpreter [phone] This patient is 42 years old had twins previously requiring C-section and gestational diabetes occurred.  The patient comes in today to establish care and is pregnant again her due date is October 05, 2023.  Patient is yet to get a Pap smear but likely will get this to gynecology.  She does not drink alcohol or smoke but she does chew Betel nuts On arrival the patient has a blood pressure of 101/72.  She needs lab screenings.  She has no specific complaints.  So far no difficulty with the pregnancy.   Outpatient Encounter Medications as of 03/14/2023  Medication Sig   [DISCONTINUED] prenatal vitamin w/FE, FA (PRENATAL 1 + 1) 27-1 MG TABS tablet Take 1 tablet by mouth daily at 12 noon.   prenatal vitamin w/FE, FA (PRENATAL 1 + 1) 27-1 MG TABS tablet Take 1 tablet by mouth daily at 12 noon.   [DISCONTINUED] Prenat w/o A Vit-FeFum-FePo-FA (FOLIVANE-OB) 130-92.4-1 MG CAPS Take 1 tablet by mouth daily. (Patient not taking: Reported on 03/14/2023)   No facility-administered encounter medications on file as of 03/14/2023.    Past Medical History:  Diagnosis Date   Gestational diabetes    Normal postpartum 2 hr GTT   Influenza B 01/21/2019    Past Surgical History:  Procedure Laterality Date   CESAREAN SECTION MULTI-GESTATIONAL N/A 01/21/2019   Procedure: CESAREAN SECTION MULTI-GESTATIONAL;  Surgeon: Truett Mainland, DO;  Location: [facility];  Service: Obstetrics;  Laterality: N/A;   NO PAST SURGERIES      Family History  Family history unknown: Yes    Social History   Socioeconomic History   Marital status: Married     Spouse name: Not on file   Number of children: 2   Years of education: Not on file   Highest education level: Not on file  Occupational History   Not on file  Tobacco Use   Smoking status: Never   Smokeless tobacco: Never  Vaping Use   Vaping Use: Never used  Substance and Sexual Activity   Alcohol use: Never   Drug use: No   Sexual activity: Never  Other Topics Concern   Not on file  Social History Narrative   Not on file   Social Determinants of Health   Financial Resource Strain: Not on file  Food Insecurity: Not on file  Transportation Needs: Not on file  Physical Activity: Not on file  Stress: Not on file  Social Connections: Not on file  Intimate Partner Violence: Not on file    Review of Systems  Constitutional:  Negative for chills, diaphoresis, fever, malaise/fatigue and weight loss.  HENT:  Negative for congestion, hearing loss, nosebleeds, sore throat and tinnitus.   Eyes:  Negative for blurred vision, photophobia and redness.  Respiratory:  Negative for cough, hemoptysis, sputum production, shortness of breath, wheezing and stridor.   Cardiovascular:  Negative for chest pain, palpitations, orthopnea, claudication, leg swelling and PND.  Gastrointestinal:  Negative for abdominal pain, blood in stool, constipation, diarrhea, heartburn, nausea and vomiting.  Genitourinary:  Negative for dysuria, flank pain, frequency, hematuria and urgency.  Musculoskeletal:  Negative for back pain, falls, joint pain, myalgias and neck pain.  Skin:  Negative for itching and rash.  Neurological:  Negative for dizziness, tingling, tremors, sensory change, speech change, focal weakness, seizures, loss of consciousness, weakness and headaches.  Endo/Heme/Allergies:  Negative for environmental allergies and polydipsia. Does not bruise/bleed easily.  Psychiatric/Behavioral:  Negative for depression, memory loss, substance abuse and suicidal ideas. The patient is not nervous/anxious  and does not have insomnia.         Objective    BP 101/72 (BP Location: Left Arm, Patient Position: Sitting, Cuff Size: Normal)   Pulse 73   Ht 5' (1.524 m)   Wt 128 lb (58.1 kg)   LMP 12/29/2022   SpO2 100%   BMI 25.00 kg/m   Physical Exam Vitals reviewed.  Constitutional:      Appearance: Normal appearance. She is well-developed. She is not diaphoretic.  HENT:     Head: Normocephalic and atraumatic.     Nose: No nasal deformity, septal deviation, mucosal edema or rhinorrhea.     Right Sinus: No maxillary sinus tenderness or frontal sinus tenderness.     Left Sinus: No maxillary sinus tenderness or frontal sinus tenderness.     Mouth/Throat:     Pharynx: No oropharyngeal exudate.     Comments: Dental caries and teeth stained from chewing Betel nuts Eyes:     General: No scleral icterus.    Conjunctiva/sclera: Conjunctivae normal.     Pupils: Pupils are equal, round, and reactive to light.  Neck:     Thyroid: No thyromegaly.     Vascular: No carotid bruit or JVD.     Trachea: Trachea normal. No tracheal tenderness or tracheal deviation.  Cardiovascular:     Rate and Rhythm: Normal rate and regular rhythm.     Chest Wall: PMI is not displaced.     Pulses: Normal pulses. No decreased pulses.     Heart sounds: Normal heart sounds, S1 normal and S2 normal. Heart sounds not distant. No murmur heard.    No systolic murmur is present.     No diastolic murmur is present.     No friction rub. No gallop. No S3 or S4 sounds.  Pulmonary:     Effort: No tachypnea, accessory muscle usage or respiratory distress.     Breath sounds: No stridor. No decreased breath sounds, wheezing, rhonchi or rales.  Chest:     Chest wall: No tenderness.  Abdominal:     General: Bowel sounds are normal. There is no distension.     Palpations: Abdomen is soft. Abdomen is not rigid.     Tenderness: There is no abdominal tenderness. There is no guarding or rebound.  Musculoskeletal:         General: Normal range of motion.     Cervical back: Normal range of motion and neck supple. No edema, erythema or rigidity. No muscular tenderness. Normal range of motion.  Lymphadenopathy:     Head:     Right side of head: No submental or submandibular adenopathy.     Left side of head: No submental or submandibular adenopathy.     Cervical: No cervical adenopathy.  Skin:    General: Skin is warm and dry.     Coloration: Skin is not pale.     Findings: No rash.     Nails: There is no clubbing.  Neurological:  Mental Status: She is alert and oriented to person, place, and time.     Sensory: No sensory deficit.  Psychiatric:        Speech: Speech normal.        Behavior: Behavior normal.         Assessment & Plan:   Problem List Items Addressed This Visit       Digestive   Dental caries    Referral to dentist was made she has Medicaid      Relevant Orders   Ambulatory referral to Dentistry     Other   Language barrier to communication    Using Burmese this patient was able to communicate with the video interpreter      History of gestational diabetes mellitus (GDM) - Primary    History of gestational diabetes this will need to be screened again will check A1c and lab data      Relevant Orders   Comprehensive metabolic panel   Hemoglobin A1c   Encounter for health-related screening    Will in addition to [id number] check metabolic panel and blood counts      Early stage of pregnancy    The patient is due with her pregnancy in October she did not get the prenatal vitamins we will reissue prescription      Relevant Medications   prenatal vitamin w/FE, FA (PRENATAL 1 + 1) 27-1 MG TABS tablet   Other Relevant Orders   Comprehensive metabolic panel   CBC with Differential/Platelet  30 minutes taken increased time due to language barrier  Return in about 5 months (around 08/14/2023) for primary care follow up.   Asencion Noble, MD

## 2023-03-14 NOTE — Assessment & Plan Note (Signed)
Using Burmese this patient was able to communicate with the video interpreter

## 2023-03-14 NOTE — Patient Instructions (Addendum)
Referral to dentist made  Screening labs today  Refill on prenatal vitamins sent   Return Dr Joya Gaskins 5 months  ????????????????????????? ???????????? ????????? ????????????? ????????????????? ????????????? ????????? ?????????????????????? ??????????????????????????????????????? ????????????????????????????????????????????????????  ??????????? ?????????? ????????? ?????????????????? ????????????????? ????????? ????????? ????????????? ?????????? ??????????????? ??????????????? ????????? ????????? ??????? ?????????????? ?????????????? hkandharko taithpaathkyamtwin  aarrnaeehkyinn shoetmahote  htone kyain hkyinn ,  Development worker, international aid pyethkyinn , hcakarrpyawwhkyinn shoetmahote  narrlai r hkaathkellhkyinnnhang  aamyinaarrone hkyahoetywin hkyinn thoetkaeshoetsaw  aadhik aarronekyaww sinerar hkyahoetywinhkyet rotetaraat hcatainhkyinn .   layhpyatrawgar lakhkanarmyarr  rote hkyi  hpyitpaw laeshipyee  shotehtwaymhumyarr ,  narrlairaan shoetmahote  a minmyarrko litenarraan  hkaathkellhkyinnnhang hcakarrlonemyarrko sharhpwayraan shoetmahote hpwalraan  hkaathkellhkyinnthoet  parwainnine sai . Open in [employer]

## 2023-03-15 ENCOUNTER — Telehealth: Payer: Self-pay

## 2023-03-15 LAB — COMPREHENSIVE METABOLIC PANEL
ALT: 9 IU/L (ref 0–32)
AST: 12 IU/L (ref 0–40)
Albumin/Globulin Ratio: 1.7 (ref 1.2–2.2)
Albumin: 4.3 g/dL (ref 3.9–4.9)
Alkaline Phosphatase: 58 IU/L (ref 44–121)
BUN/Creatinine Ratio: 10 (ref 9–23)
BUN: 7 mg/dL (ref 6–24)
Bilirubin Total: 0.5 mg/dL (ref 0.0–1.2)
CO2: 20 mmol/L (ref 20–29)
Calcium: 9.6 mg/dL (ref 8.7–10.2)
Chloride: 101 mmol/L (ref 96–106)
Creatinine, Ser: 0.71 mg/dL (ref 0.57–1.00)
Globulin, Total: 2.6 g/dL (ref 1.5–4.5)
Glucose: 86 mg/dL (ref 70–99)
Potassium: 3.5 mmol/L (ref 3.5–5.2)
Sodium: 137 mmol/L (ref 134–144)
Total Protein: 6.9 g/dL (ref 6.0–8.5)
eGFR: 110 mL/min/{1.73_m2} (ref >59)

## 2023-03-15 LAB — CBC WITH DIFFERENTIAL/PLATELET
Basophils Absolute: 0 10*3/uL (ref 0.0–0.2)
Basos: 0 %
EOS (ABSOLUTE): 0.1 10*3/uL (ref 0.0–0.4)
Eos: 1 %
Hematocrit: 35.5 % (ref 34.0–46.6)
Hemoglobin: 11.9 g/dL (ref 11.1–15.9)
Immature Grans (Abs): 0.1 10*3/uL (ref 0.0–0.1)
Immature Granulocytes: 1 %
Lymphocytes Absolute: 2.1 10*3/uL (ref 0.7–3.1)
Lymphs: 23 %
MCH: 32.6 pg (ref 26.6–33.0)
MCHC: 33.5 g/dL (ref 31.5–35.7)
MCV: 97 fL (ref 79–97)
Monocytes Absolute: 0.6 10*3/uL (ref 0.1–0.9)
Monocytes: 7 %
Neutrophils Absolute: 6.2 10*3/uL (ref 1.4–7.0)
Neutrophils: 68 %
Platelets: 263 10*3/uL (ref 150–450)
RBC: 3.65 x10E6/uL — ABNORMAL LOW (ref 3.77–5.28)
RDW: 11.8 % (ref 11.7–15.4)
WBC: 9.2 10*3/uL (ref 3.4–10.8)

## 2023-03-15 LAB — HEMOGLOBIN A1C
Est. average glucose Bld gHb Est-mCnc: 82 mg/dL
Hgb A1c MFr Bld: 4.5 % — ABNORMAL LOW (ref 4.8–5.6)

## 2023-03-15 NOTE — Telephone Encounter (Signed)
-----   Message from Elsie Stain, MD sent at 03/15/2023  8:27 AM EDT ----- Will need burmese interpreter , blood counts normal, no diabetes, liver kidney normal

## 2023-03-15 NOTE — Progress Notes (Signed)
Will need burmese interpreter , blood counts normal, no diabetes, liver kidney normal

## 2023-03-15 NOTE — Telephone Encounter (Signed)
Pt was called and vm was left, Information has been sent to nurse pool.   Interpreter id# [phone]

## 2023-03-20 ENCOUNTER — Other Ambulatory Visit: Payer: Self-pay

## 2023-03-20 ENCOUNTER — Other Ambulatory Visit (HOSPITAL_COMMUNITY)
Admission: RE | Admit: 2023-03-20 | Discharge: 2023-03-20 | Disposition: A | Discharge status: Home / Self Care | Payer: Medicaid Other | Source: Ambulatory Visit | Attending: Family Medicine | Admitting: Family Medicine

## 2023-03-20 ENCOUNTER — Ambulatory Visit (INDEPENDENT_AMBULATORY_CARE_PROVIDER_SITE_OTHER): Payer: Medicaid Other

## 2023-03-20 VITALS — BP 116/79 | HR 66 | Wt 129.5 lb

## 2023-03-20 DIAGNOSIS — O0990 Supervision of high risk pregnancy, unspecified, unspecified trimester: Secondary | ICD-10-CM | POA: Insufficient documentation

## 2023-03-20 DIAGNOSIS — O0991 Supervision of high risk pregnancy, unspecified, first trimester: Secondary | ICD-10-CM

## 2023-03-20 DIAGNOSIS — Z3A11 11 weeks gestation of pregnancy: Secondary | ICD-10-CM

## 2023-03-20 DIAGNOSIS — Z3481 Encounter for supervision of other normal pregnancy, first trimester: Secondary | ICD-10-CM | POA: Diagnosis not present

## 2023-03-20 DIAGNOSIS — Z31430 Encounter of female for testing for genetic disease carrier status for procreative management: Secondary | ICD-10-CM | POA: Diagnosis not present

## 2023-03-20 MED ORDER — BLOOD PRESSURE KIT DEVI: 1.0000 | 0 refills | Status: DC | PRN

## 2023-03-20 NOTE — Progress Notes (Signed)
New OB Intake  I connected with Teresa Clark  on 03/20/23 at  8:15 AM EDT in person and verified that I am speaking with the correct person using two identifiers. Nurse is located at [facility] and pt is located at [employer] [facility].  I discussed the limitations, risks, security and privacy concerns of performing an evaluation and management service by telephone and the availability of in person appointments. I also discussed with the patient that there may be a patient responsible charge related to this service. The patient expressed understanding and agreed to proceed.  I explained I am completing New OB Intake today. We discussed EDD of 10/05/2023 that is based on LMP of 12/29/2022. Pt is G5/P4. I reviewed her allergies, medications, Medical/Surgical/OB history, and appropriate screenings. I informed her of [facility] services. [facility] information placed in AVS. Based on history, this is a high risk pregnancy.  Patient Active Problem List   Diagnosis Date Noted   Dental caries 03/14/2023   Encounter for health-related screening 03/14/2023   Early stage of pregnancy 03/14/2023   History of gestational diabetes mellitus (GDM) 11/28/2018   Language barrier to communication 02/01/2017    Concerns addressed today  Delivery Plans Plans to deliver at [facility]. Patient given information for [facility] Healthy Baby website for more information about [facility] and [place].   MyChart/Babyscripts MyChart access verified. I explained pt will have some visits in office and some virtually. Babyscripts instructions given and order placed. Patient verifies receipt of registration text/e-mail. Account successfully created and app downloaded.  Blood Pressure Cuff/Weight Scale Blood pressure cuff ordered for patient to pick-up from [employer]. Explained after first prenatal appt pt will check weekly and document in 64.   Anatomy US Explained first scheduled [place] will be around 19 weeks. Anatomy US scheduled for  05/11/2023 at 8:30am. Pt notified to arrive at 8:15am.  Labs Discussed Natera genetic screening with patient. Would like both Panorama and Horizon drawn at new OB visit. Routine prenatal labs needed.  COVID Vaccine Patient has had COVID vaccine.   Is patient a CenteringPregnancy candidate?  Not a Candidate Not a candidate due to Language barrier If accepted,    Is patient a Mom+Baby Combined Care candidate?  Not a candidate   If accepted, Mom+Baby staff notified  Social Determinants of Health Food Insecurity: Patient denies food insecurity. WIC Referral: Patient is interested in referral to [facility].  Transportation: Patient denies transportation needs. Childcare: Discussed no children allowed at ultrasound appointments. Offered childcare services; patient declines childcare services at this time.  Interested in [place]? If yes, send referral and doula dot phrase.   First visit review I reviewed new OB appt with patient. I explained they will have a provider visit that includes a pap smear. Explained pt will be seen by Dr. Norman Herrlich at first visit; encounter routed to appropriate provider. Explained that patient will be seen by pregnancy navigator following visit with provider.   Caryl Ada, CMA 03/20/2023  8:20 AM

## 2023-03-21 LAB — HCV INTERPRETATION

## 2023-03-21 LAB — CBC/D/PLT+RPR+RH+ABO+RUBIGG...
Antibody Screen: NEGATIVE
Basophils Absolute: 0.1 10*3/uL (ref 0.0–0.2)
Basos: 1 %
EOS (ABSOLUTE): 0.1 10*3/uL (ref 0.0–0.4)
Eos: 1 %
HCV Ab: NONREACTIVE
HIV Screen 4th Generation wRfx: NONREACTIVE
Hematocrit: 38.3 % (ref 34.0–46.6)
Hemoglobin: 12.8 g/dL (ref 11.1–15.9)
Hepatitis B Surface Ag: NEGATIVE
Immature Grans (Abs): 0.1 10*3/uL (ref 0.0–0.1)
Immature Granulocytes: 1 %
Lymphocytes Absolute: 2.5 10*3/uL (ref 0.7–3.1)
Lymphs: 23 %
MCH: 32.8 pg (ref 26.6–33.0)
MCHC: 33.4 g/dL (ref 31.5–35.7)
MCV: 98 fL — ABNORMAL HIGH (ref 79–97)
Monocytes Absolute: 0.6 10*3/uL (ref 0.1–0.9)
Monocytes: 5 %
Neutrophils Absolute: 7.7 10*3/uL — ABNORMAL HIGH (ref 1.4–7.0)
Neutrophils: 69 %
Platelets: 295 10*3/uL (ref 150–450)
RBC: 3.9 x10E6/uL (ref 3.77–5.28)
RDW: 12.4 % (ref 11.7–15.4)
RPR Ser Ql: NONREACTIVE
Rh Factor: POSITIVE
Rubella Antibodies, IGG: 5.41 index (ref >0.99)
WBC: 11.1 10*3/uL — ABNORMAL HIGH (ref 3.4–10.8)

## 2023-03-21 LAB — GC/CHLAMYDIA PROBE AMP (~~LOC~~) NOT AT ARMC
Chlamydia: NEGATIVE
Comment: NEGATIVE
Comment: NORMAL
Neisseria Gonorrhea: NEGATIVE

## 2023-03-22 LAB — URINE CULTURE, OB REFLEX

## 2023-03-22 LAB — CULTURE, OB URINE

## 2023-03-26 LAB — HORIZON CUSTOM: REPORT SUMMARY: NEGATIVE

## 2023-03-27 ENCOUNTER — Other Ambulatory Visit (HOSPITAL_COMMUNITY)
Admission: RE | Admit: 2023-03-27 | Discharge: 2023-03-27 | Disposition: A | Discharge status: Home / Self Care | Payer: Medicaid Other | Source: Ambulatory Visit | Attending: Advanced Practice Midwife | Admitting: Advanced Practice Midwife

## 2023-03-27 ENCOUNTER — Other Ambulatory Visit: Payer: Self-pay

## 2023-03-27 ENCOUNTER — Ambulatory Visit (INDEPENDENT_AMBULATORY_CARE_PROVIDER_SITE_OTHER): Payer: Medicaid Other | Admitting: Advanced Practice Midwife

## 2023-03-27 ENCOUNTER — Encounter: Payer: Self-pay | Admitting: Advanced Practice Midwife

## 2023-03-27 VITALS — BP 112/75 | HR 73 | Wt 132.0 lb

## 2023-03-27 DIAGNOSIS — O09522 Supervision of elderly multigravida, second trimester: Secondary | ICD-10-CM

## 2023-03-27 DIAGNOSIS — Z3A12 12 weeks gestation of pregnancy: Secondary | ICD-10-CM | POA: Diagnosis not present

## 2023-03-27 DIAGNOSIS — O0990 Supervision of high risk pregnancy, unspecified, unspecified trimester: Secondary | ICD-10-CM | POA: Insufficient documentation

## 2023-03-27 DIAGNOSIS — O0991 Supervision of high risk pregnancy, unspecified, first trimester: Secondary | ICD-10-CM

## 2023-03-27 DIAGNOSIS — O09521 Supervision of elderly multigravida, first trimester: Secondary | ICD-10-CM

## 2023-03-27 NOTE — Progress Notes (Signed)
  Subjective:    Teresa Clark is being seen today for her first obstetrical visit.  This is a planned pregnancy. She would like her tubes tied after this pregnancy. She is at [redacted]w[redacted]d gestation. Her obstetrical history is significant for advanced maternal age and history of GDM with prior pregnancy . Relationship with FOB: spouse, living together. Patient does intend to breast feed. Pregnancy history fully reviewed.  Patient reports no complaints.  Review of Systems:   Review of Systems  All other systems reviewed and are negative.   Objective:     Wt 132 lb (59.9 kg)   LMP 12/29/2022   BMI 25.78 kg/m  Physical Exam Constitutional:      Appearance: She is well-developed.  HENT:     Head: Normocephalic.  Eyes:     Pupils: Pupils are equal, round, and reactive to light.  Cardiovascular:     Rate and Rhythm: Normal rate and regular rhythm.     Heart sounds: Normal heart sounds.  Pulmonary:     Effort: Pulmonary effort is normal. No respiratory distress.     Breath sounds: Normal breath sounds.  Abdominal:     Palpations: Abdomen is soft.     Tenderness: There is no abdominal tenderness.  Genitourinary:    Vagina: No bleeding. Vaginal discharge: mucusy.    Comments: External: no lesion Vagina: small amount of white discharge Cervix: pink, smooth, no CMT, pap collected  Uterus: AGA      Musculoskeletal:        General: Normal range of motion.     Cervical back: Normal range of motion and neck supple.  Skin:    General: Skin is warm and dry.  Neurological:     Mental Status: She is alert and oriented to person, place, and time.  Psychiatric:        Mood and Affect: Mood normal.        Behavior: Behavior normal.     Maternal Exam:  Introitus: Vaginal discharge: mucusy.       Assessment:    Pregnancy: [id number] Patient Active Problem List   Diagnosis Date Noted   Supervision of high risk pregnancy, antepartum 03/20/2023   Dental caries 03/14/2023   Encounter for  health-related screening 03/14/2023   Early stage of pregnancy 03/14/2023   History of gestational diabetes mellitus (GDM) 11/28/2018   AMA (advanced maternal age) multigravida 35+ 08/01/2018   Language barrier to communication 02/01/2017       Plan:     Initial labs drawn at NOB intake, results reviewed today Prenatal vitamins. Problem list reviewed and updated. Role of ultrasound in pregnancy discussed; fetal survey: ordered. Amniocentesis discussed:  will defer to MFM on if this is indicated due to age.Panorma is pending at the time of this visit  . Follow up in 4 weeks. 50% of 45 min visit spent on counseling and coordination of care.     Teresa Sheller DNP, CNM  03/27/23  10:08 AM

## 2023-03-28 LAB — PANORAMA PRENATAL TEST FULL PANEL:PANORAMA TEST PLUS 5 ADDITIONAL MICRODELETIONS: FETAL FRACTION: 4.7

## 2023-03-29 ENCOUNTER — Encounter: Payer: Self-pay | Admitting: Advanced Practice Midwife

## 2023-03-29 DIAGNOSIS — Z1379 Encounter for other screening for genetic and chromosomal anomalies: Secondary | ICD-10-CM | POA: Insufficient documentation

## 2023-03-29 LAB — CYTOLOGY - PAP
Comment: NEGATIVE
Diagnosis: NEGATIVE
High risk HPV: NEGATIVE

## 2023-04-02 ENCOUNTER — Telehealth: Payer: Self-pay | Admitting: Lactation Services

## 2023-04-02 DIAGNOSIS — O285 Abnormal chromosomal and genetic finding on antenatal screening of mother: Secondary | ICD-10-CM

## 2023-04-02 NOTE — Telephone Encounter (Signed)
Called MFM to schedule Genetic Counseling for abnormal Panorama. Scheduled for 4/17 10:30 am in MFM.   Called patient with assistance of [employer] Zada Finders, [phone] results of Panorama and Genetic Counseling Session.   Patient did not answer and not able to leave a message. Will need to try again at a later time.

## 2023-04-04 ENCOUNTER — Ambulatory Visit: Payer: Medicaid Other

## 2023-04-04 NOTE — Telephone Encounter (Signed)
Called pt with [employer] interpreter ID [phone] x 2; VM box full. No other numbers listed on chart.

## 2023-04-05 NOTE — Telephone Encounter (Signed)
Notified pt with [employer] # [phone] and informed pt results and the need for genetic counseling.  Pt agreed to appt in MFM on 04/09/23 at 0930.  I informed her that they will explain in detail what her results mean.  Pt verbalized understanding with no further questions.   Leonette Nutting  04/05/23

## 2023-04-09 ENCOUNTER — Ambulatory Visit: Payer: Medicaid Other | Attending: Advanced Practice Midwife

## 2023-04-09 DIAGNOSIS — Z3A14 14 weeks gestation of pregnancy: Secondary | ICD-10-CM

## 2023-04-09 DIAGNOSIS — O285 Abnormal chromosomal and genetic finding on antenatal screening of mother: Secondary | ICD-10-CM

## 2023-04-09 DIAGNOSIS — O3515X0 Maternal care for (suspected) chromosomal abnormality in fetus, sex chromosome abnormality, not applicable or unspecified: Secondary | ICD-10-CM | POA: Diagnosis not present

## 2023-04-09 DIAGNOSIS — Z8279 Family history of other congenital malformations, deformations and chromosomal abnormalities: Secondary | ICD-10-CM

## 2023-04-10 NOTE — Progress Notes (Signed)
[facility] for Maternal Fetal Care at [facility] [address] Phone:  [phone]   Fax:  [phone]    Name: Teresa Clark Indication: Discuss atypical cfDNA screen  DOB: 1981/11/09 Age: 42 y.o.   EDD: 10/05/2023 LMP: 12/29/2022 Referring Provider:  Armando Clark, CNM  EGA: [redacted]w[redacted]d Genetic Counselor: Teresa Dunk, MS, CGC  OB Hx: [id number] (twin boys & one infant that passed away) Date of Appointment: 04/09/2023  Accompanied by: Burmese interpreter. See note regarding interpreters below.  Face to Face Time: 2 hours    Note regarding interpreter: An in-person Clydie Braun interpreter was not available for Teresa Clark's genetic counseling appointment. Using the clinic iPad we contacted an [employer] Clydie Braun interpreter. Unfortunately, the [employer] Clydie Braun interpreter did not speak the dialect Teresa Clark speaks. Genetic counseling then attempted to contact a Clydie Braun interpreter via telephone from [employer]. After being transferred to multiple Clydie Braun interpreters we were unable to be connected with a Clydie Braun interpreter that spoke the correct dialect. Per the patient's request, genetic counseling contacted a Burmese interpreter ID [id number] using the clinic iPad ([employer]). Genetic counseling informed Teresa Clark using the Burmese interpreter that she has a right to receive her medical care in a language that she understands well and we could reschedule her appointment for a time when the correct Clydie Braun dialect interpreter is available. Teresa Clark verbalized understanding of this information, declined to reschedule for when the correct Clydie Braun dialect interpreter is available, and opted to continue the genetic counseling session with the Burmese interpreter.   Previous Testing Completed: Teresa Clark previously completed carrier screening. She screened to not be a carrier for Cystic Fibrosis (CF), Spinal Muscular Atrophy (SMA), Alpha  Thalassemia, and Beta Hemoglobinopathies. A negative result on carrier screening reduces the likelihood of being a carrier, however, does not entirely rule out the possibility.  Medical & Pregnancy History:  This is Teresa Clark's 5th pregnancy. She has 4 living children (40 year old female, 40 year old female, 40 year old twin males). She has had one female infant pass away at 1 year 45 months of age. Reports chewing betel nuts. Denies personal history of diabetes, high blood pressure, thyroid conditions, and seizures. Denies bleeding, infections, and fevers in this pregnancy. Denies using tobacco, alcohol, or street drugs in this pregnancy.  Family History: A pedigree was created and scanned into Epic under the Media tab. As stated above, Teresa Clark had a female infant pass away at 1 year 44 months of age. Teresa Clark reports this son was very sick from birth and had a high body temperature. She reports [place] Teresa Clark reports one of the 40 year old twin males had a hole in his heart. She reports the hole in his heart did not require surgical repair and he is otherwise healthy.  Teresa Clark reports her father died from a stroke and her mother died after a miscarriage. Teresa Clark reports her father in law died from an unknown type of cancer and she does not know why her mother in law died.  Maternal ethnicity reported as [place] Asian and paternal ethnicity reported as [place] Asian. Genetic counseling inquired about the possibility of consanguinity. Teresa Clark was unsure about the possibility of consanguinity but does not believe she and her husband are first  cousins or closer.   Genetic Counseling:   Atypical Cell-Free DNA Screening Result. Teresa Clark previously completed cell-free DNA screening (cfDNA) in this pregnancy. We reviewed in detail that this screen analyzes cell-free DNA originating from the placenta that is found in the  maternal blood circulation during pregnancy. This test can provide information regarding the presence or absence of extra fetal DNA for chromosomes 13, 18, and 21 as well as the sex chromosomes. The result is low risk for Down syndrome (Trisomy 21), Trisomy 24, and Trisomy 69, and Triploidy. For Teresa Clark Pi Creed's cfDNA result, the laboratory reports an unvalidated fetal (placental) finding outside the scope of the test involving the Y chromosome. The finding appears to be a copy number variant (CNV) that could be a deletion. This finding could also be due to mosaicism or normal variation. Genetic counseling reviewed this result in detail with Teresa Clark and offered her amniocentesis for prenatal diagnosis. This procedure involves the removal of a small amount of amniotic fluid from the sac surrounding the pregnancy with the use of a thin needle inserted through the maternal abdomen and uterus. Ultrasound guidance is used throughout the procedure. Possible procedural difficulties and complications that can arise include maternal infection, cramping, bleeding, fluid leakage, and/or pregnancy loss. The risk for pregnancy loss with an amniocentesis is 1/500. Genetic testing that could be ordered on an amniocentesis sample includes a karyotype, microarray, and testing for specific syndromes. A karyotype can detect chromosomal aneuploidies as well as large deletions or duplications of chromosomal material and chromosomal rearrangements. Microarray assesses for smaller pieces of chromosomal material that are missing or extra that fall below the detection range of a karyotype. These chromosomal changes can be associated with various microdeletion or microduplication syndromes that could have impacts on one's health or development. A microarray can also detect some microdeletion and microduplications that have unclear clinical relevance (variants of uncertain significance) and consanguinity. Teresa Pi Birchall was informed that  diagnostic testing is the only way to definitively determine if a pregnancy is affected by Y chromosome abnormalities prenatally. We also discussed that Anajulia Pi Eggert has the option of continuing to monitor the pregnancy via routine ultrasounds. However, pregnancies with Y chromosome abnormalities may not have any anomalies identified on prenatal ultrasound. Thus, a normal-appearing ultrasound would not rule out the possibility of the baby being affected. If Ivee Pi Minotti opted to monitor with only ultrasound examination, we discussed that her baby should have an evaluation by pediatric genetics after birth including postnatal genetic testing.   Family History of Infant That [employer]. Jetta Pi Mcchristian had a female infant pass away at 1 year 66 months of age. Jamey Pi Boisclair reports this son was very sick from birth and had a high body temperature. She reports they [place] any medical [place]. We discussed that it is very difficult to quote a recurrence risk for the current pregnancy to have symptoms/features similar to Leoma Pi Tolles's son that passed away given the limited and non-specific information available. If more information were to become available in the future we might be able to discuss a recurrence risk.   Family History of Congenital Heart Defects. Amran Pi Moler reports one of the 40 year old twin males had a hole in his heart. She reports the hole in his heart did not require surgical repair and he is otherwise healthy. Congenital heart defects are one of the most common types of  birth defects and occur in approximately 0.5-1% of all births. We discussed that a heart defect can be isolated, meaning it is not associated with other birth defects or intellectual disability. Isolated heart defects have multifactorial inheritance, meaning a combination of genetic and environmental factors play a role in the development. Heart defects can also be part of a more generalized syndrome,  chromosomal or Mendelian. We discussed that when there is recurrence, the defect is the same as the previously affected approximately 50% of the time. This could mean that Ladina Pi Velazquez may have a pregnancy affected by a milder or more severe defect. Second trimester targeted ultrasound at a qualified institution can detect most significant cardiac defects. Elize Pi Garverick is [facility] detailed [facility].   Exposure to Betel Nuts in Pregnancy. We discussed that research is ongoing to determine the effects of betel nuts in pregnancy. Use of betel nuts may have a significant impact on birth weight reduction and poor pregnancy outcomes.      Patient Plan:  Proceed with: Mackie Pi Mesmer verbalized understanding of all the information discussed in the genetic counseling session. Lucerito Pi Kichline repeatedly stated that she did not have any questions. She opted to continue the pregnancy and have routine prenatal care. She understands that genetic testing is indicated and recommended for this baby postnatally. Informed consent was obtained. All questions were answered.  Declined: Amniocentesis for prenatal diagnosis.    Thank you for sharing in the care of Magdalen Pi Coppess with [place].  Please do not hesitate to contact us if you have any questions.  Teresa Dunk, MS, [facility]

## 2023-04-11 ENCOUNTER — Telehealth: Payer: Self-pay | Admitting: Genetics

## 2023-04-11 NOTE — Telephone Encounter (Signed)
Genetic counseling received a phone call from Teresa Clark's friend (name not provided on the call). Teresa Clark's friend spoke minimal [place] and shared that Teresa Clark did not understand any information that was discussed during her genetic counseling appointment on Monday April 22nd using the Burmese interpreter. Her friend asked to reschedule Teresa Clark for another genetic counseling appointment using a [employer] Clydie Braun interpreter. We have scheduled Teresa Clark for another genetic counseling appointment on May 6th at 10:30am. Teresa Clark's friend shared that Teresa Clark's sister and husband both speak minimal [place]. Genetic counseling asked the friend to encourage Teresa Clark to bring her sister or husband or both to her appointment to have multiple people listening and helping with interpreting. Genetic counseling has made every effort to locate and schedule a [employer] Clydie Braun interpreter. Unfortunately, none of [employer] have an interpreter that speaks [employer] Clydie Braun. Additionally, we are unable to schedule a Pwo/P[place] Clydie Braun interpreter in advance using [employer].

## 2023-04-23 ENCOUNTER — Ambulatory Visit: Payer: Medicaid Other | Attending: Obstetrics and Gynecology

## 2023-04-23 DIAGNOSIS — O3515X0 Maternal care for (suspected) chromosomal abnormality in fetus, sex chromosome abnormality, not applicable or unspecified: Secondary | ICD-10-CM

## 2023-04-23 DIAGNOSIS — Z3A15 15 weeks gestation of pregnancy: Secondary | ICD-10-CM

## 2023-04-23 DIAGNOSIS — O09522 Supervision of elderly multigravida, second trimester: Secondary | ICD-10-CM

## 2023-04-23 DIAGNOSIS — Z1379 Encounter for other screening for genetic and chromosomal anomalies: Secondary | ICD-10-CM

## 2023-04-23 NOTE — Progress Notes (Signed)
Referring provider:Heather Corky [facility] Length of consultation:  70 minutes  Ms. Poli was seen for follow up genetic counseling today.  Following her first genetic counseling visit on 04/09/23 with Teena Dunk, genetic counselor, a friend of the patient called back stating that the patient still had questions and wanted another visit.  During the first visit, much time was spent obtaining an appropriate interpreter. The patient speaks Henri Medal, a dialect for which we have continued to be unable to find an interpreter even through multiple interpretation services.  During her first visit, she stated that she spoke limited Burmese, so that interpreter was used.  Because we were unable to locate any options for the Pwo Clydie Braun language, the patient was encouraged to bring someone with her to assist (this is not in keeping with hospital policies, but was considered our last option as recommended by [employer]). She was present today with her husband, who speaks Burmese well.  Therefore, we utilized a Burmese video interpreter Darl Pikes, [phone]) and her husband provided support through additional interpretation for the patient.  As noted in the prior genetic counseling note, we reviewed the technology of cell free DNA testing during pregnancy including the screening nature of the test and the fact that it is evaluating placental DNA fragments in maternal circulation.  The results were low risk for trisomy 44, 32, 55 and triploidy in this fetus.  However, the results showed an atypical finding on the Y chromosome outside the scope of the test. This finding is thought to be of placental origin and may represent a copy number variant that could be a deletion. We reiterated the following possible outcomes with this result: Normal placenta and normal fetus (a false positive result). Abnormal placenta with normal fetus (confined placental mosaicism). Abnormal placenta and abnormal baby (true positive,  deletion or other Y chromosome difference in the pregnancy) Mosaicism in both placenta and baby (normal and abnormal cell lines present in both placenta and fetus).  If there were to be a Y chromosome deletion in this pregnancy, we discussed that the following outcomes are possible: Normal baby (males with Y chromosome deletions often have no phenotypic effects). Infertility (several known genes for testes development and spermatogenesis are located on the Y chromosome). Sex reversal (Sex determining region on the Y (SRY) deletions can result in XY females). Other syndromes or findings that we cannot predict without additional information on the karyotype of this baby.  For additional information in this pregnancy, we offered: Amniocentesis for karyotype and chromosomal microarray to assess for chromosome conditions. We reviewed the risks, benefits and limitations of this condition along with the 1/500 risk for pregnancy loss.  We also reiterated that we cannot always predict how a given chromosome condition will be expressed in any one individual. Ultrasound - Ms. Mallen is scheduled for a detailed anatomy ultrasound on 05/11/23. At that time we can evaluate the fetal anatomy and genitalia to provide additional information for this couple. Though we can assess external genitalia, many babies with sex chromosome differences will not be detected on ultrasound. Evaluation and chromosome analysis after delivery.  This baby can be evaluated by Pediatric Medical Genetics after birth and chromosome analysis can be performed at that time if appropriate.  Plan of care: At this time, Ms. Kreiser and her husband expressed that they had no further questions. He will plan to attend her ultrasound visit to allow for better communication using a Burmese interpreter. They stated that they are concerned about the  health of the baby, but are not overly concerned about the genitalia.  We reviewed that most deletions of Y  chromosome are not expected to result in cognitive effects or anomalies of other organ systems. They stated that they want to continue to pregnancy if the baby is otherwise healthy, and we explained that no testing can guarantee a healthy baby. A detailed ultrasound is scheduled for 05/11/23.  They currently are declining amniocentesis, though may consider this option if anything concerning is noted on ultrasound. We will add this baby to the [facility] list and plan for a genetics consultation after birth.  We can be reached at [phone] with any questions.  Cherly Anderson, MS, CGC

## 2023-04-24 ENCOUNTER — Encounter: Payer: Medicaid Other | Admitting: Obstetrics & Gynecology

## 2023-05-04 ENCOUNTER — Other Ambulatory Visit: Payer: Self-pay

## 2023-05-04 ENCOUNTER — Ambulatory Visit (INDEPENDENT_AMBULATORY_CARE_PROVIDER_SITE_OTHER): Payer: Medicaid Other | Admitting: Obstetrics and Gynecology

## 2023-05-04 ENCOUNTER — Encounter: Payer: Self-pay | Admitting: Obstetrics and Gynecology

## 2023-05-04 VITALS — BP 111/57 | HR 69 | Wt 131.6 lb

## 2023-05-04 DIAGNOSIS — O09522 Supervision of elderly multigravida, second trimester: Secondary | ICD-10-CM

## 2023-05-04 DIAGNOSIS — Z98891 History of uterine scar from previous surgery: Secondary | ICD-10-CM

## 2023-05-04 DIAGNOSIS — O0990 Supervision of high risk pregnancy, unspecified, unspecified trimester: Secondary | ICD-10-CM

## 2023-05-04 DIAGNOSIS — Z8632 Personal history of gestational diabetes: Secondary | ICD-10-CM

## 2023-05-04 DIAGNOSIS — Z3A18 18 weeks gestation of pregnancy: Secondary | ICD-10-CM

## 2023-05-04 DIAGNOSIS — O0992 Supervision of high risk pregnancy, unspecified, second trimester: Secondary | ICD-10-CM

## 2023-05-04 DIAGNOSIS — Z3490 Encounter for supervision of normal pregnancy, unspecified, unspecified trimester: Secondary | ICD-10-CM

## 2023-05-04 MED ORDER — PRENATAL PLUS 27-1 MG PO TABS: 1.0000 | ORAL_TABLET | Freq: Every day | ORAL | 11 refills | Status: DC

## 2023-05-04 NOTE — Progress Notes (Signed)
   PRENATAL VISIT NOTE  Subjective:  Teresa Clark is a 42 y.o. [id number] at [redacted]w[redacted]d being seen today for ongoing prenatal care.  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  Patient reports no complaints.   . Vag. Bleeding: None.   . Denies leaking of fluid.   The following portions of the patient's history were reviewed and updated as appropriate: allergies, current medications, past family history, past medical history, past social history, past surgical history and problem list.   Objective:   Vitals:   05/04/23 0921  BP: (!) 111/57  Pulse: 69  Weight: 131 lb 9.6 oz (59.7 kg)    Fetal Status: Fetal Heart Rate (bpm): 151         General:  Alert, oriented and cooperative. Patient is in no acute distress.  Skin: Skin is warm and dry. No rash noted.   Cardiovascular: Normal heart rate noted  Respiratory: Normal respiratory effort, no problems with respiration noted  Abdomen: Soft, gravid, appropriate for gestational age.  Pain/Pressure: Absent     Pelvic: Cervical exam deferred        Extremities: Normal range of motion.     Mental Status: Normal mood and affect. Normal behavior. Normal judgment and thought content.   Assessment and Plan:  Pregnancy: [id number] at [redacted]w[redacted]d 1. Supervision of high risk pregnancy, antepartum BP and FHR normal  Was unable to pick up prenatal from pharmacy before, sending in new prenatal vitamin  2. Multigravida of advanced maternal age in second trimester Detailed u/s scheduled 5/24  3. [redacted] weeks gestation of pregnancy Routine prenatal care Discussed AFP, collecting today, pt states can  notify at next appt of results   4. History of gestational diabetes Normal hgbA1c  5. History of C-section First 3 deliveries vaginal, last two c/s, desires VBAC with BTL Per patient first child deceased around 27 1/2 years of age Will sign BTL at 18 week appt   Preterm labor symptoms and general obstetric precautions including but not limited to vaginal bleeding, contractions, leaking of fluid and fetal movement were reviewed in detail with the patient. Please refer to After Visit Summary for other counseling recommendations.   Future Appointments  Date Time Provider Department Center  05/11/2023  8:15 AM [facility] NURSE [facility]  05/11/2023  8:30 AM [facility] US3 [facility]  05/22/2023  8:15 AM [facility]  08/14/2023  8:30 AM Storm Frisk, MD [facility] None    Albertine Grates, FNP

## 2023-05-07 LAB — AFP, SERUM, OPEN SPINA BIFIDA
AFP MoM: 0.6
AFP Value: 28.9 ng/mL
Gest. Age on Collection Date: 18 weeks
Maternal Age At EDD: 40.8 yr
OSBR Risk 1 IN: 10000
Test Results:: NEGATIVE
Weight: 131 [lb_av]

## 2023-05-11 ENCOUNTER — Encounter: Payer: Self-pay | Admitting: *Deleted

## 2023-05-11 ENCOUNTER — Ambulatory Visit: Payer: Medicaid Other | Admitting: *Deleted

## 2023-05-11 ENCOUNTER — Ambulatory Visit: Payer: Medicaid Other | Attending: Advanced Practice Midwife

## 2023-05-11 ENCOUNTER — Ambulatory Visit: Payer: Medicaid Other | Attending: Family Medicine | Admitting: Obstetrics

## 2023-05-11 ENCOUNTER — Other Ambulatory Visit: Payer: Self-pay | Admitting: *Deleted

## 2023-05-11 VITALS — BP 106/64 | HR 70

## 2023-05-11 DIAGNOSIS — Z1379 Encounter for other screening for genetic and chromosomal anomalies: Secondary | ICD-10-CM

## 2023-05-11 DIAGNOSIS — O0990 Supervision of high risk pregnancy, unspecified, unspecified trimester: Secondary | ICD-10-CM | POA: Insufficient documentation

## 2023-05-11 DIAGNOSIS — O09522 Supervision of elderly multigravida, second trimester: Secondary | ICD-10-CM | POA: Diagnosis not present

## 2023-05-11 DIAGNOSIS — O280 Abnormal hematological finding on antenatal screening of mother: Secondary | ICD-10-CM

## 2023-05-11 DIAGNOSIS — O3515X0 Maternal care for (suspected) chromosomal abnormality in fetus, sex chromosome abnormality, not applicable or unspecified: Secondary | ICD-10-CM

## 2023-05-11 DIAGNOSIS — Z8632 Personal history of gestational diabetes: Secondary | ICD-10-CM | POA: Diagnosis not present

## 2023-05-11 DIAGNOSIS — Z3A19 19 weeks gestation of pregnancy: Secondary | ICD-10-CM | POA: Diagnosis not present

## 2023-05-11 DIAGNOSIS — O09292 Supervision of pregnancy with other poor reproductive or obstetric history, second trimester: Secondary | ICD-10-CM | POA: Diagnosis not present

## 2023-05-11 NOTE — Progress Notes (Signed)
MFM Note  Teresa Clark was seen for a detailed fetal anatomy scan due to advanced maternal age (42 years old).    Her cell free DNA test indicated atypical findings on the sex chromosomes.  This atypical finding involves the Y chromosome and is suspected to be of fetal/placental origin and appears to be a copy number variant that could be a deletion.  She has already met with our genetic counselor to discuss the significance of this atypical finding.  Her cell free DNA test indicated a low risk for trisomy 21, 18, and 13.  She denies any other problems in her current pregnancy.    She was informed that the fetal growth and amniotic fluid level were appropriate for her gestational age.   There were no obvious fetal anomalies noted on today's ultrasound exam.  A female fetus is noted today.  The patient was informed that anomalies may be missed due to technical limitations. If the fetus is in a suboptimal position or maternal habitus is increased, visualization of the fetus in the maternal uterus may be impaired.  Due to advanced maternal age and the atypical findings noted on her cell free DNA test, the patient was offered and declined an amniocentesis today for definitive diagnosis.  She will have her baby tested after birth to determine if any sex chromosome abnormalities are present.  Due to the atypical findings noted on her sex chromosomes and her age, we will continue to follow her with growth ultrasounds throughout her pregnancy.    As she is over the age of 44, weekly fetal testing should be started at around 34 weeks.    She will return in 5 weeks for a follow-up growth scan.    All conversations were held with the patient today with the help of a Clydie Braun interpreter.  A total of 45 minutes was spent counseling and coordinating the care for this patient.  Greater than 50% of the time was spent in direct face-to-face contact.

## 2023-05-22 ENCOUNTER — Ambulatory Visit (INDEPENDENT_AMBULATORY_CARE_PROVIDER_SITE_OTHER): Payer: Medicaid Other | Admitting: Advanced Practice Midwife

## 2023-05-22 ENCOUNTER — Other Ambulatory Visit: Payer: Self-pay

## 2023-05-22 VITALS — BP 108/73 | HR 71 | Wt 132.4 lb

## 2023-05-22 DIAGNOSIS — Z789 Other specified health status: Secondary | ICD-10-CM

## 2023-05-22 DIAGNOSIS — O09522 Supervision of elderly multigravida, second trimester: Secondary | ICD-10-CM

## 2023-05-22 DIAGNOSIS — O0992 Supervision of high risk pregnancy, unspecified, second trimester: Secondary | ICD-10-CM

## 2023-05-22 DIAGNOSIS — Z1379 Encounter for other screening for genetic and chromosomal anomalies: Secondary | ICD-10-CM

## 2023-05-22 DIAGNOSIS — O0990 Supervision of high risk pregnancy, unspecified, unspecified trimester: Secondary | ICD-10-CM

## 2023-05-22 DIAGNOSIS — Z3A20 20 weeks gestation of pregnancy: Secondary | ICD-10-CM | POA: Insufficient documentation

## 2023-05-22 NOTE — Progress Notes (Signed)
   PRENATAL VISIT NOTE  Subjective:  Teresa Clark is a 42 y.o. [id number] at [redacted]w[redacted]d being seen today for ongoing prenatal care.  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  Patient reports no complaints.  Contractions: Not present.  .  Movement: Present. Denies leaking of fluid.   The following portions of the patient's history were reviewed and updated as appropriate: allergies, current medications, past family history, past medical history, past social history, past surgical history and problem list.   Objective:   Vitals:   05/22/23 0825  BP: 108/73  Pulse: 71  Weight: 132 lb 6.4 oz (60.1 kg)    Fetal Status: Fetal Heart Rate (bpm): 145   Movement: Present     General:  Alert, oriented and cooperative. Patient is in no acute distress.  Skin: Skin is warm and dry. No rash noted.   Cardiovascular: Normal heart rate noted  Respiratory: Normal respiratory effort, no problems with respiration noted  Abdomen: Soft, gravid, appropriate for gestational age.  Pain/Pressure: Absent     Pelvic: Cervical exam deferred        Extremities: Normal range of motion.     Mental Status: Normal mood and affect. Normal behavior. Normal judgment and thought content.   Assessment and Plan:  Pregnancy: [id number] at [redacted]w[redacted]d 1. Multigravida of advanced maternal age in second trimester - AFP Nml - Antenatal testing per MFM  2. Language barrier to communication - [employer] used  3. Supervision of high risk pregnancy, antepartum   4. 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 [redacted] weeks gestation of pregnancy   Preterm labor symptoms and  general obstetric precautions including but not limited to vaginal bleeding, contractions, leaking of fluid and fetal movement were reviewed in detail with the patient. Please refer to After Visit Summary for other counseling recommendations.   No follow-ups on file.  Future Appointments  Date Time Provider Department Center  06/15/2023  2:30 PM [facility] NURSE [facility]  06/15/2023  2:45 PM [facility] US5 [facility]  06/18/2023 10:15 AM Adam Phenix, MD [facility]  08/14/2023  8:30 AM Storm Frisk, MD [facility] None    [name], CNM

## 2023-06-15 ENCOUNTER — Ambulatory Visit: Payer: Medicaid Other | Admitting: *Deleted

## 2023-06-15 ENCOUNTER — Other Ambulatory Visit: Payer: Self-pay | Admitting: *Deleted

## 2023-06-15 ENCOUNTER — Ambulatory Visit: Payer: Medicaid Other | Attending: Obstetrics

## 2023-06-15 VITALS — BP 119/65 | HR 79

## 2023-06-15 DIAGNOSIS — Z3A24 24 weeks gestation of pregnancy: Secondary | ICD-10-CM

## 2023-06-15 DIAGNOSIS — O280 Abnormal hematological finding on antenatal screening of mother: Secondary | ICD-10-CM | POA: Diagnosis not present

## 2023-06-15 DIAGNOSIS — O09522 Supervision of elderly multigravida, second trimester: Secondary | ICD-10-CM | POA: Diagnosis not present

## 2023-06-15 DIAGNOSIS — Z1379 Encounter for other screening for genetic and chromosomal anomalies: Secondary | ICD-10-CM | POA: Diagnosis not present

## 2023-06-15 DIAGNOSIS — O0990 Supervision of high risk pregnancy, unspecified, unspecified trimester: Secondary | ICD-10-CM

## 2023-06-15 DIAGNOSIS — O285 Abnormal chromosomal and genetic finding on antenatal screening of mother: Secondary | ICD-10-CM | POA: Diagnosis not present

## 2023-06-15 DIAGNOSIS — Q998 Other specified chromosome abnormalities: Secondary | ICD-10-CM

## 2023-06-18 ENCOUNTER — Other Ambulatory Visit: Payer: Self-pay

## 2023-06-18 ENCOUNTER — Ambulatory Visit (INDEPENDENT_AMBULATORY_CARE_PROVIDER_SITE_OTHER): Payer: Medicaid Other | Admitting: Obstetrics & Gynecology

## 2023-06-18 VITALS — BP 114/62 | HR 76 | Wt 135.9 lb

## 2023-06-18 DIAGNOSIS — O0990 Supervision of high risk pregnancy, unspecified, unspecified trimester: Secondary | ICD-10-CM

## 2023-06-18 DIAGNOSIS — Z789 Other specified health status: Secondary | ICD-10-CM

## 2023-06-18 DIAGNOSIS — Z8632 Personal history of gestational diabetes: Secondary | ICD-10-CM

## 2023-06-18 DIAGNOSIS — O09522 Supervision of elderly multigravida, second trimester: Secondary | ICD-10-CM

## 2023-06-18 DIAGNOSIS — O0992 Supervision of high risk pregnancy, unspecified, second trimester: Secondary | ICD-10-CM

## 2023-06-18 DIAGNOSIS — Z3A24 24 weeks gestation of pregnancy: Secondary | ICD-10-CM

## 2023-06-18 NOTE — Progress Notes (Signed)
   PRENATAL VISIT NOTE  Subjective:  Teresa Clark is a 42 y.o. [id number] at [redacted]w[redacted]d being seen today for ongoing prenatal care.  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  Patient reports no complaints.  Contractions: Irritability. Vag. Bleeding: None.  Movement: Present. Denies leaking of fluid.   The following portions of the patient's history were reviewed and updated as appropriate: allergies, current medications, past family history, past medical history, past social history, past surgical history and problem list.   Objective:   Vitals:   06/18/23 1031  BP: 114/62  Pulse: 76  Weight: 135 lb 14.4 oz (61.6 kg)    Fetal Status: Fetal Heart Rate (bpm): 152   Movement: Present     General:  Alert, oriented and cooperative. Patient is in no acute distress.  Skin: Skin is warm and dry. No rash noted.   Cardiovascular: Normal heart rate noted  Respiratory: Normal respiratory effort, no problems with respiration noted  Abdomen: Soft, gravid, appropriate for gestational age.  Pain/Pressure: Present     Pelvic: Cervical exam deferred        Extremities: Normal range of motion.  Edema: None  Mental Status: Normal mood and affect. Normal behavior. Normal judgment and thought content.   Assessment and Plan:  Pregnancy: [id number] at [redacted]w[redacted]d 1. Supervision of high risk pregnancy, antepartum   2. History of gestational diabetes mellitus (GDM) 2 hr GTT next  3. Language barrier to communication Clydie Braun video interpreter  4. Multigravida of advanced maternal age in second trimester Plans TOLAC  Preterm labor symptoms and general obstetric precautions including  but not limited to vaginal bleeding, contractions, leaking of fluid and fetal movement were reviewed in detail with the patient. Please refer to After Visit Summary for other counseling recommendations.   Return in about 4 weeks (around 07/16/2023).  Future Appointments  Date Time Provider Department Center  07/11/2023 12:45 PM [facility] US5 [facility]  08/14/2023  8:30 AM Storm Frisk, MD [facility] None    Scheryl Darter, MD

## 2023-07-11 ENCOUNTER — Other Ambulatory Visit: Payer: Self-pay | Admitting: *Deleted

## 2023-07-11 ENCOUNTER — Ambulatory Visit: Payer: Medicaid Other | Attending: Maternal & Fetal Medicine

## 2023-07-11 DIAGNOSIS — O3515X0 Maternal care for (suspected) chromosomal abnormality in fetus, sex chromosome abnormality, not applicable or unspecified: Secondary | ICD-10-CM | POA: Diagnosis not present

## 2023-07-11 DIAGNOSIS — O09522 Supervision of elderly multigravida, second trimester: Secondary | ICD-10-CM

## 2023-07-11 DIAGNOSIS — Q998 Other specified chromosome abnormalities: Secondary | ICD-10-CM | POA: Insufficient documentation

## 2023-07-11 DIAGNOSIS — Z3A27 27 weeks gestation of pregnancy: Secondary | ICD-10-CM | POA: Diagnosis not present

## 2023-07-12 ENCOUNTER — Other Ambulatory Visit: Payer: Self-pay | Admitting: Lactation Services

## 2023-07-12 DIAGNOSIS — O0990 Supervision of high risk pregnancy, unspecified, unspecified trimester: Secondary | ICD-10-CM

## 2023-07-16 ENCOUNTER — Other Ambulatory Visit: Payer: Medicaid Other

## 2023-07-16 ENCOUNTER — Other Ambulatory Visit: Payer: Self-pay

## 2023-07-16 ENCOUNTER — Ambulatory Visit (INDEPENDENT_AMBULATORY_CARE_PROVIDER_SITE_OTHER): Payer: Medicaid Other | Admitting: Obstetrics and Gynecology

## 2023-07-16 VITALS — BP 113/74 | HR 71 | Wt 136.0 lb

## 2023-07-16 DIAGNOSIS — Z23 Encounter for immunization: Secondary | ICD-10-CM

## 2023-07-16 DIAGNOSIS — Z8632 Personal history of gestational diabetes: Secondary | ICD-10-CM

## 2023-07-16 DIAGNOSIS — O0990 Supervision of high risk pregnancy, unspecified, unspecified trimester: Secondary | ICD-10-CM

## 2023-07-16 DIAGNOSIS — Z98891 History of uterine scar from previous surgery: Secondary | ICD-10-CM

## 2023-07-16 DIAGNOSIS — Z1379 Encounter for other screening for genetic and chromosomal anomalies: Secondary | ICD-10-CM

## 2023-07-16 DIAGNOSIS — O09523 Supervision of elderly multigravida, third trimester: Secondary | ICD-10-CM

## 2023-07-16 DIAGNOSIS — Z789 Other specified health status: Secondary | ICD-10-CM

## 2023-07-16 DIAGNOSIS — Z3A28 28 weeks gestation of pregnancy: Secondary | ICD-10-CM

## 2023-07-16 NOTE — Addendum Note (Signed)
Addended by: Maxwell Marion E on: 07/16/2023 11:47 AM   Modules accepted: Orders

## 2023-07-16 NOTE — Progress Notes (Signed)
PRENATAL VISIT NOTE  Subjective:  Teresa Clark is a 42 y.o. [id number] at [redacted]w[redacted]d being seen today for ongoing prenatal care.  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  Patient doing well with no acute concerns today. She reports no complaints.  Contractions: Not present. Vag. Bleeding: None.  Movement: Present. Denies leaking of fluid.     42 y.o. [id number] at [redacted]w[redacted]d with Estimated Date of Delivery: 10/05/23 was seen today in office to discuss trial of labor after cesarean section (TOLAC) versus elective repeat cesarean delivery (ERCD). The following risks were discussed with the patient.  Risk of uterine rupture at term is 0.78 percent with TOLAC and 0.22 percent with ERCD. 1 in 10 uterine ruptures will result in neonatal death or neurological injury. The benefits of a trial of labor after cesarean (TOLAC) resulting in a vaginal birth after cesarean (VBAC) include the following: shorter length of hospital stay and postpartum recovery (in most cases); fewer complications, such as postpartum fever, wound or uterine infection, thromboembolism (blood clots in the leg or lung), need for blood transfusion and fewer neonatal breathing problems. The risks of an attempted VBAC or TOLAC include the following: Risk of failed trial of labor after cesarean (TOLAC) without a vaginal birth after cesarean (VBAC) resulting in repeat cesarean delivery (RCD) in about 20 to 40 percent of women who attempt VBAC.  Risk of rupture of uterus resulting in an emergency cesarean delivery. The risk of uterine rupture may be related in part to the type of uterine incision made during the first  cesarean delivery. A previous transverse uterine incision has the lowest risk of rupture (0.2 to 1.5 percent risk). Vertical or T-shaped uterine incisions have a higher risk of uterine rupture (4 to 9 percent risk)The risk of fetal death is very low with both VBAC and elective repeat cesarean delivery (ERCD), but the likelihood of fetal death is higher with VBAC than with ERCD. Maternal death is very rare with either type of delivery. The risks of an elective repeat cesarean delivery (ERCD) were reviewed with the patient including but not limited to: 01/999 risk of uterine rupture which could have serious consequences, bleeding which may require transfusion; infection which may require antibiotics; injury to bowel, bladder or other surrounding organs (bowel, bladder, ureters); injury to the fetus; need for additional procedures including hysterectomy in the event of a life-threatening hemorrhage; thromboembolic phenomenon; abnormal placentation; incisional problems; death and other postoperative or anesthesia complications.    These risks and benefits are summarized on the consent form, which was reviewed with the patient during the visit.  All her questions answered and she signed a consent indicating a preference for TOLAC/ERCD. A copy of the consent was given to the patient.  (She does say that she would want tubal sterilization in the event a cesarean section is performed due to any maternal-fetal indication.)  (Patient also desires bilateral tubal sterilization (BTS) at the time of RCD, also discussed the additional risks of regret, failure rate of 0.5-1% with increased risk of ectopic gestation and permanent and irreversible nature of the procedure.  Also discussed possibility of post-tubal pain syndrome.  Other forms of reversible BCM discussed, also discussed vasectomy, patient desires BTS).  Pt has  decided on repeat c section at 39 weeks    The following portions of the patient's history were  reviewed and updated as appropriate: allergies, current medications, past family history, past medical history, past social history, past surgical history and problem list. Problem list updated.  Objective:   Vitals:   07/16/23 0857  BP: 113/74  Pulse: 71  Weight: 136 lb (61.7 kg)    Fetal Status: Fetal Heart Rate (bpm): 145 Fundal Height: 28 cm Movement: Present     General:  Alert, oriented and cooperative. Patient is in no acute distress.  Skin: Skin is warm and dry. No rash noted.   Cardiovascular: Normal heart rate noted  Respiratory: Normal respiratory effort, no problems with respiration noted  Abdomen: Soft, gravid, appropriate for gestational age.  Pain/Pressure: Absent     Pelvic: Cervical exam deferred        Extremities: Normal range of motion.  Edema: None  Mental Status:  Normal mood and affect. Normal behavior. Normal judgment and thought content.   Assessment and Plan:  Pregnancy: [id number] at [redacted]w[redacted]d  1. Supervision of high risk pregnancy, antepartum Continue routine prenatal care  2. [redacted] weeks gestation of pregnancy   3. Language barrier to communication Live interpreter present  4. History of gestational diabetes mellitus (GDM) 2 hour GTT today  5. 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 Multigravida of advanced maternal age in third trimester   7. History of cesarean delivery Pt counseled on VBAC versus repeat c section.  Pt is adamant that she wants BTL and would like to have everything done with one procedure.  Will schedule at 39 weeks  Preterm labor symptoms and general obstetric precautions including but not limited to vaginal bleeding, contractions, leaking of fluid and fetal movement were reviewed in detail with the patient.  Please refer to After Visit Summary for other counseling recommendations.   Return in about 2 weeks (around 07/30/2023) for [facility], in person.   Mariel Aloe,  MD Faculty Attending Center for [facility]

## 2023-07-18 ENCOUNTER — Encounter: Payer: Self-pay | Admitting: *Deleted

## 2023-07-31 ENCOUNTER — Ambulatory Visit: Payer: Medicaid Other | Admitting: Obstetrics and Gynecology

## 2023-07-31 ENCOUNTER — Encounter: Payer: Self-pay | Admitting: Obstetrics and Gynecology

## 2023-07-31 VITALS — BP 106/77 | HR 90 | Wt 139.6 lb

## 2023-07-31 DIAGNOSIS — Z98891 History of uterine scar from previous surgery: Secondary | ICD-10-CM

## 2023-07-31 DIAGNOSIS — O0990 Supervision of high risk pregnancy, unspecified, unspecified trimester: Secondary | ICD-10-CM

## 2023-07-31 DIAGNOSIS — Z1379 Encounter for other screening for genetic and chromosomal anomalies: Secondary | ICD-10-CM

## 2023-07-31 DIAGNOSIS — Z3A18 18 weeks gestation of pregnancy: Secondary | ICD-10-CM

## 2023-07-31 DIAGNOSIS — O09523 Supervision of elderly multigravida, third trimester: Secondary | ICD-10-CM

## 2023-07-31 DIAGNOSIS — Z789 Other specified health status: Secondary | ICD-10-CM

## 2023-07-31 DIAGNOSIS — Z3490 Encounter for supervision of normal pregnancy, unspecified, unspecified trimester: Secondary | ICD-10-CM

## 2023-07-31 DIAGNOSIS — O0993 Supervision of high risk pregnancy, unspecified, third trimester: Secondary | ICD-10-CM

## 2023-07-31 DIAGNOSIS — Z8632 Personal history of gestational diabetes: Secondary | ICD-10-CM

## 2023-07-31 DIAGNOSIS — Z3009 Encounter for other general counseling and advice on contraception: Secondary | ICD-10-CM | POA: Insufficient documentation

## 2023-07-31 DIAGNOSIS — Z9851 Tubal ligation status: Secondary | ICD-10-CM | POA: Insufficient documentation

## 2023-07-31 DIAGNOSIS — Z3A30 30 weeks gestation of pregnancy: Secondary | ICD-10-CM

## 2023-07-31 MED ORDER — PREPLUS 27-1 MG PO TABS
1.0000 | ORAL_TABLET | Freq: Every day | ORAL | 13 refills | Status: DC
Start: 2023-07-31 — End: 2024-06-25

## 2023-07-31 NOTE — Patient Instructions (Signed)

## 2023-07-31 NOTE — Progress Notes (Signed)
Patient complains of back and shoulder pain. She informed that she takes Tylenol for the pain.   When asked about taking prenatal vitamins, she stated that pharmacy informed her that medicaid does not pay for her prenatals

## 2023-07-31 NOTE — Progress Notes (Signed)
Subjective:  Teresa Clark is a 42 y.o. [id number] at [redacted]w[redacted]d being seen today for ongoing prenatal care.  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  Patient reports  general discomforts of pregnancy .  Contractions: Not present. Vag. Bleeding: None.  Movement: Present. Denies leaking of fluid.   The following portions of the patient's history were reviewed and updated as appropriate: allergies, current medications, past family history, past medical history, past social history, past surgical history and problem list. Problem list updated.  Objective:   Vitals:   07/31/23 1510  BP: 106/77  Pulse: 90  Weight: 139 lb 9.6 oz (63.3 kg)    Fetal Status: Fetal Heart Rate (bpm): 139   Movement: Present     General:  Alert, oriented and cooperative. Patient is in no acute distress.  Skin: Skin is warm and dry. No rash noted.   Cardiovascular: Normal heart rate noted  Respiratory: Normal respiratory effort, no problems with respiration noted  Abdomen: Soft, gravid, appropriate for gestational age. Pain/Pressure: Present (Shoulder and back)     Pelvic:  Cervical exam deferred        Extremities: Normal range of motion.     Mental Status: Normal mood and affect. Normal behavior. Normal judgment and thought content.   Urinalysis:      Assessment and Plan:  Pregnancy: [id number] at [redacted]w[redacted]d  1. Supervision of high risk pregnancy, antepartum Stable  2. Multigravida of advanced maternal age in third trimester See below  3. History of gestational diabetes mellitus (GDM) Normal GTT  4. 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 History of cesarean delivery Desires repeat Scheduled at 39 weeks  6. Unwanted fertility BTL papers completed 07/16/23  7. Language barrier to communication Video interrupter used during today's visit  Preterm labor symptoms and general obstetric precautions including but not limited to vaginal bleeding, contractions, leaking of fluid and fetal movement were reviewed in detail with the patient. Please refer to After Visit Summary for other counseling recommendations.  Return in about 2 weeks (around 08/14/2023) for OB visit, face to face, any provider.   Hermina Staggers, MD

## 2023-08-12 NOTE — Progress Notes (Deleted)
New Patient Office Visit  Subjective    Patient ID: Teresa Clark, female    DOB: [birth year]  Age: 42 y.o. MRN: [id number]  CC:  No chief complaint on file.   HPI 02/2023 Dorisann Pi Shifrin presents to establish care Preg EDD 10/05/23 The patient is Burmese and visit assisted with video interpreter [id number] This patient is 42 years old had twins previously requiring C-section and gestational diabetes occurred.  The patient comes in today to establish care and is pregnant again her due date is October 05, 2023.  Patient is yet to get a Pap smear but likely will get this to gynecology.  She does not drink alcohol or smoke but she does chew Betel nuts On arrival the patient has a blood pressure of 101/72.  She needs lab screenings.  She has no specific complaints.  So far no difficulty with the pregnancy.  08/12/23  Outpatient Encounter Medications as of 08/14/2023  Medication Sig   Prenatal Vit-Fe Fumarate-FA (PREPLUS) 27-1 MG TABS Take 1 tablet by mouth daily.   No facility-administered encounter medications on file as of 08/14/2023.    Past Medical History:  Diagnosis Date   Gestational diabetes    Normal postpartum 2 hr GTT   Influenza B 01/21/2019    Past Surgical History:  Procedure Laterality Date   CESAREAN SECTION MULTI-GESTATIONAL N/A 01/21/2019   Procedure: CESAREAN SECTION MULTI-GESTATIONAL;  Surgeon: Levie Heritage, DO;  Location: [facility];  Service: Obstetrics;  Laterality: N/A;   NO PAST SURGERIES      Family History  Family history unknown: Yes    Social History   Socioeconomic History   Marital status: Married    Spouse name: Not on file   Number of children: 2   Years of education: Not on file   Highest education level: Not on file  Occupational History   Not on file  Tobacco Use   Smoking status: Never   Smokeless tobacco: Never  Vaping Use   Vaping status: Never Used  Substance and Sexual Activity   Alcohol use: Never   Drug use: No   Sexual  activity: Not Currently    Birth control/protection: None  Other Topics Concern   Not on file  Social History Narrative   Not on file   Social Determinants of Health   Financial Resource Strain: Not on file  Food Insecurity: Not on file  Transportation Needs: Not on file  Physical Activity: Not on file  Stress: Not on file  Social Connections: Not on file  Intimate Partner Violence: Not on file    Review of Systems  Constitutional:  Negative for chills, diaphoresis, fever, malaise/fatigue and weight loss.  HENT:  Negative for congestion, hearing loss, nosebleeds, sore throat and tinnitus.   Eyes:  Negative for blurred vision, photophobia and redness.  Respiratory:  Negative for cough, hemoptysis, sputum production, shortness of breath, wheezing and stridor.   Cardiovascular:  Negative for chest pain, palpitations, orthopnea, claudication, leg swelling and PND.  Gastrointestinal:  Negative for abdominal pain, blood in stool, constipation, diarrhea, heartburn, nausea and vomiting.  Genitourinary:  Negative for dysuria, flank pain, frequency, hematuria and urgency.  Musculoskeletal:  Negative for back pain, falls, joint pain, myalgias and neck pain.  Skin:  Negative for itching and rash.  Neurological:  Negative for dizziness, tingling, tremors, sensory change, speech change, focal weakness, seizures, loss of consciousness, weakness and headaches.  Endo/Heme/Allergies:  Negative for environmental allergies and polydipsia. Does not  bruise/bleed easily.  Psychiatric/Behavioral:  Negative for depression, memory loss, substance abuse and suicidal ideas. The patient is not nervous/anxious and does not have insomnia.         Objective    LMP 12/29/2022   Physical Exam Vitals reviewed.  Constitutional:      Appearance: Normal appearance. She is well-developed. She is not diaphoretic.  HENT:     Head: Normocephalic and atraumatic.     Nose: No nasal deformity, septal deviation,  mucosal edema or rhinorrhea.     Right Sinus: No maxillary sinus tenderness or frontal sinus tenderness.     Left Sinus: No maxillary sinus tenderness or frontal sinus tenderness.     Mouth/Throat:     Pharynx: No oropharyngeal exudate.     Comments: Dental caries and teeth stained from chewing Betel nuts Eyes:     General: No scleral icterus.    Conjunctiva/sclera: Conjunctivae normal.     Pupils: Pupils are equal, round, and reactive to light.  Neck:     Thyroid: No thyromegaly.     Vascular: No carotid bruit or JVD.     Trachea: Trachea normal. No tracheal tenderness or tracheal deviation.  Cardiovascular:     Rate and Rhythm: Normal rate and regular rhythm.     Chest Wall: PMI is not displaced.     Pulses: Normal pulses. No decreased pulses.     Heart sounds: Normal heart sounds, S1 normal and S2 normal. Heart sounds not distant. No murmur heard.    No systolic murmur is present.     No diastolic murmur is present.     No friction rub. No gallop. No S3 or S4 sounds.  Pulmonary:     Effort: No tachypnea, accessory muscle usage or respiratory distress.     Breath sounds: No stridor. No decreased breath sounds, wheezing, rhonchi or rales.  Chest:     Chest wall: No tenderness.  Abdominal:     General: Bowel sounds are normal. There is no distension.     Palpations: Abdomen is soft. Abdomen is not rigid.     Tenderness: There is no abdominal tenderness. There is no guarding or rebound.  Musculoskeletal:        General: Normal range of motion.     Cervical back: Normal range of motion and neck supple. No edema, erythema or rigidity. No muscular tenderness. Normal range of motion.  Lymphadenopathy:     Head:     Right side of head: No submental or submandibular adenopathy.     Left side of head: No submental or submandibular adenopathy.     Cervical: No cervical adenopathy.  Skin:    General: Skin is warm and dry.     Coloration: Skin is not pale.     Findings: No rash.      Nails: There is no clubbing.  Neurological:     Mental Status: She is alert and oriented to person, place, and time.     Sensory: No sensory deficit.  Psychiatric:        Speech: Speech normal.        Behavior: Behavior normal.         Assessment & Plan:   Problem List Items Addressed This Visit   None  30 minutes taken increased time due to language barrier  No follow-ups on file.   Shan Levans, MD

## 2023-08-14 ENCOUNTER — Ambulatory Visit: Payer: Medicaid Other | Admitting: Critical Care Medicine

## 2023-08-14 ENCOUNTER — Encounter: Payer: Self-pay | Admitting: Critical Care Medicine

## 2023-08-15 ENCOUNTER — Ambulatory Visit (INDEPENDENT_AMBULATORY_CARE_PROVIDER_SITE_OTHER): Payer: Medicaid Other | Admitting: Obstetrics & Gynecology

## 2023-08-15 ENCOUNTER — Ambulatory Visit: Payer: Medicaid Other | Attending: Maternal & Fetal Medicine

## 2023-08-15 ENCOUNTER — Other Ambulatory Visit: Payer: Self-pay | Admitting: *Deleted

## 2023-08-15 VITALS — BP 109/73 | HR 83 | Wt 139.6 lb

## 2023-08-15 DIAGNOSIS — O280 Abnormal hematological finding on antenatal screening of mother: Secondary | ICD-10-CM

## 2023-08-15 DIAGNOSIS — O09523 Supervision of elderly multigravida, third trimester: Secondary | ICD-10-CM | POA: Diagnosis not present

## 2023-08-15 DIAGNOSIS — Z3A32 32 weeks gestation of pregnancy: Secondary | ICD-10-CM

## 2023-08-15 DIAGNOSIS — Z789 Other specified health status: Secondary | ICD-10-CM

## 2023-08-15 DIAGNOSIS — O09522 Supervision of elderly multigravida, second trimester: Secondary | ICD-10-CM | POA: Diagnosis not present

## 2023-08-15 DIAGNOSIS — O285 Abnormal chromosomal and genetic finding on antenatal screening of mother: Secondary | ICD-10-CM | POA: Diagnosis not present

## 2023-08-15 DIAGNOSIS — O09213 Supervision of pregnancy with history of pre-term labor, third trimester: Secondary | ICD-10-CM

## 2023-08-15 DIAGNOSIS — O0990 Supervision of high risk pregnancy, unspecified, unspecified trimester: Secondary | ICD-10-CM

## 2023-08-15 DIAGNOSIS — O34219 Maternal care for unspecified type scar from previous cesarean delivery: Secondary | ICD-10-CM | POA: Diagnosis not present

## 2023-08-15 DIAGNOSIS — Z98891 History of uterine scar from previous surgery: Secondary | ICD-10-CM

## 2023-08-15 DIAGNOSIS — O0993 Supervision of high risk pregnancy, unspecified, third trimester: Secondary | ICD-10-CM

## 2023-08-15 NOTE — Progress Notes (Signed)
   PRENATAL VISIT NOTE  Subjective:  Teresa Clark is a 42 y.o. [id number] at [redacted]w[redacted]d being seen today for ongoing prenatal care.  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  Patient reports no complaints.  Contractions: Not present. Vag. Bleeding: None.  Movement: Present. Denies leaking of fluid.   The following portions of the patient's history were reviewed and updated as appropriate: allergies, current medications, past family history, past medical history, past social history, past surgical history and problem list.   Objective:   Vitals:   08/15/23 1329  BP: 109/73  Pulse: 83  Weight: 139 lb 9.6 oz (63.3 kg)    Fetal Status: Fetal Heart Rate (bpm): 148   Movement: Present     General:  Alert, oriented and cooperative. Patient is in no acute distress.  Skin: Skin is warm and dry. No rash noted.   Cardiovascular: Normal heart rate noted  Respiratory: Normal respiratory effort, no problems with respiration noted  Abdomen: Soft, gravid, appropriate for gestational age.  Pain/Pressure: Present     Pelvic: Cervical exam deferred        Extremities: Normal range of motion.  Edema: None  Mental Status: Normal mood and affect. Normal behavior. Normal judgment and thought content.   Assessment and Plan:  Pregnancy: [id number] at [redacted]w[redacted]d 1. Supervision of high risk pregnancy, antepartum Doing well  2. Language barrier to communication  Clydie Braun interpreter by phone today  3. History of cesarean delivery Plans TOLAC  4. Multigravida of advanced maternal age in third trimester   Preterm labor symptoms and general obstetric precautions  including but not limited to vaginal bleeding, contractions, leaking of fluid and fetal movement were reviewed in detail with the patient. Please refer to After Visit Summary for other counseling recommendations.   Return in about 2 weeks (around 08/29/2023).  Future Appointments  Date Time Provider Department Center  08/22/2023  8:45 AM [facility] NST [facility]  08/29/2023  8:45 AM [facility] NST [facility]  09/05/2023  8:45 AM [facility] NST [facility]  09/12/2023 10:30 AM [facility] US2 [facility]    Scheryl Darter, MD

## 2023-08-22 ENCOUNTER — Ambulatory Visit: Payer: Medicaid Other

## 2023-08-22 ENCOUNTER — Ambulatory Visit: Payer: Medicaid Other | Attending: Obstetrics and Gynecology | Admitting: *Deleted

## 2023-08-22 DIAGNOSIS — O09523 Supervision of elderly multigravida, third trimester: Secondary | ICD-10-CM | POA: Insufficient documentation

## 2023-08-22 DIAGNOSIS — O3515X1 Maternal care for (suspected) chromosomal abnormality in fetus, sex chromosome abnormality, fetus 1: Secondary | ICD-10-CM | POA: Diagnosis not present

## 2023-08-22 DIAGNOSIS — O3515X0 Maternal care for (suspected) chromosomal abnormality in fetus, sex chromosome abnormality, not applicable or unspecified: Secondary | ICD-10-CM | POA: Diagnosis not present

## 2023-08-22 DIAGNOSIS — Z3A33 33 weeks gestation of pregnancy: Secondary | ICD-10-CM | POA: Insufficient documentation

## 2023-08-22 NOTE — Procedures (Signed)
Leilynn Pi Tubby 1981/08/16 [redacted]w[redacted]d  Fetus A Non-Stress Test Interpretation for 08/22/23-NST only  Indication: Advanced Maternal Age >40 years, atyp Y chromosome  Fetal Heart Rate A Mode: External Baseline Rate (A): 145 bpm Variability: Moderate Accelerations: 15 x 15 Decelerations: None Multiple birth?: No  Uterine Activity Mode: Toco Contraction Frequency (min): none Resting Tone Palpated: Relaxed  Interpretation (Fetal Testing) Nonstress Test Interpretation: Reactive Comments: Tracing reviewed  by Dr. Judeth Cornfield

## 2023-08-29 ENCOUNTER — Ambulatory Visit: Payer: Medicaid Other

## 2023-08-29 ENCOUNTER — Ambulatory Visit: Payer: Medicaid Other | Attending: Obstetrics and Gynecology | Admitting: *Deleted

## 2023-08-29 DIAGNOSIS — O09523 Supervision of elderly multigravida, third trimester: Secondary | ICD-10-CM

## 2023-08-29 DIAGNOSIS — Z3A34 34 weeks gestation of pregnancy: Secondary | ICD-10-CM

## 2023-08-29 DIAGNOSIS — O280 Abnormal hematological finding on antenatal screening of mother: Secondary | ICD-10-CM | POA: Diagnosis not present

## 2023-08-29 DIAGNOSIS — O24419 Gestational diabetes mellitus in pregnancy, unspecified control: Secondary | ICD-10-CM | POA: Diagnosis not present

## 2023-08-29 NOTE — Procedures (Signed)
Teresa Clark [birth year] [redacted]w[redacted]d  Fetus A Non-Stress Test Interpretation for 08/29/23-NST only  Indication: Advanced Maternal Age >40 years and his GDM  Fetal Heart Rate A Mode: External Baseline Rate (A): 135 bpm Variability: Moderate Accelerations: 15 x 15 Decelerations: None Multiple birth?: No  Uterine Activity Mode: Toco Contraction Frequency (min): none Resting Tone Palpated: Relaxed  Interpretation (Fetal Testing) Nonstress Test Interpretation: Reactive Comments: Tracing reviewed byDr.Shankar

## 2023-09-03 ENCOUNTER — Ambulatory Visit (INDEPENDENT_AMBULATORY_CARE_PROVIDER_SITE_OTHER): Payer: Medicaid Other | Admitting: Obstetrics & Gynecology

## 2023-09-03 ENCOUNTER — Other Ambulatory Visit: Payer: Self-pay | Admitting: Obstetrics & Gynecology

## 2023-09-03 ENCOUNTER — Other Ambulatory Visit (HOSPITAL_COMMUNITY)
Admission: RE | Admit: 2023-09-03 | Discharge: 2023-09-03 | Disposition: A | Discharge status: Home / Self Care | Payer: Medicaid Other | Source: Ambulatory Visit | Attending: Obstetrics & Gynecology | Admitting: Obstetrics & Gynecology

## 2023-09-03 ENCOUNTER — Other Ambulatory Visit: Payer: Self-pay

## 2023-09-03 VITALS — BP 111/74 | HR 74 | Wt 141.5 lb

## 2023-09-03 DIAGNOSIS — O09523 Supervision of elderly multigravida, third trimester: Secondary | ICD-10-CM

## 2023-09-03 DIAGNOSIS — O0990 Supervision of high risk pregnancy, unspecified, unspecified trimester: Secondary | ICD-10-CM | POA: Insufficient documentation

## 2023-09-03 DIAGNOSIS — Z98891 History of uterine scar from previous surgery: Secondary | ICD-10-CM

## 2023-09-03 DIAGNOSIS — Z3A35 35 weeks gestation of pregnancy: Secondary | ICD-10-CM | POA: Diagnosis not present

## 2023-09-03 DIAGNOSIS — Z3009 Encounter for other general counseling and advice on contraception: Secondary | ICD-10-CM

## 2023-09-03 NOTE — Progress Notes (Signed)
   PRENATAL VISIT NOTE  Subjective:  Teresa Clark is a 42 y.o. [id number] at [redacted]w[redacted]d being seen today for ongoing prenatal care.  PWO Clydie Braun interpreter Naisun#[id number] used.  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  Patient reports no complaints.  Contractions: Not present. Vag. Bleeding: None.  Movement: Present. Denies leaking of fluid.   The following portions of the patient's history were reviewed and updated as appropriate: allergies, current medications, past family history, past medical history, past social history, past surgical history and problem list.   Objective:   Vitals:   09/03/23 1009  BP: 111/74  Pulse: 74  Weight: 141 lb 8 oz (64.2 kg)    Fetal Status: Fetal Heart Rate (bpm): 149   Movement: Present     General:  Alert, oriented and cooperative. Patient is in no acute distress.  Skin: Skin is warm and dry. No rash noted.   Cardiovascular: Normal heart rate noted  Respiratory: Normal respiratory effort, no problems with respiration noted  Abdomen: Soft, gravid, appropriate for gestational age.  Pain/Pressure: Present     Pelvic: Cultures obtained in the presence of a chaperone, cervical exam deferred        Extremities: Normal range of motion.  Edema: Trace  Mental Status: Normal mood and affect. Normal behavior. Normal judgment and thought content.   Assessment and Plan:  Pregnancy: [id number] at [redacted]w[redacted]d 1. History of cesarean delivery 2. Unwanted fertility Scheduled for RCS and BTL on 09/28/23.  3. Multigravida of advanced maternal age in third trimester Will start testing next week.  4. [redacted] weeks gestation of pregnancy 5. Supervision of high risk pregnancy,  antepartum - GC/Chlamydia probe amp ([place])not at [facility] - Culture, beta strep (group b only) Preterm labor symptoms and general obstetric precautions including but not limited to vaginal bleeding, contractions, leaking of fluid and fetal movement were reviewed in detail with the patient. Please refer to After Visit Summary for other counseling recommendations.   Return in about 2 weeks (around 09/17/2023) for OFFICE OB VISIT (MD only), BPP, NST.  Future Appointments  Date Time Provider Department Center  09/05/2023  8:45 AM [facility] NST [facility]  09/12/2023 10:30 AM [facility] US2 [facility]    Jaynie Collins, MD

## 2023-09-04 LAB — GC/CHLAMYDIA PROBE AMP (~~LOC~~) NOT AT ARMC
Chlamydia: NEGATIVE
Comment: NEGATIVE
Comment: NORMAL
Neisseria Gonorrhea: NEGATIVE

## 2023-09-05 ENCOUNTER — Ambulatory Visit: Payer: Medicaid Other | Attending: Maternal & Fetal Medicine | Admitting: *Deleted

## 2023-09-05 ENCOUNTER — Ambulatory Visit: Payer: Medicaid Other

## 2023-09-05 DIAGNOSIS — Z8632 Personal history of gestational diabetes: Secondary | ICD-10-CM | POA: Insufficient documentation

## 2023-09-05 DIAGNOSIS — O09299 Supervision of pregnancy with other poor reproductive or obstetric history, unspecified trimester: Secondary | ICD-10-CM

## 2023-09-05 DIAGNOSIS — O09293 Supervision of pregnancy with other poor reproductive or obstetric history, third trimester: Secondary | ICD-10-CM | POA: Insufficient documentation

## 2023-09-05 DIAGNOSIS — Z3A35 35 weeks gestation of pregnancy: Secondary | ICD-10-CM | POA: Insufficient documentation

## 2023-09-05 DIAGNOSIS — O09523 Supervision of elderly multigravida, third trimester: Secondary | ICD-10-CM | POA: Diagnosis not present

## 2023-09-05 NOTE — Procedures (Signed)
Teresa Clark 1981-11-14 [redacted]w[redacted]d  Fetus A Non-Stress Test Interpretation for 09/05/23-NST only  Indication: Advanced Maternal Age >40 years and abn chrom  Fetal Heart Rate A Mode: External Baseline Rate (A): 145 bpm Variability: Moderate Accelerations: 15 x 15 Decelerations: None Multiple birth?: No  Uterine Activity Mode: Toco Contraction Frequency (min): none Resting Tone Palpated: Relaxed  Interpretation (Fetal Testing) Nonstress Test Interpretation: Reactive Comments: Tracing reviewed by Dr. Parke Poisson

## 2023-09-06 LAB — CULTURE, BETA STREP (GROUP B ONLY): Strep Gp B Culture: NEGATIVE

## 2023-09-12 ENCOUNTER — Other Ambulatory Visit: Payer: Self-pay | Admitting: Maternal & Fetal Medicine

## 2023-09-12 ENCOUNTER — Ambulatory Visit: Payer: Medicaid Other | Admitting: *Deleted

## 2023-09-12 ENCOUNTER — Encounter (HOSPITAL_COMMUNITY): Payer: Self-pay | Admitting: Obstetrics and Gynecology

## 2023-09-12 ENCOUNTER — Ambulatory Visit: Payer: Medicaid Other

## 2023-09-12 ENCOUNTER — Other Ambulatory Visit: Payer: Medicaid Other

## 2023-09-12 ENCOUNTER — Other Ambulatory Visit: Payer: Self-pay | Admitting: *Deleted

## 2023-09-12 ENCOUNTER — Other Ambulatory Visit: Payer: Self-pay

## 2023-09-12 ENCOUNTER — Inpatient Hospital Stay (HOSPITAL_COMMUNITY)
Admission: AD | Admit: 2023-09-12 | Discharge: 2023-09-12 | Disposition: A | Discharge status: Home / Self Care | Payer: Medicaid Other | Attending: Obstetrics and Gynecology | Admitting: Obstetrics and Gynecology

## 2023-09-12 ENCOUNTER — Ambulatory Visit: Payer: Medicaid Other | Attending: Maternal & Fetal Medicine

## 2023-09-12 DIAGNOSIS — R1031 Right lower quadrant pain: Secondary | ICD-10-CM | POA: Diagnosis present

## 2023-09-12 DIAGNOSIS — O09293 Supervision of pregnancy with other poor reproductive or obstetric history, third trimester: Secondary | ICD-10-CM

## 2023-09-12 DIAGNOSIS — O283 Abnormal ultrasonic finding on antenatal screening of mother: Secondary | ICD-10-CM | POA: Diagnosis not present

## 2023-09-12 DIAGNOSIS — O09523 Supervision of elderly multigravida, third trimester: Secondary | ICD-10-CM | POA: Diagnosis not present

## 2023-09-12 DIAGNOSIS — O280 Abnormal hematological finding on antenatal screening of mother: Secondary | ICD-10-CM | POA: Insufficient documentation

## 2023-09-12 DIAGNOSIS — O34219 Maternal care for unspecified type scar from previous cesarean delivery: Secondary | ICD-10-CM | POA: Diagnosis not present

## 2023-09-12 DIAGNOSIS — R102 Pelvic and perineal pain: Secondary | ICD-10-CM | POA: Diagnosis present

## 2023-09-12 DIAGNOSIS — R9389 Abnormal findings on diagnostic imaging of other specified body structures: Secondary | ICD-10-CM

## 2023-09-12 DIAGNOSIS — Z3A36 36 weeks gestation of pregnancy: Secondary | ICD-10-CM | POA: Insufficient documentation

## 2023-09-12 DIAGNOSIS — O368130 Decreased fetal movements, third trimester, not applicable or unspecified: Secondary | ICD-10-CM | POA: Diagnosis not present

## 2023-09-12 DIAGNOSIS — Z3689 Encounter for other specified antenatal screening: Secondary | ICD-10-CM | POA: Diagnosis not present

## 2023-09-12 NOTE — MAU Note (Signed)
Discharge instructions reviewed with patient including PTL, fetal kick counts, and follow-up appointment already scheduled. Patient verbalized understanding with [facility] interpreter.

## 2023-09-12 NOTE — Procedures (Signed)
Evan Pi Mordecai 03/29/1981 [redacted]w[redacted]d  Fetus A Non-Stress Test Interpretation for 09/12/23  Indication: Advanced Maternal Age >40 years,  ABN nips  Fetal Heart Rate A Mode: External Baseline Rate (A): 145 bpm Variability: Minimal Accelerations: 15 x 15 Decelerations: None Multiple birth?: No  Uterine Activity Mode: Toco Contraction Frequency (min): occas ui Resting Tone Palpated: Relaxed  Interpretation (Fetal Testing) Nonstress Test Interpretation: Reactive Comments: Dr Darra Lis reviewed tracing.  Sent To MAU for further monitoring

## 2023-09-12 NOTE — Progress Notes (Addendum)
Patient placed on EFM, denies pain (except points to her left groin area and states that it is uncomfortable when when walks rates 1/10), VB, LOF. Patient endorses +FM. Patient called spouse to pick up her child so she can stay for evaluation. Patient instructed to click button each time she felt the baby move. Patient verbalizes understanding.

## 2023-09-12 NOTE — MAU Provider Note (Signed)
a BPP for AMA. She is at 36w 5d. EDD  of 10/05/2023 dated by: LMP  (12/29/22). She has no  concerns today.  Sonographic findings  Single intrauterine pregnancy.  Fetal cardiac activity: Observed.  Presentation: Cephalic.  Interval fetal anatomy appears normal.  Amniotic fluid volume: Within normal limits. AFI: 14.87 cm.  MVP: 6.12 cm.  Placenta: Anterior.  BPP: 6/10.  -2 for fetal  movements and -2 for the NST.  While  there were accelerations the baseline variability was minimal.  Recommendations  -Recommend at least 2 hours of continuous fetal monitoring  at the MAU.  The patient has been notified to go to the MAU.  Unfortunately the patient speaks a language that is not  available readily through our translator services.  She is going  to follow one of our nurses in her car to the hospital to ensure  she arrives promptly.  -If she has a reactive tracing for 2 hours without decelerations  and good variability then she can be sent home and have  follow-up tomorrow for another biophysical profile.  -I have called and notified the MAU providers regarding her  arrival ----------------------------------------------------------------------                  Braxton Feathers, DO Electronically Signed Final Report   09/12/2023 11:58 am ----------------------------------------------------------------------   [place] MFM FETAL BPP W/NONSTRESS  Result Date: 09/12/2023 ----------------------------------------------------------------------  OBSTETRICS REPORT                       (Signed Final 09/12/2023 11:58 am) ---------------------------------------------------------------------- Patient Info  ID #:       [id number]                          D.O.B.:  10-29-1981 (42 yrs)  Name:       Teresa Clark                      Visit Date: 09/12/2023 10:25 am ---------------------------------------------------------------------- Performed By  Attending:        Braxton Feathers DO       Ref. Address:     [address]                                                             [place], [place]                                                             [address]  Performed By:     Marcellina Millin       [facility]                                     Fetal Care at  a BPP for AMA. She is at 36w 5d. EDD  of 10/05/2023 dated by: LMP  (12/29/22). She has no  concerns today.  Sonographic findings  Single intrauterine pregnancy.  Fetal cardiac activity: Observed.  Presentation: Cephalic.  Interval fetal anatomy appears normal.  Amniotic fluid volume: Within normal limits. AFI: 14.87 cm.  MVP: 6.12 cm.  Placenta: Anterior.  BPP: 6/10.  -2 for fetal  movements and -2 for the NST.  While  there were accelerations the baseline variability was minimal.  Recommendations  -Recommend at least 2 hours of continuous fetal monitoring  at the MAU.  The patient has been notified to go to the MAU.  Unfortunately the patient speaks a language that is not  available readily through our translator services.  She is going  to follow one of our nurses in her car to the hospital to ensure  she arrives promptly.  -If she has a reactive tracing for 2 hours without decelerations  and good variability then she can be sent home and have  follow-up tomorrow for another biophysical profile.  -I have called and notified the MAU providers regarding her  arrival ----------------------------------------------------------------------                  Braxton Feathers, DO Electronically Signed Final Report   09/12/2023 11:58 am ----------------------------------------------------------------------   [place] MFM FETAL BPP W/NONSTRESS  Result Date: 09/12/2023 ----------------------------------------------------------------------  OBSTETRICS REPORT                       (Signed Final 09/12/2023 11:58 am) ---------------------------------------------------------------------- Patient Info  ID #:       [id number]                          D.O.B.:  10-29-1981 (42 yrs)  Name:       Teresa Clark                      Visit Date: 09/12/2023 10:25 am ---------------------------------------------------------------------- Performed By  Attending:        Braxton Feathers DO       Ref. Address:     [address]                                                             [place], [place]                                                             [address]  Performed By:     Marcellina Millin       Location[facility]                                     Fetal Care at  and dry.  Neurological:     Mental Status: She is alert and oriented to person, place, and time.  Psychiatric:        Mood and Affect: Mood normal.        Behavior: Behavior normal.     Fetal Assessment 140 bpm, Mod Var, -Decels, + 15x15Accels Toco: Irregular  MAU Course  No results found for this or any previous visit (from the past 24 hour(s)). [place] MFM OB FOLLOW UP  Result Date: 09/12/2023 ----------------------------------------------------------------------  OBSTETRICS REPORT                       (Signed Final 09/12/2023 11:58 am) ---------------------------------------------------------------------- Patient Info  ID #:       [id number]                          D.O.B.:  December 16, 1981 (42 yrs)  Name:       Teresa Clark                      Visit Date: 09/12/2023 10:25 am  ---------------------------------------------------------------------- Performed By  Attending:        Braxton Feathers DO       Ref. Address:     [address]                                                             [place], [place]                                                             [address]  Performed By:     Marcellina Millin       Location:         [facility]                                     [facility] at                                                             [facility]  Referred By:      [facility] ---------------------------------------------------------------------- Orders  #  Description  and dry.  Neurological:     Mental Status: She is alert and oriented to person, place, and time.  Psychiatric:        Mood and Affect: Mood normal.        Behavior: Behavior normal.     Fetal Assessment 140 bpm, Mod Var, -Decels, + 15x15Accels Toco: Irregular  MAU Course  No results found for this or any previous visit (from the past 24 hour(s)). [place] MFM OB FOLLOW UP  Result Date: 09/12/2023 ----------------------------------------------------------------------  OBSTETRICS REPORT                       (Signed Final 09/12/2023 11:58 am) ---------------------------------------------------------------------- Patient Info  ID #:       [id number]                          D.O.B.:  December 16, 1981 (42 yrs)  Name:       Teresa Clark                      Visit Date: 09/12/2023 10:25 am  ---------------------------------------------------------------------- Performed By  Attending:        Braxton Feathers DO       Ref. Address:     [address]                                                             [place], [place]                                                             [address]  Performed By:     Marcellina Millin       Location[facility]                                     [facility] Care at                                                             [facility]  Referred By:      [facility] ---------------------------------------------------------------------- Orders  #  Description  a BPP for AMA. She is at 36w 5d. EDD  of 10/05/2023 dated by: LMP  (12/29/22). She has no  concerns today.  Sonographic findings  Single intrauterine pregnancy.  Fetal cardiac activity: Observed.  Presentation: Cephalic.  Interval fetal anatomy appears normal.  Amniotic fluid volume: Within normal limits. AFI: 14.87 cm.  MVP: 6.12 cm.  Placenta: Anterior.  BPP: 6/10.  -2 for fetal  movements and -2 for the NST.  While  there were accelerations the baseline variability was minimal.  Recommendations  -Recommend at least 2 hours of continuous fetal monitoring  at the MAU.  The patient has been notified to go to the MAU.  Unfortunately the patient speaks a language that is not  available readily through our translator services.  She is going  to follow one of our nurses in her car to the hospital to ensure  she arrives promptly.  -If she has a reactive tracing for 2 hours without decelerations  and good variability then she can be sent home and have  follow-up tomorrow for another biophysical profile.  -I have called and notified the MAU providers regarding her  arrival ----------------------------------------------------------------------                  Braxton Feathers, DO Electronically Signed Final Report   09/12/2023 11:58 am ----------------------------------------------------------------------   [place] MFM FETAL BPP W/NONSTRESS  Result Date: 09/12/2023 ----------------------------------------------------------------------  OBSTETRICS REPORT                       (Signed Final 09/12/2023 11:58 am) ---------------------------------------------------------------------- Patient Info  ID #:       [id number]                          D.O.B.:  10-29-1981 (42 yrs)  Name:       Teresa Clark                      Visit Date: 09/12/2023 10:25 am ---------------------------------------------------------------------- Performed By  Attending:        Braxton Feathers DO       Ref. Address:     [address]                                                             [place], [place]                                                             [address]  Performed By:     Marcellina Millin       [facility]                                     Fetal Care at  a BPP for AMA. She is at 36w 5d. EDD  of 10/05/2023 dated by: LMP  (12/29/22). She has no  concerns today.  Sonographic findings  Single intrauterine pregnancy.  Fetal cardiac activity: Observed.  Presentation: Cephalic.  Interval fetal anatomy appears normal.  Amniotic fluid volume: Within normal limits. AFI: 14.87 cm.  MVP: 6.12 cm.  Placenta: Anterior.  BPP: 6/10.  -2 for fetal  movements and -2 for the NST.  While  there were accelerations the baseline variability was minimal.  Recommendations  -Recommend at least 2 hours of continuous fetal monitoring  at the MAU.  The patient has been notified to go to the MAU.  Unfortunately the patient speaks a language that is not  available readily through our translator services.  She is going  to follow one of our nurses in her car to the hospital to ensure  she arrives promptly.  -If she has a reactive tracing for 2 hours without decelerations  and good variability then she can be sent home and have  follow-up tomorrow for another biophysical profile.  -I have called and notified the MAU providers regarding her  arrival ----------------------------------------------------------------------                  Braxton Feathers, DO Electronically Signed Final Report   09/12/2023 11:58 am ----------------------------------------------------------------------   [place] MFM FETAL BPP W/NONSTRESS  Result Date: 09/12/2023 ----------------------------------------------------------------------  OBSTETRICS REPORT                       (Signed Final 09/12/2023 11:58 am) ---------------------------------------------------------------------- Patient Info  ID #:       [id number]                          D.O.B.:  10-29-1981 (42 yrs)  Name:       Teresa Clark                      Visit Date: 09/12/2023 10:25 am ---------------------------------------------------------------------- Performed By  Attending:        Braxton Feathers DO       Ref. Address:     [address]                                                             [place], [place]                                                             [address]  Performed By:     Marcellina Millin       [facility]                                     Fetal Care at  a BPP for AMA. She is at 36w 5d. EDD  of 10/05/2023 dated by: LMP  (12/29/22). She has no  concerns today.  Sonographic findings  Single intrauterine pregnancy.  Fetal cardiac activity: Observed.  Presentation: Cephalic.  Interval fetal anatomy appears normal.  Amniotic fluid volume: Within normal limits. AFI: 14.87 cm.  MVP: 6.12 cm.  Placenta: Anterior.  BPP: 6/10.  -2 for fetal  movements and -2 for the NST.  While  there were accelerations the baseline variability was minimal.  Recommendations  -Recommend at least 2 hours of continuous fetal monitoring  at the MAU.  The patient has been notified to go to the MAU.  Unfortunately the patient speaks a language that is not  available readily through our translator services.  She is going  to follow one of our nurses in her car to the hospital to ensure  she arrives promptly.  -If she has a reactive tracing for 2 hours without decelerations  and good variability then she can be sent home and have  follow-up tomorrow for another biophysical profile.  -I have called and notified the MAU providers regarding her  arrival ----------------------------------------------------------------------                  Braxton Feathers, DO Electronically Signed Final Report   09/12/2023 11:58 am ----------------------------------------------------------------------   [place] MFM FETAL BPP W/NONSTRESS  Result Date: 09/12/2023 ----------------------------------------------------------------------  OBSTETRICS REPORT                       (Signed Final 09/12/2023 11:58 am) ---------------------------------------------------------------------- Patient Info  ID #:       [id number]                          D.O.B.:  10-29-1981 (42 yrs)  Name:       Teresa Clark                      Visit Date: 09/12/2023 10:25 am ---------------------------------------------------------------------- Performed By  Attending:        Braxton Feathers DO       Ref. Address:     [address]                                                             [place], [place]                                                             [address]  Performed By:     Marcellina Millin       [facility]                                     Fetal Care at  a BPP for AMA. She is at 36w 5d. EDD  of 10/05/2023 dated by: LMP  (12/29/22). She has no  concerns today.  Sonographic findings  Single intrauterine pregnancy.  Fetal cardiac activity: Observed.  Presentation: Cephalic.  Interval fetal anatomy appears normal.  Amniotic fluid volume: Within normal limits. AFI: 14.87 cm.  MVP: 6.12 cm.  Placenta: Anterior.  BPP: 6/10.  -2 for fetal  movements and -2 for the NST.  While  there were accelerations the baseline variability was minimal.  Recommendations  -Recommend at least 2 hours of continuous fetal monitoring  at the MAU.  The patient has been notified to go to the MAU.  Unfortunately the patient speaks a language that is not  available readily through our translator services.  She is going  to follow one of our nurses in her car to the hospital to ensure  she arrives promptly.  -If she has a reactive tracing for 2 hours without decelerations  and good variability then she can be sent home and have  follow-up tomorrow for another biophysical profile.  -I have called and notified the MAU providers regarding her  arrival ----------------------------------------------------------------------                  Braxton Feathers, DO Electronically Signed Final Report   09/12/2023 11:58 am ----------------------------------------------------------------------   [place] MFM FETAL BPP W/NONSTRESS  Result Date: 09/12/2023 ----------------------------------------------------------------------  OBSTETRICS REPORT                       (Signed Final 09/12/2023 11:58 am) ---------------------------------------------------------------------- Patient Info  ID #:       [id number]                          D.O.B.:  10-29-1981 (42 yrs)  Name:       Teresa Clark                      Visit Date: 09/12/2023 10:25 am ---------------------------------------------------------------------- Performed By  Attending:        Braxton Feathers DO       Ref. Address:     [address]                                                             [place], [place]                                                             [address]  Performed By:     Marcellina Millin       [facility]                                     Fetal Care at  a BPP for AMA. She is at 36w 5d. EDD  of 10/05/2023 dated by: LMP  (12/29/22). She has no  concerns today.  Sonographic findings  Single intrauterine pregnancy.  Fetal cardiac activity: Observed.  Presentation: Cephalic.  Interval fetal anatomy appears normal.  Amniotic fluid volume: Within normal limits. AFI: 14.87 cm.  MVP: 6.12 cm.  Placenta: Anterior.  BPP: 6/10.  -2 for fetal  movements and -2 for the NST.  While  there were accelerations the baseline variability was minimal.  Recommendations  -Recommend at least 2 hours of continuous fetal monitoring  at the MAU.  The patient has been notified to go to the MAU.  Unfortunately the patient speaks a language that is not  available readily through our translator services.  She is going  to follow one of our nurses in her car to the hospital to ensure  she arrives promptly.  -If she has a reactive tracing for 2 hours without decelerations  and good variability then she can be sent home and have  follow-up tomorrow for another biophysical profile.  -I have called and notified the MAU providers regarding her  arrival ----------------------------------------------------------------------                  Braxton Feathers, DO Electronically Signed Final Report   09/12/2023 11:58 am ----------------------------------------------------------------------   [place] MFM FETAL BPP W/NONSTRESS  Result Date: 09/12/2023 ----------------------------------------------------------------------  OBSTETRICS REPORT                       (Signed Final 09/12/2023 11:58 am) ---------------------------------------------------------------------- Patient Info  ID #:       [id number]                          D.O.B.:  10-29-1981 (42 yrs)  Name:       Teresa Clark                      Visit Date: 09/12/2023 10:25 am ---------------------------------------------------------------------- Performed By  Attending:        Braxton Feathers DO       Ref. Address:     [address]                                                             [place], [place]                                                             [address]  Performed By:     Marcellina Millin       [facility]                                     Fetal Care at  and dry.  Neurological:     Mental Status: She is alert and oriented to person, place, and time.  Psychiatric:        Mood and Affect: Mood normal.        Behavior: Behavior normal.     Fetal Assessment 140 bpm, Mod Var, -Decels, + 15x15Accels Toco: Irregular  MAU Course  No results found for this or any previous visit (from the past 24 hour(s)). [place] MFM OB FOLLOW UP  Result Date: 09/12/2023 ----------------------------------------------------------------------  OBSTETRICS REPORT                       (Signed Final 09/12/2023 11:58 am) ---------------------------------------------------------------------- Patient Info  ID #:       [id number]                          D.O.B.:  December 16, 1981 (42 yrs)  Name:       Teresa Clark                      Visit Date: 09/12/2023 10:25 am  ---------------------------------------------------------------------- Performed By  Attending:        Braxton Feathers DO       Ref. Address:     [address]                                                             [place], [place]                                                             [address]  Performed By:     Marcellina Millin       Location[facility]                                     [facility] at                                                             [facility]  Referred By:      [facility] ---------------------------------------------------------------------- Orders  #  Description

## 2023-09-14 ENCOUNTER — Telehealth (HOSPITAL_COMMUNITY): Payer: Self-pay | Admitting: *Deleted

## 2023-09-14 NOTE — Telephone Encounter (Signed)
Preadmission screen  

## 2023-09-14 NOTE — Patient Instructions (Signed)
Teresa Clark  09/14/2023   Your procedure is scheduled on:  09/28/2023  Arrive at 1230 at Entrance C on [address] at [facility]  and [employer]. You are invited to use the FREE valet parking or use the Visitor's parking deck.  Pick up the phone at the desk and dial [phone].  Call this number if you have problems the morning of surgery: [phone]  Remember:   Do not eat food:(After Midnight) Desps de medianoche.  Do not drink clear liquids: (After Midnight) Desps de medianoche.  Take these medicines the morning of surgery with A SIP OF WATER:  none   Do not wear jewelry, make-up or nail polish.  Do not wear lotions, powders, or perfumes. Do not wear deodorant.  Do not shave 48 hours prior to surgery.  Do not bring valuables to the hospital.  [facility] is not   responsible for any belongings or valuables brought to the hospital.  Contacts, dentures or bridgework may not be worn into surgery.  Leave suitcase in the car. After surgery it may be brought to your room.  For patients admitted to the hospital, checkout time is 11:00 AM the day of              discharge.      Please read over the following fact sheets that you were given:     Preparing for Surgery

## 2023-09-19 ENCOUNTER — Other Ambulatory Visit: Payer: Self-pay

## 2023-09-19 ENCOUNTER — Ambulatory Visit: Payer: Medicaid Other

## 2023-09-19 ENCOUNTER — Other Ambulatory Visit: Payer: Self-pay | Admitting: General Practice

## 2023-09-19 ENCOUNTER — Ambulatory Visit (INDEPENDENT_AMBULATORY_CARE_PROVIDER_SITE_OTHER): Payer: Medicaid Other | Admitting: Obstetrics and Gynecology

## 2023-09-19 VITALS — BP 123/79 | HR 77 | Wt 146.4 lb

## 2023-09-19 DIAGNOSIS — Z3A37 37 weeks gestation of pregnancy: Secondary | ICD-10-CM

## 2023-09-19 DIAGNOSIS — O0990 Supervision of high risk pregnancy, unspecified, unspecified trimester: Secondary | ICD-10-CM

## 2023-09-19 DIAGNOSIS — O09523 Supervision of elderly multigravida, third trimester: Secondary | ICD-10-CM | POA: Diagnosis not present

## 2023-09-19 DIAGNOSIS — Z98891 History of uterine scar from previous surgery: Secondary | ICD-10-CM

## 2023-09-19 DIAGNOSIS — Z789 Other specified health status: Secondary | ICD-10-CM

## 2023-09-19 DIAGNOSIS — Z3009 Encounter for other general counseling and advice on contraception: Secondary | ICD-10-CM

## 2023-09-19 DIAGNOSIS — O0993 Supervision of high risk pregnancy, unspecified, third trimester: Secondary | ICD-10-CM

## 2023-09-19 NOTE — Progress Notes (Signed)
   PRENATAL VISIT NOTE  Subjective:  Teresa Clark is a 42 y.o. [id number] at [redacted]w[redacted]d being seen today for ongoing prenatal care.  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  Patient doing well with no acute concerns today. She reports no complaints.  Contractions: Irritability. Vag. Bleeding: None.  Movement: Present. Denies leaking of fluid.   The following portions of the patient's history were reviewed and updated as appropriate: allergies, current medications, past family history, past medical history, past social history, past surgical history and problem list. Problem list updated.  Objective:   Vitals:   09/19/23 0834  BP: 123/79  Pulse: 77  Weight: 146 lb 6.4 oz (66.4 kg)    Fetal Status: Fetal Heart Rate (bpm): 143 Fundal Height: 37 cm Movement: Present     General:  Alert, oriented and cooperative. Patient is in no acute distress.  Skin: Skin is warm and dry. No rash noted.   Cardiovascular: Normal heart rate noted  Respiratory: Normal respiratory effort, no problems with respiration noted  Abdomen: Soft, gravid, appropriate for gestational age.  Pain/Pressure: Present     Pelvic: Cervical exam deferred        Extremities: Normal range of motion.  Edema: None  Mental Status:  Normal mood and affect. Normal behavior. Normal judgment and thought content.   Assessment and Plan:  Pregnancy: [id number] at [redacted]w[redacted]d  1. [redacted] weeks gestation of pregnancy   2. Multigravida of advanced maternal age in third trimester   3. History of cesarean delivery [T scheduled for repeat c/s on 10/11  4. Language barrier to communication Tele interpreter utilized  5. Supervision of high risk  pregnancy, antepartum Continue routine prenatal care  6. Unwanted fertility BTL scheduled at time of c section  Term labor symptoms and general obstetric precautions including but not limited to vaginal bleeding, contractions, leaking of fluid and fetal movement were reviewed in detail with the patient.  Please refer to After Visit Summary for other counseling recommendations.   Return in about 1 week (around 09/26/2023) for [facility], in person.   Mariel Aloe, MD Faculty Attending [facility] [employer] [facility]

## 2023-09-20 ENCOUNTER — Encounter (HOSPITAL_COMMUNITY): Payer: Self-pay

## 2023-09-21 ENCOUNTER — Inpatient Hospital Stay (HOSPITAL_COMMUNITY)
Admission: AD | Admit: 2023-09-21 | Discharge: 2023-09-21 | Disposition: A | Discharge status: Home / Self Care | Payer: Medicaid Other | Attending: Family Medicine | Admitting: Family Medicine

## 2023-09-21 ENCOUNTER — Encounter (HOSPITAL_COMMUNITY): Payer: Self-pay | Admitting: Family Medicine

## 2023-09-21 DIAGNOSIS — M79651 Pain in right thigh: Secondary | ICD-10-CM | POA: Diagnosis not present

## 2023-09-21 DIAGNOSIS — S76211A Strain of adductor muscle, fascia and tendon of right thigh, initial encounter: Secondary | ICD-10-CM

## 2023-09-21 DIAGNOSIS — B9689 Other specified bacterial agents as the cause of diseases classified elsewhere: Secondary | ICD-10-CM | POA: Insufficient documentation

## 2023-09-21 DIAGNOSIS — O99613 Diseases of the digestive system complicating pregnancy, third trimester: Secondary | ICD-10-CM | POA: Insufficient documentation

## 2023-09-21 DIAGNOSIS — N3000 Acute cystitis without hematuria: Secondary | ICD-10-CM | POA: Insufficient documentation

## 2023-09-21 DIAGNOSIS — K029 Dental caries, unspecified: Secondary | ICD-10-CM | POA: Insufficient documentation

## 2023-09-21 DIAGNOSIS — O2313 Infections of bladder in pregnancy, third trimester: Secondary | ICD-10-CM | POA: Diagnosis not present

## 2023-09-21 DIAGNOSIS — Z3A38 38 weeks gestation of pregnancy: Secondary | ICD-10-CM | POA: Diagnosis not present

## 2023-09-21 DIAGNOSIS — O26893 Other specified pregnancy related conditions, third trimester: Secondary | ICD-10-CM | POA: Insufficient documentation

## 2023-09-21 DIAGNOSIS — O9A213 Injury, poisoning and certain other consequences of external causes complicating pregnancy, third trimester: Secondary | ICD-10-CM | POA: Insufficient documentation

## 2023-09-21 DIAGNOSIS — S39011A Strain of muscle, fascia and tendon of abdomen, initial encounter: Secondary | ICD-10-CM | POA: Insufficient documentation

## 2023-09-21 DIAGNOSIS — R102 Pelvic and perineal pain: Secondary | ICD-10-CM | POA: Insufficient documentation

## 2023-09-21 DIAGNOSIS — X58XXXA Exposure to other specified factors, initial encounter: Secondary | ICD-10-CM | POA: Insufficient documentation

## 2023-09-21 DIAGNOSIS — O09523 Supervision of elderly multigravida, third trimester: Secondary | ICD-10-CM | POA: Insufficient documentation

## 2023-09-21 LAB — URINALYSIS, ROUTINE W REFLEX MICROSCOPIC
Bacteria, UA: NONE SEEN
Bilirubin Urine: NEGATIVE
Glucose, UA: NEGATIVE mg/dL
Ketones, ur: NEGATIVE mg/dL
Nitrite: NEGATIVE
Protein, ur: NEGATIVE mg/dL
Specific Gravity, Urine: 1.008 (ref 1.005–1.030)
pH: 6 (ref 5.0–8.0)

## 2023-09-21 MED ORDER — CEPHALEXIN 500 MG PO CAPS
500.0000 mg | ORAL_CAPSULE | Freq: Three times a day (TID) | ORAL | 0 refills | Status: DC
Start: 2023-09-21 — End: 2023-09-30

## 2023-09-21 MED ORDER — CYCLOBENZAPRINE HCL 5 MG PO TABS: 5.0000 mg | ORAL_TABLET | Freq: Three times a day (TID) | ORAL | 0 refills | Status: DC | PRN

## 2023-09-21 MED ORDER — CYCLOBENZAPRINE HCL 5 MG PO TABS
10.0000 mg | ORAL_TABLET | Freq: Three times a day (TID) | ORAL | Status: DC | PRN
  Administered 2023-09-21: 10 mg via ORAL
  Filled 2023-09-21: qty 2

## 2023-09-21 MED ORDER — ACETAMINOPHEN 500 MG PO TABS
1000.0000 mg | ORAL_TABLET | Freq: Once | ORAL | Status: AC
  Administered 2023-09-21: 1000 mg via ORAL
  Filled 2023-09-21: qty 2

## 2023-09-21 NOTE — MAU Provider Note (Cosign Needed Addendum)
Chief Complaint:  Leg Pain and Pelvic Pain  HPI  HPI: Teresa Clark is a 42 y.o. [id number] at [redacted]w[redacted]d who presents to maternity admissions reporting worsening R upper leg pain and pelvic pain after tripping without falling yesterday. She has had similar pain since 2nd trimester but it is not 10/10. Improved with sitting still. Worse with movement. Also experiencing burning with urination x2 days.  She reports good fetal movement, denies LOF, vaginal bleeding, vaginal itching/burning, urinary symptoms, h/a, dizziness, n/v, diarrhea, constipation or fever/chills.  She denies headache, visual changes or RUQ abdominal pain.  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 Scheduled for rCS 09/28/23.   Past Medical History: Past Medical History:  Diagnosis Date   Gestational diabetes    Normal postpartum 2 hr GTT   Influenza B 01/21/2019    Past obstetric history: OB History  Gravida Para Term Preterm AB Living  5 4 3 1   4   SAB IAB Ectopic Multiple Live Births        1 5    # Outcome Date GA Lbr Len/2nd Weight Sex Type Anes PTL Lv  5 Current           4A Preterm 01/21/19 [redacted]w[redacted]d  2370 g M CS-LTranv Spinal  LIV  4B Preterm 01/21/19 [redacted]w[redacted]d  2445 g M CS-LTranv Spinal  LIV  3 Term 01/15/14 [redacted]w[redacted]d  3165 g F Vag-Spont None  LIV     Birth Comments: None noted  2 Term 2011 [redacted]w[redacted]d 09:00 3175 g F Vag-Spont   LIV     Birth Comments: Teresa Clark  1 Term 2006 [redacted]w[redacted]d 03:00  M Vag-Spont None  DEC     Birth Comments: Teresa Clark    Obstetric Comments  One child deceased around 82 1/2 years of age per patient.      Past Surgical History: Past Surgical History:  Procedure Laterality Date   CESAREAN SECTION     CESAREAN SECTION  MULTI-GESTATIONAL N/A 01/21/2019   Procedure: CESAREAN SECTION MULTI-GESTATIONAL;  Surgeon: Levie Heritage, DO;  Location: [facility];  Service: Obstetrics;  Laterality: N/A;   NO PAST SURGERIES      Family History: Family History  Family history unknown: Yes    Social History: Social History   Tobacco Use   Smoking status: Never   Smokeless tobacco: Never  Vaping Use   Vaping status: Never Used  Substance Use Topics   Alcohol use: Never   Drug use: No    Allergies: No Known Allergies  Meds:  Medications Prior to Admission  Medication Sig Dispense Refill Last Dose   acetaminophen (TYLENOL) 500 MG tablet Take 500-1,000 mg by mouth every 6 (six) hours as needed for moderate pain.   09/20/2023   Prenatal Vit-Fe Fumarate-FA (PREPLUS) 27-1 MG TABS Take 1 tablet by mouth daily. 30 tablet 13     I have reviewed patient's Past Medical Hx, Surgical Hx, Family Hx, Social Hx, medications and allergies.   ROS:  Review of Systems Other systems negative  Physical Exam  Patient Vitals for the past 24 hrs:  BP Temp Temp src Pulse Resp SpO2  09/21/23 0951 119/82 98.7 F (37.1 C) Oral 76 16 100 %   Constitutional: Well-developed, well-nourished female in no acute distress.  Cardiovascular: normal rate  and rhythm Respiratory: normal effort, clear to auscultation bilaterally GI: Abd soft, non-tender, gravid appropriate for gestational age.   No rebound or guarding. MS: Extremities nontender, no edema, normal ROM. Homan's negative. Mild tenderness to palpation of R groin/pubic symphysis Neurologic: Alert and oriented x 4.  GU: Neg CVAT.   FHT:  Baseline 155 , moderate variability, accelerations present, no decelerations Contractions: Irregular    Labs: Results for orders placed or performed during the hospital encounter of 09/21/23 (from the past 24 hour(s))  Urinalysis, Routine w reflex microscopic -Urine, Clean Catch     Status: Abnormal   Collection Time: 09/21/23  10:02 AM  Result Value Ref Range   Color, Urine YELLOW YELLOW   APPearance CLEAR CLEAR   Specific Gravity, Urine 1.008 1.005 - 1.030   pH 6.0 5.0 - 8.0   Glucose, UA NEGATIVE NEGATIVE mg/dL   Hgb urine dipstick SMALL (A) NEGATIVE   Bilirubin Urine NEGATIVE NEGATIVE   Ketones, ur NEGATIVE NEGATIVE mg/dL   Protein, ur NEGATIVE NEGATIVE mg/dL   Nitrite NEGATIVE NEGATIVE   Leukocytes,Ua LARGE (A) NEGATIVE   RBC / HPF 0-5 0 - 5 RBC/hpf   WBC, UA 11-20 0 - 5 WBC/hpf   Bacteria, UA NONE SEEN NONE SEEN   Squamous Epithelial / HPF 6-10 0 - 5 /HPF   Mucus PRESENT    O/Positive/-- (04/02 0921)  Imaging:  [place] MFM OB FOLLOW UP  Result Date: 09/12/2023 ----------------------------------------------------------------------  OBSTETRICS REPORT                       (Signed Final 09/12/2023 11:58 am) ---------------------------------------------------------------------- Patient Info  ID #:       [id number]                          D.O.B.:  12-Jul-1981 (42 yrs)  Name:       Teresa Clark                      Visit Date: 09/12/2023 10:25 am ---------------------------------------------------------------------- Performed By  Attending:        Braxton Feathers DO       Ref. Address:     [address]                                                             [place], [place]                                                             [address]  Performed By:     Marcellina Millin       [facility]  Referred By:      [facility] ---------------------------------------------------------------------- Orders  #  Description                           Code        Ordered By  1  [place] MFM FETAL BPP                      [phone]     [employer]     W/NONSTRESS  2  [place] MFM OB  FOLLOW UP                   [id number]    Braxton Feathers ----------------------------------------------------------------------  #  Order #                     Accession #                Episode #  1  [id number]                   [id number]                 [id number]  2  [id number]                   [id number]                 [id number] ---------------------------------------------------------------------- Indications  Advanced maternal age multigravida 8+,        O78.523  third trimester (42 yrs)  Abnormal finding on antenatal screening        O28.9  (NIPS - atypical finding on sex  chromosomes)  Poor obstetric history: Previous preterm       O09.219  delivery, antepartum (C/S twins, 36w)  Previous cesarean delivery, antepartum         O34.219  Encounter for other antenatal screening        Z36.2  follow-up  Abnormal fetal ultrasound (6/10)               O28.9  [redacted] weeks gestation of pregnancy                Z3A.36  2hr GTT WNL ---------------------------------------------------------------------- Vital Signs  BP:          111/73 ---------------------------------------------------------------------- Fetal Evaluation  Num Of Fetuses:         1  Fetal Heart Rate(bpm):  145  Cardiac Activity:       Observed  Presentation:           Cephalic  Placenta:               Anterior  P. Cord Insertion:      Previously seen  Amniotic Fluid  AFI FV:      Within normal limits  AFI Sum(cm)     %Tile       Largest Pocket(cm)  14.87  55          6.12  RUQ(cm)       RLQ(cm)       LUQ(cm)        LLQ(cm)  2.23          4.29          2.23           6.12 ---------------------------------------------------------------------- Biophysical Evaluation  Amniotic F.V:   Pocket => 2 cm             F. Tone:        Observed  F. Movement:    Not Observed               N.S.T:          Equivocal  F. Breathing:   Observed                   Score:          6/10 ---------------------------------------------------------------------- Biometry  BPD:       90.2  mm     G. Age:  36w 4d         59  %    CI:         76.6   %    70 - 86                                                          FL/HC:      21.1   %    20.8 - 22.6  HC:      326.5  mm     G. Age:  37w 0d         30  %    HC/AC:      0.97        0.92 - 1.05  AC:      338.2  mm     G. Age:  37w 5d         86  %    FL/BPD:     76.5   %    71 - 87  FL:         69  mm     G. Age:  35w 3d         17  %    FL/AC:      20.4   %    20 - 24  Est. FW:    3079  gm    6 lb 13 oz      62  % ---------------------------------------------------------------------- OB History  Gravidity:    5         Term:   3        Prem:   1        SAB:   0  TOP:          0       Ectopic:  0        Living: 5 ---------------------------------------------------------------------- Gestational Age  LMP:           36w 5d        Date:  12/29/22                  EDD:   10/05/23  U/S Today:  36w 5d                                        EDD:   10/05/23  Best:          36w 5d     Det. By:  LMP  (12/29/22)          EDD:   10/05/23 ---------------------------------------------------------------------- Anatomy  Cranium:               Appears normal         Aortic Arch:            Previously seen  Cavum:                 Previously seen        Ductal Arch:            Previously seen  Ventricles:            Previously seen        Diaphragm:              Appears normal  Choroid Plexus:        Previously seen        Stomach:                Appears normal, left                                                                        sided  Cerebellum:            Previously seen        Abdomen:                Previously seen  Posterior Fossa:       Previously seen        Abdominal Wall:         Previously seen  Nuchal Fold:           Previously seen        Cord Vessels:           Previously seen  Face:                  Orbits and profile     Kidneys:                Appear normal                         previously seen  Lips:                  Previously seen         Bladder:                Appears normal  Thoracic:              Previously seen        Spine:                  Previously seen  Heart:                 Previously  seen        Upper Extremities:      Previously seen  RVOT:                  Previously seen        Lower Extremities:      Previously seen  LVOT:                  Previously seen  Other:  Fetus appears to be a female. Heels/feet, open hands/5th digits, Nasal          bone, lenses, maxilla, mandible, falx VC, 3VV and 3VTV previously          visualized. ---------------------------------------------------------------------- Comments  The patient is here for a BPP for AMA. She is at 36w 5d. EDD  of 10/05/2023 dated by: LMP  (12/29/22). She has no  concerns today.  Sonographic findings  Single intrauterine pregnancy.  Fetal cardiac activity: Observed.  Presentation: Cephalic.  Interval fetal anatomy appears normal.  Amniotic fluid volume: Within normal limits. AFI: 14.87 cm.  MVP: 6.12 cm.  Placenta: Anterior.  BPP: 6/10.  -2 for fetal movements and -2 for the NST.  While  there were accelerations the baseline variability was minimal.  Recommendations  -Recommend at least 2 hours of continuous fetal monitoring  at the MAU.  The patient has been notified to go to the MAU.  Unfortunately the patient speaks a language that is not  available readily through our translator services.  She is going  to follow one of our nurses in her car to the hospital to ensure  she arrives promptly.  -If she has a reactive tracing for 2 hours without decelerations  and good variability then she can be sent home and have  follow-up tomorrow for another biophysical profile.  -I have called and notified the MAU providers regarding her  arrival ----------------------------------------------------------------------                  Braxton Feathers, DO Electronically Signed Final Report   09/12/2023 11:58 am ----------------------------------------------------------------------   [place] MFM  FETAL BPP W/NONSTRESS  Result Date: 09/12/2023 ----------------------------------------------------------------------  OBSTETRICS REPORT                       (Signed Final 09/12/2023 11:58 am) ---------------------------------------------------------------------- Patient Info  ID #:       [id number]                          D.O.B.:  06/29/1981 (42 yrs)  Name:       TOMMY MINICHIELLO Herbert                      Visit Date: 09/12/2023 10:25 am ---------------------------------------------------------------------- Performed By  Attending:        Braxton Feathers DO       Ref. Address:     [address]                                                             [place], [place]  [address]  Performed By:     Marcellina Millin       [facility]  Referred By:      [facility] ---------------------------------------------------------------------- Orders  #  Description                           Code        Ordered By  1  [place] MFM FETAL BPP                      [id number]     [facility]     W/NONSTRESS  2  [place] MFM OB FOLLOW UP                   [id number]    Braxton Feathers ----------------------------------------------------------------------  #  Order #                     Accession #                Episode #  1  [id number]                   [id number]                 [id number]  2  [id number]                   [id number]                 [id number] ---------------------------------------------------------------------- Indications  Advanced maternal age multigravida 55+,        O44.523  third trimester (42 yrs)  Abnormal finding on antenatal screening        O28.9  (NIPS - atypical finding on sex  chromosomes)  Poor obstetric  history: Previous preterm       O09.219  delivery, antepartum (C/S twins, 36w)  Previous cesarean delivery, antepartum         O34.219  Encounter for other antenatal screening        Z36.2  follow-up  Abnormal fetal ultrasound (6/10)               O28.9  [redacted] weeks gestation of pregnancy  Z3A.36  2hr GTT WNL ---------------------------------------------------------------------- Vital Signs  BP:          111/73 ---------------------------------------------------------------------- Fetal Evaluation  Num Of Fetuses:         1  Fetal Heart Rate(bpm):  145  Cardiac Activity:       Observed  Presentation:           Cephalic  Placenta:               Anterior  P. Cord Insertion:      Previously seen  Amniotic Fluid  AFI FV:      Within normal limits  AFI Sum(cm)     %Tile       Largest Pocket(cm)  14.87           55          6.12  RUQ(cm)       RLQ(cm)       LUQ(cm)        LLQ(cm)  2.23          4.29          2.23           6.12 ---------------------------------------------------------------------- Biophysical Evaluation  Amniotic F.V:   Pocket => 2 cm             F. Tone:        Observed  F. Movement:    Not Observed               N.S.T:          Equivocal  F. Breathing:   Observed                   Score:          6/10 ---------------------------------------------------------------------- Biometry  BPD:      90.2  mm     G. Age:  36w 4d         59  %    CI:         76.6   %    70 - 86                                                          FL/HC:      21.1   %    20.8 - 22.6  HC:      326.5  mm     G. Age:  37w 0d         30  %    HC/AC:      0.97        0.92 - 1.05  AC:      338.2  mm     G. Age:  37w 5d         86  %    FL/BPD:     76.5   %    71 - 87  FL:         69  mm     G. Age:  35w 3d         17  %    FL/AC:      20.4   %    20 - 24  Est. FW:    3079  gm    6 lb 13 oz  62  % ---------------------------------------------------------------------- OB History  Gravidity:    5         Term:   3         Prem:   1        SAB:   0  TOP:          0       Ectopic:  0        Living: 5 ---------------------------------------------------------------------- Gestational Age  LMP:           36w 5d        Date:  12/29/22                  EDD:   10/05/23  U/S Today:     36w 5d                                        EDD:   10/05/23  Best:          36w 5d     Det. By:  LMP  (12/29/22)          EDD:   10/05/23 ---------------------------------------------------------------------- Anatomy  Cranium:               Appears normal         Aortic Arch:            Previously seen  Cavum:                 Previously seen        Ductal Arch:            Previously seen  Ventricles:            Previously seen        Diaphragm:              Appears normal  Choroid Plexus:        Previously seen        Stomach:                Appears normal, left                                                                        sided  Cerebellum:            Previously seen        Abdomen:                Previously seen  Posterior Fossa:       Previously seen        Abdominal Wall:         Previously seen  Nuchal Fold:           Previously seen        Cord Vessels:           Previously seen  Face:                  Orbits and profile     Kidneys:                Appear normal  previously seen  Lips:                  Previously seen        Bladder:                Appears normal  Thoracic:              Previously seen        Spine:                  Previously seen  Heart:                 Previously seen        Upper Extremities:      Previously seen  RVOT:                  Previously seen        Lower Extremities:      Previously seen  LVOT:                  Previously seen  Other:  Fetus appears to be a female. Heels/feet, open hands/5th digits, Nasal          bone, lenses, maxilla, mandible, falx VC, 3VV and 3VTV previously          visualized. ---------------------------------------------------------------------- Comments  The patient is  here for a BPP for AMA. She is at 36w 5d. EDD  of 10/05/2023 dated by: LMP  (12/29/22). She has no  concerns today.  Sonographic findings  Single intrauterine pregnancy.  Fetal cardiac activity: Observed.  Presentation: Cephalic.  Interval fetal anatomy appears normal.  Amniotic fluid volume: Within normal limits. AFI: 14.87 cm.  MVP: 6.12 cm.  Placenta: Anterior.  BPP: 6/10.  -2 for fetal movements and -2 for the NST.  While  there were accelerations the baseline variability was minimal.  Recommendations  -Recommend at least 2 hours of continuous fetal monitoring  at the MAU.  The patient has been notified to go to the MAU.  Unfortunately the patient speaks a language that is not  available readily through our translator services.  She is going  to follow one of our nurses in her car to the hospital to ensure  she arrives promptly.  -If she has a reactive tracing for 2 hours without decelerations  and good variability then she can be sent home and have  follow-up tomorrow for another biophysical profile.  -I have called and notified the MAU providers regarding her  arrival ----------------------------------------------------------------------                  Braxton Feathers, DO Electronically Signed Final Report   09/12/2023 11:58 am ----------------------------------------------------------------------    MAU Course/MDM: I have reviewed the triage vital signs and the nursing notes.   Pertinent labs & imaging results that were available during my care of the patient were reviewed by me and considered in my medical decision making (see chart for details). I have reviewed her medical records including past results, notes and treatments.   NST reviewed. Reactive.  Treatments in MAU included tylenol, flexeril. Additionally obtained UA to evaluate for UTI. Patient with rare contractions which were not symptomatic, so do not feel she is in labor or requires a cervical exam at this time.  1110: Re-evaluated  patient. Feels improved with above interventions. Pain much improved at rest.. Will take keflex for UTI. Provided flexeril for home. Encouraged rest, ice/heat, tylenol, flexeril PRN.  Assessment:  1. Inguinal strain, right, initial encounter   2. Acute cystitis without hematuria   3. [redacted] weeks gestation of pregnancy     Plan: Discharge home Labor precautions and fetal kick counts Follow up in Office for prenatal visits and recheck   Pt stable at time of discharge.  Wylene Simmer, MD OB Fellow 09/21/2023 11:16 AM

## 2023-09-21 NOTE — MAU Note (Addendum)
.  Teresa Clark is a 42 y.o. at [redacted]w[redacted]d here in MAU reporting: Worsening bilateral upper leg pain and pelvic pain after "tripping over something" yesterday. She reports she did not fall and caught herself. She reports the leg and pelvic pain have been ongoing since entering into her second trimester but reports it is now just worse. She reports she does not experience the pains as bad if she is sitting still. Also reporting burning with urination for the past two days. Denies VB or LOF. Reports constant fetal movement that makes it hard for her to rest.   Scheduled for a repeat C/S on 10/11.  [employer] Interpreter [phone] used.  Onset of complaint: Yesterday  Pain scores:  10/10 pelvis 10/10 legs  FHT: 149 initial external Lab orders placed from triage: UA

## 2023-09-25 ENCOUNTER — Encounter: Payer: Self-pay | Admitting: Family Medicine

## 2023-09-25 ENCOUNTER — Other Ambulatory Visit: Payer: Self-pay

## 2023-09-25 ENCOUNTER — Ambulatory Visit (INDEPENDENT_AMBULATORY_CARE_PROVIDER_SITE_OTHER): Payer: Medicaid Other | Admitting: Family Medicine

## 2023-09-25 VITALS — BP 124/86 | HR 76 | Wt 146.8 lb

## 2023-09-25 DIAGNOSIS — Z98891 History of uterine scar from previous surgery: Secondary | ICD-10-CM

## 2023-09-25 DIAGNOSIS — Z3009 Encounter for other general counseling and advice on contraception: Secondary | ICD-10-CM

## 2023-09-25 DIAGNOSIS — Z3A38 38 weeks gestation of pregnancy: Secondary | ICD-10-CM

## 2023-09-25 DIAGNOSIS — O09523 Supervision of elderly multigravida, third trimester: Secondary | ICD-10-CM

## 2023-09-25 DIAGNOSIS — Z789 Other specified health status: Secondary | ICD-10-CM

## 2023-09-25 DIAGNOSIS — Z1379 Encounter for other screening for genetic and chromosomal anomalies: Secondary | ICD-10-CM

## 2023-09-25 DIAGNOSIS — O0990 Supervision of high risk pregnancy, unspecified, unspecified trimester: Secondary | ICD-10-CM

## 2023-09-25 DIAGNOSIS — O0993 Supervision of high risk pregnancy, unspecified, third trimester: Secondary | ICD-10-CM

## 2023-09-25 NOTE — Progress Notes (Signed)
   Subjective:  Selia Pi Shave is a 42 y.o. [id number] at [redacted]w[redacted]d being seen today for ongoing prenatal care.  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  Patient reports no complaints.  Contractions: Irritability. Vag. Bleeding: None.  Movement: Present. Denies leaking of fluid.   The following portions of the patient's history were reviewed and updated as appropriate: allergies, current medications, past family history, past medical history, past social history, past surgical history and problem list. Problem list updated.  Objective:   Vitals:   09/25/23 0843  BP: 124/86  Pulse: 76  Weight: 146 lb 12.8 oz (66.6 kg)    Fetal Status: Fetal Heart Rate (bpm): 143   Movement: Present     General:  Alert, oriented and cooperative. Patient is in no acute distress.  Skin: Skin is warm and dry. No rash noted.   Cardiovascular: Normal heart rate noted  Respiratory: Normal respiratory effort, no problems with respiration noted  Abdomen: Soft, gravid, appropriate for gestational age. Pain/Pressure: Present     Pelvic: Vag. Bleeding: None     Cervical exam deferred        Extremities: Normal range of motion.  Edema: None  Mental Status: Normal mood and affect. Normal behavior. Normal judgment and thought content.   Urinalysis:      Assessment and Plan:  Pregnancy: [id number] at [redacted]w[redacted]d  1. Supervision of high risk pregnancy, antepartum BP and FHR normal  2. Unwanted fertility Papers signed 07/16/2023  3. Language barrier to communication Clydie Braun interpreter used throughout visit, Delman Cheadle #[id number]  4. History of cesarean delivery X2, scheduled for RCS+BTL in three days  5. 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 Multigravida of advanced maternal age in third trimester   Term labor symptoms and general obstetric precautions including but not limited to vaginal bleeding, contractions, leaking of fluid and fetal movement were reviewed in detail with the patient. Please refer to After Visit Summary for other counseling recommendations.  Return in 7 weeks (on 11/13/2023) for PP check.   Venora Maples, MD

## 2023-09-26 ENCOUNTER — Encounter (HOSPITAL_COMMUNITY)
Admission: RE | Admit: 2023-09-26 | Discharge: 2023-09-26 | Disposition: A | Discharge status: Home / Self Care | Payer: Medicaid Other | Source: Ambulatory Visit | Attending: Obstetrics and Gynecology | Admitting: Obstetrics and Gynecology

## 2023-09-26 VITALS — Wt 146.4 lb

## 2023-09-26 DIAGNOSIS — Z98891 History of uterine scar from previous surgery: Secondary | ICD-10-CM

## 2023-09-26 LAB — CBC
HCT: 35.5 % — ABNORMAL LOW (ref 36.0–46.0)
Hemoglobin: 11.5 g/dL — ABNORMAL LOW (ref 12.0–15.0)
MCH: 31.9 pg (ref 26.0–34.0)
MCHC: 32.4 g/dL (ref 30.0–36.0)
MCV: 98.6 fL (ref 80.0–100.0)
Platelets: 199 10*3/uL (ref 150–400)
RBC: 3.6 MIL/uL — ABNORMAL LOW (ref 3.87–5.11)
RDW: 13.6 % (ref 11.5–15.5)
WBC: 8.1 10*3/uL (ref 4.0–10.5)
nRBC: 0 % (ref 0.0–0.2)

## 2023-09-26 LAB — TYPE AND SCREEN
ABO/RH(D): O POS
Antibody Screen: NEGATIVE

## 2023-09-26 LAB — COMPREHENSIVE METABOLIC PANEL
ALT: 14 U/L (ref 0–44)
AST: 25 U/L (ref 15–41)
Albumin: 2.8 g/dL — ABNORMAL LOW (ref 3.5–5.0)
Alkaline Phosphatase: 160 U/L — ABNORMAL HIGH (ref 38–126)
Anion gap: 10 (ref 5–15)
BUN: 9 mg/dL (ref 6–20)
CO2: 19 mmol/L — ABNORMAL LOW (ref 22–32)
Calcium: 8.8 mg/dL — ABNORMAL LOW (ref 8.9–10.3)
Chloride: 106 mmol/L (ref 98–111)
Creatinine, Ser: 0.73 mg/dL (ref 0.44–1.00)
GFR, Estimated: >60 mL/min (ref >60)
Glucose, Bld: 148 mg/dL — ABNORMAL HIGH (ref 70–99)
Potassium: 3.2 mmol/L — ABNORMAL LOW (ref 3.5–5.1)
Sodium: 135 mmol/L (ref 135–145)
Total Bilirubin: 0.6 mg/dL (ref 0.3–1.2)
Total Protein: 6.6 g/dL (ref 6.5–8.1)

## 2023-09-26 LAB — RPR: RPR Ser Ql: NONREACTIVE

## 2023-09-26 NOTE — Progress Notes (Signed)
Called to pre-op room to speak with patient, as she was reporting to RN that she was unsure about wanting a repeat cesarean.  We had a long conversation using a phone interpreter. She stated she was unsure about her decision. I reviewed the notes and saw that she had an extensive discussion with Dr. Donavan Foil on 07/16/2023 and she had elected for RCS at that time, and that BTL was part of her decision. I discussed in detail TOLAC counseling again with her, including review of 1% risk of uterine rupture with concomitant risk of severe morbidity or mortality for about 12/998 infants and 1/10000 mothers. We also specifically discussed that she is a good candidate for TOLAC given multiple prior NSVD and that she can have a BTL regardless if she delivers vaginally or by cesarean. Finally I discussed that delivery would be recommended in two days regardless given AMA with age of 27.  After discussion patient decided to proceed with plan for RCS+BTL.   Teresa Maples, MD/MPH Attending Family Medicine Physician, [facility] for [facility], [facility]  11:41 AM 09/26/23

## 2023-09-28 ENCOUNTER — Encounter (HOSPITAL_COMMUNITY)
Admission: AD | Disposition: A | Discharge status: Home / Self Care | Payer: Self-pay | Source: Home / Self Care | Attending: Family Medicine

## 2023-09-28 ENCOUNTER — Inpatient Hospital Stay (HOSPITAL_COMMUNITY)
Admission: AD | Admit: 2023-09-28 | Discharge: 2023-09-30 | DRG: 785 | Disposition: A | Discharge status: Home / Self Care | Payer: Medicaid Other | Attending: Family Medicine | Admitting: Family Medicine

## 2023-09-28 ENCOUNTER — Inpatient Hospital Stay (HOSPITAL_COMMUNITY): Payer: Medicaid Other | Admitting: Anesthesiology

## 2023-09-28 ENCOUNTER — Encounter (HOSPITAL_COMMUNITY): Payer: Self-pay | Admitting: Obstetrics and Gynecology

## 2023-09-28 ENCOUNTER — Other Ambulatory Visit: Payer: Self-pay

## 2023-09-28 DIAGNOSIS — Z23 Encounter for immunization: Secondary | ICD-10-CM | POA: Diagnosis not present

## 2023-09-28 DIAGNOSIS — Z302 Encounter for sterilization: Secondary | ICD-10-CM

## 2023-09-28 DIAGNOSIS — Z98891 History of uterine scar from previous surgery: Secondary | ICD-10-CM

## 2023-09-28 DIAGNOSIS — O9902 Anemia complicating childbirth: Secondary | ICD-10-CM | POA: Diagnosis not present

## 2023-09-28 DIAGNOSIS — Z3A39 39 weeks gestation of pregnancy: Secondary | ICD-10-CM | POA: Diagnosis not present

## 2023-09-28 DIAGNOSIS — O34211 Maternal care for low transverse scar from previous cesarean delivery: Principal | ICD-10-CM | POA: Diagnosis present

## 2023-09-28 DIAGNOSIS — Z8632 Personal history of gestational diabetes: Secondary | ICD-10-CM

## 2023-09-28 DIAGNOSIS — O09523 Supervision of elderly multigravida, third trimester: Secondary | ICD-10-CM | POA: Diagnosis not present

## 2023-09-28 DIAGNOSIS — Z9889 Other specified postprocedural states: Principal | ICD-10-CM

## 2023-09-28 LAB — CBC
HCT: 35.1 % — ABNORMAL LOW (ref 36.0–46.0)
Hemoglobin: 11.5 g/dL — ABNORMAL LOW (ref 12.0–15.0)
MCH: 31.8 pg (ref 26.0–34.0)
MCHC: 32.8 g/dL (ref 30.0–36.0)
MCV: 97 fL (ref 80.0–100.0)
Platelets: 241 10*3/uL (ref 150–400)
RBC: 3.62 MIL/uL — ABNORMAL LOW (ref 3.87–5.11)
RDW: 13.4 % (ref 11.5–15.5)
WBC: 16.7 10*3/uL — ABNORMAL HIGH (ref 4.0–10.5)
nRBC: 0 % (ref 0.0–0.2)

## 2023-09-28 LAB — CREATININE, SERUM
Creatinine, Ser: 0.8 mg/dL (ref 0.44–1.00)
GFR, Estimated: >60 mL/min (ref >60)

## 2023-09-28 SURGERY — Surgical Case
Anesthesia: Spinal

## 2023-09-28 MED ORDER — GABAPENTIN 100 MG PO CAPS
200.0000 mg | ORAL_CAPSULE | Freq: Every day | ORAL | Status: DC
  Administered 2023-09-29 (×2): 200 mg via ORAL
  Filled 2023-09-28 (×2): qty 2

## 2023-09-28 MED ORDER — ZOLPIDEM TARTRATE 5 MG PO TABS: 5.0000 mg | ORAL_TABLET | Freq: Every evening | ORAL | Status: DC | PRN

## 2023-09-28 MED ORDER — MEASLES, MUMPS & RUBELLA VAC IJ SOLR: 0.5000 mL | Freq: Once | INTRAMUSCULAR | Status: DC

## 2023-09-28 MED ORDER — KETOROLAC TROMETHAMINE 30 MG/ML IJ SOLN
30.0000 mg | Freq: Four times a day (QID) | INTRAMUSCULAR | Status: AC
  Administered 2023-09-29 (×4): 30 mg via INTRAVENOUS
  Filled 2023-09-28 (×5): qty 1

## 2023-09-28 MED ORDER — DIPHENHYDRAMINE HCL 25 MG PO CAPS: 25.0000 mg | ORAL_CAPSULE | Freq: Four times a day (QID) | ORAL | Status: DC | PRN

## 2023-09-28 MED ORDER — DIBUCAINE (PERIANAL) 1 % EX OINT: 1.0000 | TOPICAL_OINTMENT | CUTANEOUS | Status: DC | PRN

## 2023-09-28 MED ORDER — COCONUT OIL OIL: 1.0000 | TOPICAL_OIL | Status: DC | PRN

## 2023-09-28 MED ORDER — FENTANYL CITRATE (PF) 100 MCG/2ML IJ SOLN
INTRAMUSCULAR | Status: AC
  Filled 2023-09-28: qty 2

## 2023-09-28 MED ORDER — WITCH HAZEL-GLYCERIN EX PADS: 1.0000 | MEDICATED_PAD | CUTANEOUS | Status: DC | PRN

## 2023-09-28 MED ORDER — DEXAMETHASONE SODIUM PHOSPHATE 10 MG/ML IJ SOLN
INTRAMUSCULAR | Status: AC
  Filled 2023-09-28: qty 1

## 2023-09-28 MED ORDER — IBUPROFEN 600 MG PO TABS
600.0000 mg | ORAL_TABLET | Freq: Four times a day (QID) | ORAL | Status: DC
  Administered 2023-09-29 – 2023-09-30 (×3): 600 mg via ORAL
  Filled 2023-09-28 (×3): qty 1

## 2023-09-28 MED ORDER — PRENATAL MULTIVITAMIN CH: 1.0000 | ORAL_TABLET | Freq: Every day | ORAL | Status: DC

## 2023-09-28 MED ORDER — ENOXAPARIN SODIUM 40 MG/0.4ML IJ SOSY
40.0000 mg | PREFILLED_SYRINGE | INTRAMUSCULAR | Status: DC
  Administered 2023-09-29 – 2023-09-30 (×2): 40 mg via SUBCUTANEOUS
  Filled 2023-09-28 (×2): qty 0.4

## 2023-09-28 MED ORDER — HYDROMORPHONE HCL 1 MG/ML IJ SOLN: 1.0000 mg | INTRAMUSCULAR | Status: DC | PRN

## 2023-09-28 MED ORDER — SIMETHICONE 80 MG PO CHEW
80.0000 mg | CHEWABLE_TABLET | Freq: Three times a day (TID) | ORAL | Status: DC
  Administered 2023-09-29 – 2023-09-30 (×5): 80 mg via ORAL
  Filled 2023-09-28 (×5): qty 1

## 2023-09-28 MED ORDER — PHENYLEPHRINE 80 MCG/ML (10ML) SYRINGE FOR IV PUSH (FOR BLOOD PRESSURE SUPPORT)
PREFILLED_SYRINGE | INTRAVENOUS | Status: DC | PRN
Start: 2023-09-28 — End: 2023-09-28
  Administered 2023-09-28: 80 ug via INTRAVENOUS

## 2023-09-28 MED ORDER — SIMETHICONE 80 MG PO CHEW
80.0000 mg | CHEWABLE_TABLET | ORAL | Status: DC | PRN
  Filled 2023-09-28: qty 1

## 2023-09-28 MED ORDER — MORPHINE SULFATE (PF) 0.5 MG/ML IJ SOLN
INTRAMUSCULAR | Status: DC | PRN
  Administered 2023-09-28: 150 ug via INTRATHECAL

## 2023-09-28 MED ORDER — ENOXAPARIN SODIUM 40 MG/0.4ML IJ SOSY: 40.0000 mg | PREFILLED_SYRINGE | INTRAMUSCULAR | Status: DC

## 2023-09-28 MED ORDER — SIMETHICONE 80 MG PO CHEW: 80.0000 mg | CHEWABLE_TABLET | ORAL | Status: DC | PRN

## 2023-09-28 MED ORDER — SENNOSIDES-DOCUSATE SODIUM 8.6-50 MG PO TABS
2.0000 | ORAL_TABLET | Freq: Every day | ORAL | Status: DC
  Administered 2023-09-29 – 2023-09-30 (×2): 2 via ORAL
  Filled 2023-09-28 (×2): qty 2

## 2023-09-28 MED ORDER — OXYTOCIN-SODIUM CHLORIDE 30-0.9 UT/500ML-% IV SOLN
2.5000 [IU]/h | INTRAVENOUS | Status: AC
  Administered 2023-09-28: 2.5 [IU]/h via INTRAVENOUS

## 2023-09-28 MED ORDER — MORPHINE SULFATE (PF) 0.5 MG/ML IJ SOLN
INTRAMUSCULAR | Status: AC
  Filled 2023-09-28: qty 10

## 2023-09-28 MED ORDER — PHENYLEPHRINE 80 MCG/ML (10ML) SYRINGE FOR IV PUSH (FOR BLOOD PRESSURE SUPPORT)
PREFILLED_SYRINGE | INTRAVENOUS | Status: AC
  Filled 2023-09-28: qty 10

## 2023-09-28 MED ORDER — STERILE WATER FOR IRRIGATION IR SOLN
Status: DC | PRN
  Administered 2023-09-28: 1000 mL

## 2023-09-28 MED ORDER — SIMETHICONE 80 MG PO CHEW: 80.0000 mg | CHEWABLE_TABLET | Freq: Three times a day (TID) | ORAL | Status: DC

## 2023-09-28 MED ORDER — OXYTOCIN-SODIUM CHLORIDE 30-0.9 UT/500ML-% IV SOLN
INTRAVENOUS | Status: AC
  Filled 2023-09-28: qty 500

## 2023-09-28 MED ORDER — SODIUM CHLORIDE 0.9 % IR SOLN
Status: DC | PRN
  Administered 2023-09-28: 1

## 2023-09-28 MED ORDER — LACTATED RINGERS IV SOLN: INTRAVENOUS | Status: DC

## 2023-09-28 MED ORDER — SOD CITRATE-CITRIC ACID 500-334 MG/5ML PO SOLN
ORAL | Status: AC
  Filled 2023-09-28: qty 30

## 2023-09-28 MED ORDER — CEFAZOLIN SODIUM-DEXTROSE 2-4 GM/100ML-% IV SOLN
INTRAVENOUS | Status: AC
  Filled 2023-09-28: qty 100

## 2023-09-28 MED ORDER — PRENATAL MULTIVITAMIN CH
1.0000 | ORAL_TABLET | Freq: Every day | ORAL | Status: DC
  Administered 2023-09-29 – 2023-09-30 (×2): 1 via ORAL
  Filled 2023-09-28 (×2): qty 1

## 2023-09-28 MED ORDER — ONDANSETRON HCL 4 MG/2ML IJ SOLN
INTRAMUSCULAR | Status: DC | PRN
  Administered 2023-09-28: 4 mg via INTRAVENOUS

## 2023-09-28 MED ORDER — PHENYLEPHRINE HCL-NACL 20-0.9 MG/250ML-% IV SOLN
INTRAVENOUS | Status: AC
  Filled 2023-09-28: qty 250

## 2023-09-28 MED ORDER — OXYTOCIN-SODIUM CHLORIDE 30-0.9 UT/500ML-% IV SOLN
INTRAVENOUS | Status: DC | PRN
  Administered 2023-09-28: 30 [IU] via INTRAVENOUS

## 2023-09-28 MED ORDER — SOD CITRATE-CITRIC ACID 500-334 MG/5ML PO SOLN
30.0000 mL | ORAL | Status: AC
  Administered 2023-09-28: 30 mL via ORAL

## 2023-09-28 MED ORDER — OXYTOCIN-SODIUM CHLORIDE 30-0.9 UT/500ML-% IV SOLN: 2.5000 [IU]/h | INTRAVENOUS | Status: DC

## 2023-09-28 MED ORDER — FENTANYL CITRATE (PF) 100 MCG/2ML IJ SOLN
INTRAMUSCULAR | Status: DC | PRN
  Administered 2023-09-28: 15 ug via INTRATHECAL

## 2023-09-28 MED ORDER — OXYCODONE-ACETAMINOPHEN 5-325 MG PO TABS: 2.0000 | ORAL_TABLET | ORAL | Status: DC | PRN

## 2023-09-28 MED ORDER — ONDANSETRON HCL 4 MG/2ML IJ SOLN
INTRAMUSCULAR | Status: AC
  Filled 2023-09-28: qty 2

## 2023-09-28 MED ORDER — PHENYLEPHRINE HCL-NACL 20-0.9 MG/250ML-% IV SOLN
INTRAVENOUS | Status: DC | PRN
  Administered 2023-09-28: 60 ug/min via INTRAVENOUS

## 2023-09-28 MED ORDER — MENTHOL 3 MG MT LOZG: 1.0000 | LOZENGE | OROMUCOSAL | Status: DC | PRN

## 2023-09-28 MED ORDER — CEFAZOLIN SODIUM-DEXTROSE 2-4 GM/100ML-% IV SOLN
2.0000 g | INTRAVENOUS | Status: AC
  Administered 2023-09-28: 2 g via INTRAVENOUS

## 2023-09-28 MED ORDER — KETOROLAC TROMETHAMINE 30 MG/ML IJ SOLN
INTRAMUSCULAR | Status: AC
  Filled 2023-09-28: qty 1

## 2023-09-28 MED ORDER — OXYCODONE HCL 5 MG PO TABS: 5.0000 mg | ORAL_TABLET | ORAL | Status: DC | PRN

## 2023-09-28 MED ORDER — OXYCODONE-ACETAMINOPHEN 5-325 MG PO TABS: 1.0000 | ORAL_TABLET | ORAL | Status: DC | PRN

## 2023-09-28 MED ORDER — POVIDONE-IODINE 10 % EX SWAB
2.0000 | Freq: Once | CUTANEOUS | Status: AC
  Administered 2023-09-28: 2 via TOPICAL

## 2023-09-28 MED ORDER — ACETAMINOPHEN 500 MG PO TABS
1000.0000 mg | ORAL_TABLET | Freq: Four times a day (QID) | ORAL | Status: DC
  Administered 2023-09-29 – 2023-09-30 (×6): 1000 mg via ORAL
  Filled 2023-09-28 (×8): qty 2

## 2023-09-28 MED ORDER — SENNOSIDES-DOCUSATE SODIUM 8.6-50 MG PO TABS: 2.0000 | ORAL_TABLET | Freq: Every day | ORAL | Status: DC

## 2023-09-28 MED ORDER — BUPIVACAINE IN DEXTROSE 0.75-8.25 % IT SOLN
INTRATHECAL | Status: DC | PRN
  Administered 2023-09-28: 1.4 mL via INTRATHECAL

## 2023-09-28 MED ORDER — MAGNESIUM HYDROXIDE 400 MG/5ML PO SUSP: 30.0000 mL | ORAL | Status: DC | PRN

## 2023-09-28 MED ORDER — KETOROLAC TROMETHAMINE 30 MG/ML IJ SOLN
INTRAMUSCULAR | Status: DC | PRN
Start: 2023-09-28 — End: 2023-09-28
  Administered 2023-09-28: 30 mg via INTRAVENOUS

## 2023-09-28 SURGICAL SUPPLY — 37 items
APL PRP STRL LF DISP 70% ISPRP (MISCELLANEOUS) ×2
APL SKNCLS STERI-STRIP NONHPOA (GAUZE/BANDAGES/DRESSINGS) ×1
BENZOIN TINCTURE PRP APPL 2/3 (GAUZE/BANDAGES/DRESSINGS) ×1 IMPLANT
CHLORAPREP W/TINT 26 (MISCELLANEOUS) ×2 IMPLANT
CLAMP UMBILICAL CORD (MISCELLANEOUS) ×1 IMPLANT
CLOTH BEACON ORANGE TIMEOUT ST (SAFETY) ×1 IMPLANT
DRAPE C SECTION CLR SCREEN (DRAPES) IMPLANT
DRSG OPSITE POSTOP 4X10 (GAUZE/BANDAGES/DRESSINGS) ×1 IMPLANT
ELECT REM PT RETURN 9FT ADLT (ELECTROSURGICAL) ×1
ELECTRODE REM PT RTRN 9FT ADLT (ELECTROSURGICAL) ×1 IMPLANT
EXTRACTOR VACUUM M CUP 4 TUBE (SUCTIONS) IMPLANT
GLOVE BIO SURGEON STRL SZ7.5 (GLOVE) ×1 IMPLANT
GLOVE BIOGEL PI IND STRL 7.0 (GLOVE) ×2 IMPLANT
GOWN STRL REUS W/TWL 2XL LVL3 (GOWN DISPOSABLE) ×1 IMPLANT
GOWN STRL REUS W/TWL LRG LVL3 (GOWN DISPOSABLE) ×2 IMPLANT
KIT ABG SYR 3ML LUER SLIP (SYRINGE) IMPLANT
NDL HYPO 25X5/8 SAFETYGLIDE (NEEDLE) IMPLANT
NEEDLE HYPO 22GX1.5 SAFETY (NEEDLE) ×1 IMPLANT
NEEDLE HYPO 25X5/8 SAFETYGLIDE (NEEDLE)
NS IRRIG 1000ML POUR BTL (IV SOLUTION) ×1 IMPLANT
PACK C SECTION WH (CUSTOM PROCEDURE TRAY) ×1 IMPLANT
PAD OB MATERNITY 4.3X12.25 (PERSONAL CARE ITEMS) ×1 IMPLANT
RTRCTR C-SECT PINK 25CM LRG (MISCELLANEOUS) ×1 IMPLANT
STRIP CLOSURE SKIN 1/2X4 (GAUZE/BANDAGES/DRESSINGS) ×1 IMPLANT
SUT CHROMIC 1 CTX 36 (SUTURE) ×2 IMPLANT
SUT PLAIN 0 NONE (SUTURE) ×1 IMPLANT
SUT VIC AB 1 CT1 36 (SUTURE) ×1 IMPLANT
SUT VIC AB 2-0 CT1 (SUTURE) ×1 IMPLANT
SUT VIC AB 3-0 CT1 27 (SUTURE) ×1
SUT VIC AB 3-0 CT1 TAPERPNT 27 (SUTURE) ×1 IMPLANT
SUT VIC AB 3-0 SH 27 (SUTURE)
SUT VIC AB 3-0 SH 27X BRD (SUTURE) IMPLANT
SUT VIC AB 4-0 KS 27 (SUTURE) ×1 IMPLANT
SYR BULB IRRIGATION 50ML (SYRINGE) IMPLANT
TOWEL OR 17X24 6PK STRL BLUE (TOWEL DISPOSABLE) ×1 IMPLANT
TRAY FOLEY W/BAG SLVR 14FR LF (SET/KITS/TRAYS/PACK) ×1 IMPLANT
WATER STERILE IRR 1000ML POUR (IV SOLUTION) ×1 IMPLANT

## 2023-09-28 NOTE — Op Note (Signed)
Debanhi Pi Calleros PROCEDURE DATE: 09/28/2023  PREOPERATIVE DIAGNOSES: Intrauterine pregnancy at [redacted]w[redacted]d weeks gestation;  prior c section and unwanted fertility  POSTOPERATIVE DIAGNOSES: The same  PROCEDURE: Repeat Low Transverse Cesarean Section with bilateral salpingectomy  SURGEON:  Dr. Jaynie Collins  ASSISTANT:  Tyler Aas, CMN. An experienced assistant was required given the standard of surgical care given the complexity of the case.  This assistant was needed for exposure, dissection, suctioning, retraction, instrument exchange, assisting with delivery with administration of fundal pressure, and for overall help during the procedure.  ANESTHESIOLOGY TEAM: Anesthesiologist: Lowella Curb, MD CRNA: Elgie Congo, CRNA  INDICATIONS: Earma Pi Blower is a 42 y.o. [phone] at [redacted]w[redacted]d here for cesarean section secondary to the indications listed under preoperative diagnoses; please see preoperative note for further details.  The risks of surgery were discussed with the patient including but were not limited to: bleeding which may require transfusion or reoperation; infection which may require antibiotics; injury to bowel, bladder, ureters or other surrounding organs; injury to the fetus; need for additional procedures including hysterectomy in the event of a life-threatening hemorrhage; formation of adhesions; placental abnormalities wth subsequent pregnancies; incisional problems; thromboembolic phenomenon and other postoperative/anesthesia complications.  The patient concurred with the proposed plan, giving informed written consent for the procedure.    FINDINGS:  Viable female infant in cephalic presentation.  Apgars as recorded Meconium stained amniotic fluid.  Intact placenta, three vessel cord.  Normal uterus, fallopian tubes and ovaries bilaterally.  ANESTHESIA: Spinal INTRAVENOUS FLUIDS: As recorded  ESTIMATED BLOOD LOSS: As recorded  URINE OUTPUT:  As recorded SPECIMENS: Placenta sent to  L&D, and tubal sections to pathology COMPLICATIONS: None immediate  PROCEDURE IN DETAIL:  The patient preoperatively received intravenous antibiotics and had sequential compression devices applied to her lower extremities.  She was then taken to the operating room where spinal anesthesia was administered.  She was then placed in a dorsal supine position with a leftward tilt, and prepped and draped in a sterile manner.  A foley catheter was placed into her bladder and attached to constant gravity.  After an adequate timeout was performed, a Pfannenstiel skin incision was made with scalpel on her preexisting scar and carried through to the underlying layer of fascia. The fascia was incised in the midline, and this incision was extended bilaterally in a blunt fashion.  The underlying rectus muscles were dissected off the fascia superiorly and inferiorly in a blunt fashion. The rectus muscles were separated in the midline and the peritoneum was entered bluntly. The fascia was incised in the midline, and this incision was extended bilaterally using the Mayo scissors.  Kocher clamps were applied to the superior aspect of the fascial incision and the underlying rectus muscles were dissected off bluntly and sharply.  A similar process was carried out on the inferior aspect of the fascial incision. The rectus muscles were separated in the midline and the peritoneum was entered bluntly. The Alexis self-retaining retractor was introduced into the abdominal cavity.  Attention was turned to the lower uterine segment where a low transverse hysterotomy was made with a scalpel and extended bilaterally bluntly.  The infant was successfully delivered, the cord was clamped and cut after one minute, and the infant was handed over to the awaiting neonatology team. Uterine massage was then administered, and the placenta delivered intact with a three-vessel cord. The uterus was then cleared of clots and debris.  The hysterotomy was  closed with 0 Chromic in a running locked  fashion, and an imbricating layer was also placed with 0 Chromic    The pelvis was cleared of all clot and debris. Hemostasis was confirmed on all surfaces. Attention was then turned to the bilateral salpingectomy. Each tube was identified, grasped with Babcock and the mesosalpinx was cross clamped with Tresa Endo, cut and double suture liagted with plain gut ties. Sections were passed off for pathology. Hemostasis was noted.  The retractor was removed.  The peritoneum and rectus muscles were closed with a 2/0 Vicryl running stitch. The fascia was then closed using 0 Vicryl in a running fashion.  The subcutaneous layer was irrigated, reapproximated with 3/0 Vircryl  interrupted stitches, and the skin was closed with a 4-0 Vicryl subcuticular stitch. The patient tolerated the procedure well. Sponge, instrument and needle counts were correct x 3.  She was taken to the recovery room in stable condition.    Nettie Elm, MD, FACOG Obstetrician & Gynecologist, [facility] for [facility], [facility]

## 2023-09-28 NOTE — Plan of Care (Signed)
  Problem: Education: Goal: Knowledge of General Education information will improve Description: Including pain rating scale, medication(s)/side effects and non-pharmacologic comfort measures Outcome: Progressing   Problem: Health Behavior/Discharge Planning: Goal: Ability to manage health-related needs will improve Outcome: Progressing   Problem: Clinical Measurements: Goal: Ability to maintain clinical measurements within normal limits will improve Outcome: Progressing Goal: Will remain free from infection Outcome: Progressing Goal: Diagnostic test results will improve Outcome: Progressing Goal: Respiratory complications will improve Outcome: Progressing Goal: Cardiovascular complication will be avoided Outcome: Progressing   Problem: Activity: Goal: Risk for activity intolerance will decrease Outcome: Progressing   Problem: Nutrition: Goal: Adequate nutrition will be maintained Outcome: Progressing   Problem: Coping: Goal: Level of anxiety will decrease Outcome: Progressing   Problem: Elimination: Goal: Will not experience complications related to bowel motility Outcome: Progressing Goal: Will not experience complications related to urinary retention Outcome: Progressing   Problem: Pain Managment: Goal: General experience of comfort will improve Outcome: Progressing   Problem: Safety: Goal: Ability to remain free from injury will improve Outcome: Progressing   Problem: Skin Integrity: Goal: Risk for impaired skin integrity will decrease Outcome: Progressing   Problem: Education: Goal: Knowledge of the prescribed therapeutic regimen will improve Outcome: Progressing Goal: Understanding of sexual limitations or changes related to disease process or condition will improve Outcome: Progressing Goal: Individualized Educational Video(s) Outcome: Progressing   Problem: Self-Concept: Goal: Communication of feelings regarding changes in body function or  appearance will improve Outcome: Progressing   Problem: Skin Integrity: Goal: Demonstration of wound healing without infection will improve Outcome: Progressing   Problem: Education: Goal: Knowledge of condition will improve Outcome: Progressing Goal: Individualized Educational Video(s) Outcome: Progressing Goal: Individualized Newborn Educational Video(s) Outcome: Progressing   Problem: Activity: Goal: Will verbalize the importance of balancing activity with adequate rest periods Outcome: Progressing Goal: Ability to tolerate increased activity will improve Outcome: Progressing   Problem: Coping: Goal: Ability to identify and utilize available resources and services will improve Outcome: Progressing   Problem: Life Cycle: Goal: Chance of risk for complications during the postpartum period will decrease Outcome: Progressing   Problem: Role Relationship: Goal: Ability to demonstrate positive interaction with newborn will improve Outcome: Progressing   Problem: Skin Integrity: Goal: Demonstration of wound healing without infection will improve Outcome: Progressing   

## 2023-09-28 NOTE — Transfer of Care (Signed)
Immediate Anesthesia Transfer of Care Note  Patient: Teresa Clark  Procedure(s) Performed: CESAREAN SECTION WITH BILATERAL TUBAL LIGATION  Patient Location: PACU  Anesthesia Type:Spinal  Level of Consciousness: awake, alert , and oriented  Airway & Oxygen Therapy: Patient Spontanous Breathing  Post-op Assessment: Report given to RN and Post -op Vital signs reviewed and stable  Post vital signs: Reviewed and stable  Last Vitals:  Vitals Value Taken Time  BP 104/64 09/28/23 1716  Temp    Pulse 66 09/28/23 1721  Resp 17 09/28/23 1721  SpO2 99 % 09/28/23 1721  Vitals shown include unfiled device data.  Last Pain:  Vitals:   09/28/23 1123  TempSrc: Oral         Complications: No notable events documented.

## 2023-09-28 NOTE — Anesthesia Procedure Notes (Signed)
Spinal  Patient location during procedure: OB Start time: 09/28/2023 4:04 PM End time: 09/28/2023 4:09 PM Reason for block: surgical anesthesia Staffing Performed: anesthesiologist  Anesthesiologist: Lowella Curb, MD Performed by: Lowella Curb, MD Authorized by: Lowella Curb, MD   Preanesthetic Checklist Completed: patient identified, IV checked, risks and benefits discussed, surgical consent, monitors and equipment checked, pre-op evaluation and timeout performed Spinal Block Patient position: sitting Prep: DuraPrep and site prepped and draped Patient monitoring: heart rate, cardiac monitor, continuous pulse ox and blood pressure Approach: midline Location: L3-4 Injection technique: single-shot Needle Needle type: Pencan  Needle gauge: 24 G Needle length: 10 cm Assessment Sensory level: T4 Events: CSF return

## 2023-09-28 NOTE — Progress Notes (Signed)
[employer] interpreter [phone] utilized at bedside in OR throughout entirety of procedure.

## 2023-09-28 NOTE — H&P (Signed)
Teresa Clark is a 42 y.J.[id number]  female  IUP 39 0/7 weeks presenting for RLTCS with BTL Prenatal care unremarkable except for AMA, prior c section x 2 and fetal sex chromosome abnormality ( pt declined amnio).  OB History     Gravida  5   Para  4   Term  3   Preterm  1   AB      Living  4      SAB      IAB      Ectopic      Multiple  1   Live Births  5        Obstetric Comments  One child deceased around 40 1/40 years of age per patient.          Past Medical History:  Diagnosis Date   Gestational diabetes    Normal postpartum 2 hr GTT   Influenza B 01/21/2019   Past Surgical History:  Procedure Laterality Date   CESAREAN SECTION     CESAREAN SECTION MULTI-GESTATIONAL N/A 01/21/2019   Procedure: CESAREAN SECTION MULTI-GESTATIONAL;  Surgeon: Levie Heritage, DO;  Location: [facility];  Service: Obstetrics;  Laterality: N/A;   NO PAST SURGERIES     Family History: Family history is unknown by patient. Social History:  reports that she has never smoked. She has never used smokeless tobacco. She reports that she does not drink alcohol and does not use drugs.     Maternal Diabetes: No Genetic Screening: Abnormal:  Results: Other: Maternal Ultrasounds/Referrals: Normal Fetal Ultrasounds or other Referrals:  None Maternal Substance Abuse:  No Significant Maternal Medications:  None Significant Maternal Lab Results:  None Number of Prenatal Visits:greater than 3 verified prenatal visits Maternal Vaccinations: NA Other Comments:  None  Review of Systems  Constitutional: Negative.   Respiratory: Negative.    Cardiovascular: Negative.   Gastrointestinal: Negative.   Genitourinary: Negative.    History   Blood pressure 106/79, pulse 77, temperature 97.8 F (36.6 C), temperature source Oral, resp. rate 16, last menstrual period 12/29/2022, unknown if currently breastfeeding. Exam Physical Exam Constitutional:      Appearance: Normal appearance.   Cardiovascular:     Rate and Rhythm: Normal rate and regular rhythm.  Pulmonary:     Effort: Pulmonary effort is normal.     Breath sounds: Normal breath sounds.  Abdominal:     General: Bowel sounds are normal.     Palpations: Abdomen is soft.     Comments: Gravid FH=GA  Genitourinary:    Comments: Deferred    Prenatal labs: ABO, Rh: --/--/O POS (10/09 1037) Antibody: NEG (10/09 1037) Rubella: 5.41 (04/02 0921) RPR: NON REACTIVE (10/09 1037)  HBsAg: Negative (04/02 0921)  HIV: Non Reactive (07/29 0850)  GBS: Negative/-- (09/16 1111)   Assessment/Plan: IUP 39 0/7 weeks Prior c section x 2, desires for repeat AMA Abnormal fetal sex chromosome, declined amnio Unwanted fertility, BTL papers 07/16/23 Language Barrier   The risks of cesarean section discussed with the patient included but were not limited to: bleeding which may require transfusion or reoperation; infection which may require antibiotics; injury to bowel, bladder, ureters or other surrounding organs; injury to the fetus; need for additional procedures including hysterectomy in the event of a life-threatening hemorrhage; placental abnormalities wth subsequent pregnancies, incisional problems, thromboembolic phenomenon and other postoperative/anesthesia complications. The patient concurred with the proposed plan, giving informed written consent for the procedure.   Anesthesia and OR aware. Preoperative prophylactic antibiotics and  SCDs ordered on call to the OR.  To OR when ready.   Patient desires permanent sterilization.  Other reversible forms of contraception were discussed with patient; she declines all other modalities. Risks of procedure discussed with patient including but not limited to: risk of regret, permanence of method, bleeding, infection, injury to surrounding organs and need for additional procedures.  Failure risk of 1-2 % with increased risk of ectopic gestation if pregnancy occurs was also discussed with  patient.  Patient verbalized understanding of these risks and wants to proceed with sterilization.  Medicaid papers signed 07/16/23   Video Interrupter used for today's visit  Hermina Staggers 09/28/2023, 11:30 AM

## 2023-09-28 NOTE — Progress Notes (Signed)
ANM interpreter [phone] utilized at bedside

## 2023-09-28 NOTE — Discharge Summary (Signed)
Postpartum Discharge Summary  Date of Service updated    Patient Name: Teresa Clark Favorite DOB: 1981/08/07 MRN: [id number]  Date of admission: 09/28/2023 Delivery date:09/28/2023 Delivering provider: Hermina Staggers Date of discharge: 09/30/2023  Admitting diagnosis: Post-operative state [Z98.890] History of cesarean delivery [Z98.891] Intrauterine pregnancy: [redacted]w[redacted]d     Secondary diagnosis:  Principal Problem:   Post-operative state Active Problems:   History of cesarean delivery  Additional problems: NA    Discharge diagnosis: Term Pregnancy Delivered                                              Post partum procedures: NA Augmentation: N/A Complications: None  Hospital course: [name] was admitted for repeat LTCS with BTL. See admit H & P for additional information.  She underwent RLTCS with bilateral salpingectomy without problems. See OP note for additional information.  Post OP course was unremarkable. Pt progressed to ambulating, voiding, tolerating diet, passing flatus and good oral pain control.  Magnesium Sulfate received: No BMZ received: No Rhophylac:N/A MMR:No T-DaP:Given prenatally Flu: No RSV Vaccine received: No Transfusion:No  Immunizations received: Immunization History  Administered Date(s) Administered   Influenza Split 09/14/2015   Influenza, Seasonal, Injecte, Preservative Fre 09/30/2023   Influenza,inj,Quad PF,6+ Mos 10/10/2016, 10/05/2017, 10/08/2019, 09/22/2020   MMR 01/22/2019   PFIZER(Purple Top)SARS-COV-2 Vaccination 05/13/2020, 06/07/2020   Tdap 11/27/2018, 01/22/2019, 07/16/2023    Physical exam  Vitals:   09/29/23 1500 09/29/23 2135 09/30/23 0518 09/30/23 1342  BP: 111/71 113/70 118/71 104/69  Pulse: 63 71 78 72  Resp: 18   18  Temp: 97.7 F (36.5 C) 97.9 F (36.6 C) 98.2 F (36.8 C) 98.6 F (37 C)  TempSrc: Oral Oral Oral Oral  SpO2:  99% 98% 99%  Weight:      Height:       General: alert Lochia: appropriate Uterine Fundus:  firm Incision: Healing well with no significant drainage DVT Evaluation: No evidence of DVT seen on physical exam. Labs: Lab Results  Component Value Date   WBC 15.9 (H) 09/29/2023   HGB 9.4 (L) 09/29/2023   HCT 28.4 (L) 09/29/2023   MCV 96.6 09/29/2023   PLT 217 09/29/2023      Latest Ref Rng & Units 09/29/2023    4:48 AM  CMP  Creatinine 0.44 - 1.00 mg/dL 7.82    Edinburgh Score:    09/29/2023    7:54 AM  Edinburgh Postnatal Depression Scale Screening Tool  I have been able to laugh and see the funny side of things. 1  I have looked forward with enjoyment to things. 1  I have blamed myself unnecessarily when things went wrong. 2  I have been anxious or worried for no good reason. 2  I have felt scared or panicky for no good reason. 0  Things have been getting on top of me. 1  I have been so unhappy that I have had difficulty sleeping. 0  I have felt sad or miserable. 1  I have been so unhappy that I have been crying. 0  The thought of harming myself has occurred to me. 0  Edinburgh Postnatal Depression Scale Total 8   Edinburgh Postnatal Depression Scale Total: 8   After visit meds:  Allergies as of 09/30/2023   No Known Allergies      Medication List  STOP taking these medications    cephALEXin 500 MG capsule Commonly known as: KEFLEX   cyclobenzaprine 5 MG tablet Commonly known as: FLEXERIL       TAKE these medications    acetaminophen 500 MG tablet Commonly known as: TYLENOL Take 2 tablets (1,000 mg total) by mouth every 6 (six) hours. What changed:  how much to take when to take this reasons to take this   ibuprofen 600 MG tablet Commonly known as: ADVIL Take 1 tablet (600 mg total) by mouth every 6 (six) hours.   oxyCODONE 5 MG immediate release tablet Commonly known as: Oxy IR/ROXICODONE Take 1-2 tablets (5-10 mg total) by mouth every 4 (four) hours as needed for moderate pain.   PrePLUS 27-1 MG Tabs Take 1 tablet by mouth  daily.        Discharge home in stable condition Infant Feeding: Breast Infant Disposition:home with mother Discharge instruction: per After Visit Summary and Postpartum booklet. Activity: Advance as tolerated. Pelvic rest for 6 weeks.  Diet: routine diet Future Appointments:No future appointments. Follow up Visit: Please schedule this patient for a In person postpartum visit in 4 weeks with the following provider: RN. Additional Postpartum F/U:Incision check 1 week  Low risk pregnancy complicated by:  AMA Delivery mode:  C-Section, Low Transverse Anticipated Birth Control:  BTL done [facility]   09/30/2023 Milas Hock, MD

## 2023-09-28 NOTE — Anesthesia Preprocedure Evaluation (Signed)
Anesthesia Evaluation  Patient identified by MRN, date of birth, ID band Patient awake    Reviewed: Allergy & Precautions, NPO status , Patient's Chart, lab work & pertinent test results  Airway Mallampati: I       Dental no notable dental hx. (+) Teeth Intact   Pulmonary  Influenza A  Tachypnea  breath sounds clear to auscultation       Cardiovascular negative cardio ROS  Rhythm:Regular Rate:Tachycardia     Neuro/Psych negative neurological ROS  negative psych ROS   GI/Hepatic negative GI ROS, Neg liver ROS,,,  Endo/Other  diabetes    Renal/GU negative Renal ROS     Musculoskeletal negative musculoskeletal ROS (+)    Abdominal Normal abdominal exam  (+)   Peds  Hematology negative hematology ROS (+)   Anesthesia Other Findings   Reproductive/Obstetrics (+) Pregnancy                              Anesthesia Physical Anesthesia Plan  ASA: II  Anesthesia Plan: Spinal   Post-op Pain Management:    Induction:   PONV Risk Score and Plan: 3 and Ondansetron, Dexamethasone and Scopolamine patch - Pre-op  Airway Management Planned: Natural Airway and Nasal Cannula  Additional Equipment:   Intra-op Plan:   Post-operative Plan:   Informed Consent: I have reviewed the patients History and Physical, chart, labs and discussed the procedure including the risks, benefits and alternatives for the proposed anesthesia with the patient or authorized representative who has indicated his/her understanding and acceptance.       Plan Discussed with: CRNA and Surgeon  Anesthesia Plan Comments:          Anesthesia Quick Evaluation

## 2023-09-28 NOTE — Anesthesia Postprocedure Evaluation (Signed)
Anesthesia Post Note  Patient: Teresa Clark  Procedure(s) Performed: CESAREAN SECTION WITH BILATERAL TUBAL LIGATION     Patient location during evaluation: PACU Anesthesia Type: Spinal Level of consciousness: oriented and awake and alert Pain management: pain level controlled Vital Signs Assessment: post-procedure vital signs reviewed and stable Respiratory status: spontaneous breathing, respiratory function stable and nonlabored ventilation Cardiovascular status: blood pressure returned to baseline and stable Postop Assessment: no headache, no backache, no apparent nausea or vomiting, spinal receding and patient able to bend at knees Anesthetic complications: no   No notable events documented.  Last Vitals:  Vitals:   09/28/23 1800 09/28/23 1815  BP: 112/84 116/74  Pulse: 72 65  Resp: 18 (!) 22  Temp:    SpO2: 98% 96%    Last Pain:  Vitals:   09/28/23 1815  TempSrc:   PainSc: 0-No pain   Pain Goal:    LLE Motor Response: Purposeful movement (09/28/23 1815) LLE Sensation: Tingling (09/28/23 1815) RLE Motor Response: Purposeful movement (09/28/23 1815) RLE Sensation: Tingling (09/28/23 1815) L Sensory Level: T12-Inguinal (groin) region (09/28/23 1815) R Sensory Level: T12-Inguinal (groin) region (09/28/23 1815) Epidural/Spinal Function Cutaneous sensation: Able to Discern Pressure (09/28/23 1815), Patient able to flex knees: Yes (09/28/23 1815), Patient able to lift hips off bed: Yes (09/28/23 1815), Back pain beyond tenderness at insertion site: No (09/28/23 1815), Progressively worsening motor and/or sensory loss: No (09/28/23 1815), Bowel and/or bladder incontinence post epidural: No (09/28/23 1815)  Pascual Mantel A.

## 2023-09-29 ENCOUNTER — Encounter (HOSPITAL_COMMUNITY): Payer: Self-pay | Admitting: Obstetrics and Gynecology

## 2023-09-29 LAB — CBC
HCT: 28.4 % — ABNORMAL LOW (ref 36.0–46.0)
Hemoglobin: 9.4 g/dL — ABNORMAL LOW (ref 12.0–15.0)
MCH: 32 pg (ref 26.0–34.0)
MCHC: 33.1 g/dL (ref 30.0–36.0)
MCV: 96.6 fL (ref 80.0–100.0)
Platelets: 217 10*3/uL (ref 150–400)
RBC: 2.94 MIL/uL — ABNORMAL LOW (ref 3.87–5.11)
RDW: 13.2 % (ref 11.5–15.5)
WBC: 15.9 10*3/uL — ABNORMAL HIGH (ref 4.0–10.5)
nRBC: 0 % (ref 0.0–0.2)

## 2023-09-29 LAB — CREATININE, SERUM
Creatinine, Ser: 1.01 mg/dL — ABNORMAL HIGH (ref 0.44–1.00)
GFR, Estimated: >60 mL/min (ref >60)

## 2023-09-29 MED ORDER — INFLUENZA VIRUS VACC SPLIT PF (FLUZONE) 0.5 ML IM SUSY
0.5000 mL | PREFILLED_SYRINGE | INTRAMUSCULAR | Status: AC
  Administered 2023-09-30: 0.5 mL via INTRAMUSCULAR
  Filled 2023-09-29: qty 0.5

## 2023-09-29 NOTE — Progress Notes (Addendum)
POSTPARTUM PROGRESS NOTE  POD #1  Subjective:  Teresa Clark is a 41 y.o. [id number] s/p elective rLTCS at [redacted]w[redacted]d. No acute events overnight. She reports she is doing well. She denies any problems with ambulating, voiding or po intake. Denies nausea or vomiting. She has passed flatus. Pain is well controlled.  Lochia is minimal.  Objective: Blood pressure 106/72, pulse 66, temperature 98.3 F (36.8 C), temperature source Oral, resp. rate 18, height 4' 8.69" (1.44 m), weight 65.4 kg, last menstrual period 12/29/2022, SpO2 98%, unknown if currently breastfeeding.  Physical Exam:  General: alert, cooperative and no distress Chest: no respiratory distress Heart: regular rate, distal pulses intact Uterine Fundus: firm, appropriately tender DVT Evaluation: No calf swelling or tenderness Extremities: No edema Skin: warm, dry; incision clean/dry/intact w/ honeycomb dressing in place  Recent Labs    09/28/23 1923 09/29/23 0448  HGB 11.5* 9.4*  HCT 35.1* 28.4*    Assessment/Plan: Teresa Clark is a 42 y.o. [id number] s/p elective rLTCS at [redacted]w[redacted]d.  POD#1 - Doing welll; pain is well controlled. H/H appropriate  Routine postpartum care  OOB, ambulated  Lovenox for VTE prophylaxis Mild Anemia: asymptomatic  Start po ferrous sulfate daily   Continue PNVs  Contraception: BTL  Feeding: Breast  Dispo: Plan for discharge on post operative day 3.   LOS: 1 day   This patient's plan of care has been discussed with the OB fellow Dr. Leanora Cover. Please see attestation.    Charma Igo, MD 09/29/2023, 8:42 AM   GME ATTESTATION:  Evaluation and management procedures were performed by the [facility] Medicine Resident under my supervision. I was immediately available for direct supervision, assistance and direction throughout this encounter.  I also confirm that I have verified the information documented in the resident's note, and that I have also personally reperformed the pertinent components of the  physical exam and all of the medical decision making activities.  I have also made any necessary editorial changes.  Wyn Forster, MD OB Fellow, Faculty Practice [facility], Center for [facility] 09/29/2023 5:29 PM

## 2023-09-29 NOTE — Lactation Note (Signed)
This note was copied from a baby's chart. Lactation Consultation Note  Patient Name: Teresa Clark FIEPP'I Date: 09/29/2023 Age:40 hours Reason for consult: Initial assessment;Term Mom's daughter at bedside interpreting for mom. Mom stated she doesn't have milk yet and is going to give formula until her milk comes in. Mom has only put baby to the breast once and has formula fed the rest. Mom pulled her nipple out demonstrating hand expression w/some colostrum noted. Mom stated her breast are soft and has no milk yet. Explain colostrum comes first because it is important. Encouraged to put baby to the breast to BF before giving formula. Mom stated she is good and doesn't need any assistance w/BF. Encouraged mom to call for assistance if needed. Mom stated she is good.    Maternal Data Does the patient have breastfeeding experience prior to this delivery?: Yes How long did the patient breastfeed?: 1-2 yrs each  Feeding Mother's Current Feeding Choice: Breast Milk and Formula Nipple Type: Slow - flow  LATCH Score       Type of Nipple: Everted at rest and after stimulation  Comfort (Breast/Nipple): Soft / non-tender         Lactation Tools Discussed/Used    Interventions Interventions: Breast feeding basics reviewed;Hand express;LC Services brochure  Discharge    Consult Status Consult Status: Complete    Teresa Clark 09/29/2023, 9:31 PM

## 2023-09-29 NOTE — Lactation Note (Signed)
This note was copied from a baby's chart. Lactation Consultation Note  Patient Name: Teresa Clark Today's Date: 09/29/2023 Age:40 hours   P5- LC to consult with MOB, but when LC entered room MOB was asleep. Lactation team will attempt to try again at a later time.  Dema Severin BS, IBCLC 09/29/2023, 6:01 PM

## 2023-09-29 NOTE — Progress Notes (Signed)
MOB requesting an "out-of-school excuse" form for her daughter who has been spending the night for the past 2 nights. Patient's daughter did not attend school this Friday, 09/28/23.

## 2023-09-29 NOTE — Progress Notes (Signed)
MOB requesting to go home tomorrow (Sunday) if possible. She has not been complaining of any pain. Her daughter also has a dental appointment on Monday morning and the family was hoping on still having the daughter attend the appointment.

## 2023-09-30 MED ORDER — IBUPROFEN 600 MG PO TABS: 600.0000 mg | ORAL_TABLET | Freq: Four times a day (QID) | ORAL | 0 refills | Status: DC

## 2023-09-30 MED ORDER — ACETAMINOPHEN 500 MG PO TABS: 1000.0000 mg | ORAL_TABLET | Freq: Four times a day (QID) | ORAL | 0 refills | Status: DC

## 2023-09-30 MED ORDER — OXYCODONE HCL 5 MG PO TABS: 5.0000 mg | ORAL_TABLET | ORAL | 0 refills | Status: DC | PRN

## 2023-09-30 NOTE — Progress Notes (Signed)
CSW acknowledged consult and met with MOB at bedside. CSW attempted to utilize [employer] language [employer] Burmese interpreter; however no Burmese interpreter was available. The system then proceeded to call [employer] for a Burmese interpreter and no one answered. MOB was accompanied by her daughter Gomm He). MOB's daughter reported that she can interpret for MOB, MOB agreed. CSW inquired about MOB's needs for supplies for infant. MOB reported that she has an old car seat that has been used a lot and is interested in another car seat. CSW informed MOB that the hospital is currently out of car seats and encouraged MOB to follow up with her OBGYN provider's office to see if any resources were available. CSW asked if MOB had a safe appropriate car seat to take infant home, MOB reported yes that FOB can bring it. CSW inquired about additional needs. MOB reported that she needs a baby bed and explained that she has a small bed that sits directly on the ground and expressed concerns about her young children having access to baby while in the bed. CSW explained that the hospital is able to provide a pack and play and showed MOB a picture. MOB reported that she is interested. CSW agreed to provide and denied any additional needs. CSW agreed to return.   CSW returned to room and delivered pack and play. MOB's daughter present and interpreted. CSW obtained MOB's signature on cribs for kids hold harmless agreement. MOB thanked CSW and denied any additional needs.  Celso Sickle, LCSW Clinical Social Worker [facility] Cell#: [phone]

## 2023-10-02 ENCOUNTER — Encounter: Payer: Medicaid Other | Admitting: Family Medicine

## 2023-10-02 LAB — SURGICAL PATHOLOGY

## 2023-10-02 NOTE — Addendum Note (Signed)
Addendum  created 10/02/23 1023 by Lowella Curb, MD   Attestation recorded in [place], Intraprocedure Attestations filed

## 2023-10-04 ENCOUNTER — Ambulatory Visit: Payer: Self-pay

## 2023-10-04 NOTE — Telephone Encounter (Signed)
  Chief Complaint: Question regarding post c-section care Symptoms: none Frequency:  Pertinent Negatives: Patient denies  Disposition: [] ED /[] Urgent Care (no appt availability in office) / [] Appointment(In office/virtual)/ []  [place] [facility]/ [] Home Care/ [] Refused Recommended Disposition /[] [place] [facility]/ [x]  Follow-up with PCP Additional Notes: Pt had questions regarding when she should remove her bandage. Advised to review discharge instructions and call Ob/Gyn for follow up appt and questions. Interpreter unable to help with pt. Pt's older daughter was able to help communicate.    Summary: Pt has concerns about c-section   Pt stated that she had a c-section and she would like a call back as she has some concerns. Cb# [phone]     Reason for Disposition  General activity, questions about  Answer Assessment - Initial Assessment Questions Pt has questions regarding bandage removal and post-partum c-section.  Protocols used: Postpartum - C-section [facility]

## 2023-10-11 ENCOUNTER — Telehealth: Payer: Self-pay | Admitting: *Deleted

## 2023-10-11 NOTE — Telephone Encounter (Signed)
Voicemail message left yesterday on nurse line from Albertina Parr RN - [facility] RN [phone]). She stated that she has seen pt for a home visit and has some concerns. Pt had C/S on 10/11 and has not been scheduled for incision check or PP visit. She assessed the incision and found steri-strips in place and incision appears to be healing well. Also, pt is having symptoms of UTI - with lower flank pain. I called pt ([phone]) w/[employer] [phone] and she did not answer. A message was left stating that I am calling regarding concerns from her nurse yesterday. I will attempt to reach her later today. I then asked the [employer] interpreter to call pt's husband Saw [phone]) and he also did not answer. Message was left for him as well. Pt needs lab appt today or tomorrow in order to submit urine specimen for testing. Also, pt needs to be informed of nurse visit for Monday 10/28 @ 2:30 pm for incision check.

## 2023-10-15 ENCOUNTER — Ambulatory Visit: Payer: Medicaid Other

## 2023-10-15 NOTE — Telephone Encounter (Signed)
Patient called to reschedule visit for today due to needing to pick up her children. She spoke with Esthefany with front office and rescheduled appt to 10/18/23.

## 2023-10-18 ENCOUNTER — Other Ambulatory Visit: Payer: Self-pay

## 2023-10-18 ENCOUNTER — Ambulatory Visit: Payer: Medicaid Other

## 2023-10-18 VITALS — BP 103/74 | HR 68

## 2023-10-18 DIAGNOSIS — Z5189 Encounter for other specified aftercare: Secondary | ICD-10-CM

## 2023-10-18 NOTE — Progress Notes (Signed)
Pt here today for incision check s/p c-section on 09/28/23.  Pt reports mild pain and no incisional concerns.  Observed incision- well approximated, no odor, no drainage, no edema, and no erythema,  Pt advised to continue to monitor for sx's of infection, contact the office with concerns, and that will evaluate at her pp visit scheduled on 10/29/23.  Pt verbalized understanding with on further questions.   Leonette Nutting  10/18/23

## 2023-10-29 ENCOUNTER — Other Ambulatory Visit: Payer: Self-pay

## 2023-10-29 ENCOUNTER — Encounter: Payer: Self-pay | Admitting: Obstetrics and Gynecology

## 2023-10-29 ENCOUNTER — Ambulatory Visit: Payer: Medicaid Other | Admitting: Obstetrics and Gynecology

## 2023-10-29 DIAGNOSIS — Z9851 Tubal ligation status: Secondary | ICD-10-CM

## 2023-10-29 NOTE — Progress Notes (Signed)
Post Partum Visit Note  Teresa Clark is a 42 y.o. [id number] female who presents for a postpartum visit. She is  4.3  weeks postpartum following a repeat cesarean section.  I have fully reviewed the prenatal and intrapartum course. The delivery was at 39 gestational weeks.  Anesthesia: epidural. Postpartum course has been good. Baby is doing well. Baby is feeding by breast and bottle. Bleeding staining only. Bowel function is normal. Bladder function is normal. Patient is not sexually active. Contraception method is tubal ligation. Postpartum depression screening: negative.   The pregnancy intention screening data noted above was reviewed. Potential methods of contraception were discussed. The patient elected to proceed with s/p BTL    Edinburgh Postnatal Depression Scale - 10/29/23 0925       Edinburgh Postnatal Depression Scale:  In the Past 7 Days   I have been able to laugh and see the funny side of things. 0    I have looked forward with enjoyment to things. 0    I have blamed myself unnecessarily when things went wrong. 0    I have been anxious or worried for no good reason. 0    I have felt scared or panicky for no good reason. 0    Things have been getting on top of me. 0    I have been so unhappy that I have had difficulty sleeping. 0    I have felt sad or miserable. 0    I have been so unhappy that I have been crying. 0    The thought of harming myself has occurred to me. 0    Edinburgh Postnatal Depression Scale Total 0             Health Maintenance Due  Topic Date Due   COVID-19 Vaccine (3 - 2023-24 season) 08/19/2023    The following portions of the patient's history were reviewed and updated as appropriate: allergies, current medications, past family history, past medical history, past social history, past surgical history, and problem list.  Review of Systems Pertinent items are noted in HPI.  Objective:  BP 116/80   Pulse 70   LMP 12/29/2022    Breastfeeding Yes    General:  alert and cooperative   Breasts:  not indicated  Lungs: Normal effort  Heart:  Normal rate   Abdomen: soft    Wound Well approximated  GU exam:  not indicated       Assessment:  1. Postpartum examination following cesarean delivery Doing well overall, recovering well   2. History of bilateral tubal ligation    Plan:   Essential components of care per ACOG recommendations:  1.  Mood and well being: Patient with negative depression screening today. Reviewed local resources for support.  - Patient tobacco use? No.   - hx of drug use? No.    2. Infant care and feeding:  -Patient currently breastmilk feeding? Breast and bottle   -Social determinants of health (SDOH) reviewed in EPIC. No concerns  3. Sexuality, contraception and birth spacing - Patient does not want a pregnancy in the next year.  Desired family size is 5 children.  - Reviewed reproductive life planning. Reviewed contraceptive methods based on pt preferences and effectiveness.  Patient desired s/p BTL  - Discussed birth spacing of 18 months  4. Sleep and fatigue -Encouraged family/partner/community support of 4 hrs of uninterrupted sleep to help with mood and fatigue  5. Physical Recovery  - Discussed patients delivery and  complications. She describes her labor as good. - Patient had a C-section repeat; no problems after deliver. Patient had a  none  laceration. Perineal healing reviewed. Patient expressed understanding - Patient has urinary incontinence? No. - Patient is safe to resume physical and sexual activity  6.  Health Maintenance - HM due items addressed Yes - Last pap smear  Diagnosis  Date Value Ref Range Status  03/27/2023   Final   - Negative for intraepithelial lesion or malignancy (NILM)   Pap smear not done at today's visit.  -Breast Cancer screening indicated? No.   7. Chronic Disease/Pregnancy Condition follow up: None  - PCP follow up  Albertine Grates, FNP Center for [employer], [facility]

## 2023-11-27 ENCOUNTER — Telehealth: Payer: Self-pay | Admitting: Family Medicine

## 2023-11-27 ENCOUNTER — Ambulatory Visit: Payer: Self-pay | Admitting: *Deleted

## 2023-11-27 NOTE — Telephone Encounter (Signed)
  Chief Complaint: Fever and chills and a cold hip intermittently since having a C section on Oct. 11, 2024.   She has a call in to her OB-GYN dr.   Waiting for their nurse to call back. Symptoms: Fever, chills, and a cold hip Frequency: Since C section was done Pertinent Negatives: Patient denies any other symptoms like sore throat, runny nose, etc. Disposition: [] ED /[] Urgent Care (no appt availability in office) / [] Appointment(In office/virtual)/ []  [place]/ [x] Home Care/ [] Refused Recommended Disposition /[] [place] [facility]/ []  Follow-up with PCP Additional Notes: She had not taken any Tylenol for the fever so I let her know she could do that.  She is getting advice from OB-GYN dr when they call her back.       Her friend interpreted for her for this call.

## 2023-11-27 NOTE — Telephone Encounter (Signed)
Message from Tolu T sent at 11/27/2023  3:18 PM EST  Summary: fever and chills   Teresa patients friend called stated she is experiencing fever and chills. No appts until the end of Jan as patient was not able to make appt today. Please f/u with patient on friends phone for translation          Call History  Contact Date/Time Type Contact Phone/Fax User  11/27/2023 03:15 PM EST Phone (Incoming) Ceex Haci, Teresa (Friend) [phone] [place] Jester   Reason for Disposition  [1] Fever comes and goes (intermittent) AND [2] lasts > 3 weeks    She had a C section Oct. 21, 2024.    She has a call into her OB-GYN dr since this all started after having the C section.   The fever, chills and cold hip.  Answer Assessment - Initial Assessment Questions 1. TEMPERATURE: "What is the most recent temperature?"  "How was it measured?"     I returned the call to pt's friend Teresa Clark.    She got the pt on the phone after telling me a little about what is going on.     Teresa Clark called me yesterday and told me she is having fever and chills.    One of her hips is cold.   No falling.   Hip cold since she had a C section.   I called her OB-GYN and they are supposed to call back.  So she is going to talk with OB-GYN about her hip situation since it's associated with her C section.  She is waiting for a call back from them. Teresa Clark put Teresa Clark on the line and interpreted for her.     2. ONSET: "When did the fever start?"      She is feeling cold and shivering.     She has a blanket and heater on.   I asked if she had a warm place with heat to stay since the weather has been so cold lately.   It only happens since her C section.    C section was done Oct. 11, 2024.    3. CHILLS: "Do you have chills?" If yes: "How bad are they?"  (e.g., none, mild, moderate, severe)   - NONE: no chills   - MILD: feeling cold   - MODERATE: feeling very cold, some shivering (feels better under a thick blanket)   - SEVERE: feeling  extremely cold with shaking chills (general body shaking, rigors; even under a thick blanket)      Mild  She's going to talk with her OB GYN about the chills and cold hip since all this started since the C section.   4. OTHER SYMPTOMS: "Do you have any other symptoms besides the fever?"  (e.g., abdomen pain, cough, diarrhea, earache, headache, sore throat, urination pain)     See above 5. CAUSE: If there are no symptoms, ask: "What do you think is causing the fever?"      These symptoms have been going on since her C section.    6. CONTACTS: "Does anyone else in the family have an infection?"     Not asked since related to her C section  7. TREATMENT: "What have you done so far to treat this fever?" (e.g., medications)     Nothing   Has not taken any Tylenol 8. IMMUNOCOMPROMISE: "Do you have of the following: diabetes, HIV positive, splenectomy, cancer chemotherapy, chronic steroid treatment, transplant patient, etc."  Not asked 9. PREGNANCY: "Is there any chance you are pregnant?" "When was your last menstrual period?"     She just had a C section Oct. 11, 2024. 10. TRAVEL: "Have you traveled out of the country in the last month?" (e.g., travel history, exposures)       Not asked  Protocols used: [facility]

## 2023-11-27 NOTE — Telephone Encounter (Signed)
Patient had her tubes tied and is spotting, would like a nurse to call back

## 2023-11-27 NOTE — Telephone Encounter (Signed)
I called [place] With [employer] [phone] and at first sounded as if call answered but no one spoke. We called back and reached voicemail and left message we are returning your call. We also called her contact number and left a message we are trying to reach [place] and you are listed as contact; please have her call office. Nancy Fetter

## 2024-06-19 ENCOUNTER — Other Ambulatory Visit: Payer: Self-pay

## 2024-06-19 ENCOUNTER — Emergency Department (HOSPITAL_COMMUNITY)
Admission: EM | Admit: 2024-06-19 | Discharge: 2024-06-19 | Disposition: A | Discharge status: Home / Self Care | Attending: Emergency Medicine | Admitting: Emergency Medicine

## 2024-06-19 ENCOUNTER — Emergency Department (HOSPITAL_COMMUNITY)

## 2024-06-19 ENCOUNTER — Encounter (HOSPITAL_COMMUNITY): Payer: Self-pay | Admitting: Emergency Medicine

## 2024-06-19 DIAGNOSIS — R1010 Upper abdominal pain, unspecified: Secondary | ICD-10-CM | POA: Diagnosis not present

## 2024-06-19 DIAGNOSIS — D72829 Elevated white blood cell count, unspecified: Secondary | ICD-10-CM | POA: Insufficient documentation

## 2024-06-19 DIAGNOSIS — R1011 Right upper quadrant pain: Secondary | ICD-10-CM | POA: Diagnosis not present

## 2024-06-19 LAB — URINALYSIS, ROUTINE W REFLEX MICROSCOPIC
Bilirubin Urine: NEGATIVE
Glucose, UA: NEGATIVE mg/dL
Ketones, ur: NEGATIVE mg/dL
Nitrite: NEGATIVE
Protein, ur: NEGATIVE mg/dL
Specific Gravity, Urine: 1.008 (ref 1.005–1.030)
pH: 6 (ref 5.0–8.0)

## 2024-06-19 LAB — COMPREHENSIVE METABOLIC PANEL WITH GFR
ALT: 24 U/L (ref 0–44)
AST: 23 U/L (ref 15–41)
Albumin: 4.1 g/dL (ref 3.5–5.0)
Alkaline Phosphatase: 60 U/L (ref 38–126)
Anion gap: 11 (ref 5–15)
BUN: 11 mg/dL (ref 6–20)
CO2: 21 mmol/L — ABNORMAL LOW (ref 22–32)
Calcium: 11.2 mg/dL — ABNORMAL HIGH (ref 8.9–10.3)
Chloride: 104 mmol/L (ref 98–111)
Creatinine, Ser: 0.82 mg/dL (ref 0.44–1.00)
GFR, Estimated: >60 mL/min (ref >60)
Glucose, Bld: 117 mg/dL — ABNORMAL HIGH (ref 70–99)
Potassium: 3.6 mmol/L (ref 3.5–5.1)
Sodium: 136 mmol/L (ref 135–145)
Total Bilirubin: 1.1 mg/dL (ref 0.0–1.2)
Total Protein: 7.4 g/dL (ref 6.5–8.1)

## 2024-06-19 LAB — CBC
HCT: 39.6 % (ref 36.0–46.0)
Hemoglobin: 13 g/dL (ref 12.0–15.0)
MCH: 31.2 pg (ref 26.0–34.0)
MCHC: 32.8 g/dL (ref 30.0–36.0)
MCV: 95 fL (ref 80.0–100.0)
Platelets: 270 10*3/uL (ref 150–400)
RBC: 4.17 MIL/uL (ref 3.87–5.11)
RDW: 11.9 % (ref 11.5–15.5)
WBC: 10.8 10*3/uL — ABNORMAL HIGH (ref 4.0–10.5)
nRBC: 0 % (ref 0.0–0.2)

## 2024-06-19 LAB — HCG, SERUM, QUALITATIVE: Preg, Serum: NEGATIVE

## 2024-06-19 LAB — LIPASE, BLOOD: Lipase: 37 U/L (ref 11–51)

## 2024-06-19 MED ORDER — OMEPRAZOLE 20 MG PO CPDR: 20.0000 mg | DELAYED_RELEASE_CAPSULE | Freq: Two times a day (BID) | ORAL | 0 refills | Status: DC

## 2024-06-19 MED ORDER — FAMOTIDINE 20 MG PO TABS: 20.0000 mg | ORAL_TABLET | Freq: Two times a day (BID) | ORAL | 0 refills | Status: DC

## 2024-06-19 MED ORDER — FAMOTIDINE IN NACL 20-0.9 MG/50ML-% IV SOLN
20.0000 mg | Freq: Once | INTRAVENOUS | Status: AC
  Administered 2024-06-19: 20 mg via INTRAVENOUS
  Filled 2024-06-19: qty 50

## 2024-06-19 MED ORDER — ALUM & MAG HYDROXIDE-SIMETH 200-200-20 MG/5ML PO SUSP
30.0000 mL | Freq: Once | ORAL | Status: AC
  Administered 2024-06-19: 30 mL via ORAL
  Filled 2024-06-19: qty 30

## 2024-06-19 MED ORDER — PANTOPRAZOLE SODIUM 40 MG IV SOLR
40.0000 mg | Freq: Once | INTRAVENOUS | Status: AC
  Administered 2024-06-19: 40 mg via INTRAVENOUS
  Filled 2024-06-19: qty 10

## 2024-06-19 NOTE — ED Notes (Signed)
Pt to [facility] with transport

## 2024-06-19 NOTE — ED Triage Notes (Addendum)
 Interpreter used for triage  Patient complains of abdominal pain x2 months but worsening in the past couple days. Minimal relief with tylenol . Reports only having small amounts of stool in bowel movements. Last BM 2 days ago.

## 2024-06-19 NOTE — ED Provider Notes (Signed)
 [place] EMERGENCY DEPARTMENT AT [facility] Provider Note   CSN: [id number] Arrival date & time: 06/19/24  9682     Patient presents with: Abdominal Pain   Teresa Clark is a 43 y.o. female.   Teresa Clark is a 43 y.o. female with a history of of peptic ulcer disease, who presents to the emergency department for evaluation of 2 months of upper abdominal pain that has occurred intermittently but become worse and more persistent the last 2 days.  Patient reports pain is typically worse after eating, she stopped eating spicy foods because this was making it worse.  Has tried Tylenol  with minimal relief, has had some constipation passing small amounts of stool, has not noticed any melena or hematochezia.  No nausea or vomiting.  This does feel similar to a few years ago when she was having stomach pains and was told she had an ulcer.  Has not tried any Pepcid , Tums or PPIs.  No prior abdominal surgeries aside from C-section.  No other aggravating or alleviating factors.  The history is provided by the patient and medical records. The history is limited by a language barrier. A language interpreter was used.  Abdominal Pain Associated symptoms: no chest pain, no chills, no cough, no diarrhea, no dysuria, no fever, no nausea, no shortness of breath and no vomiting        Prior to Admission medications   Medication Sig Start Date End Date Taking? Authorizing Provider  acetaminophen  (TYLENOL ) 500 MG tablet Take 2 tablets (1,000 mg total) by mouth every 6 (six) hours. Patient not taking: Reported on 10/29/2023 09/30/23   Cleatus Moccasin, MD  ibuprofen  (ADVIL ) 600 MG tablet Take 1 tablet (600 mg total) by mouth every 6 (six) hours. Patient not taking: Reported on 10/29/2023 09/30/23   Cleatus Moccasin, MD  oxyCODONE  (OXY IR/ROXICODONE ) 5 MG immediate release tablet Take 1-2 tablets (5-10 mg total) by mouth every 4 (four) hours as needed for moderate pain. Patient not taking: Reported on  10/18/2023 09/30/23   Cleatus Moccasin, MD  Prenatal Vit-Fe Fumarate-FA (PREPLUS) 27-1 MG TABS Take 1 tablet by mouth daily. 07/31/23   Ervin, Michael L, MD    Allergies: Patient has no known allergies.    Review of Systems  Constitutional:  Negative for chills and fever.  Respiratory:  Negative for cough and shortness of breath.   Cardiovascular:  Negative for chest pain.  Gastrointestinal:  Positive for abdominal pain. Negative for blood in stool, diarrhea, nausea and vomiting.  Genitourinary:  Negative for dysuria and frequency.  All other systems reviewed and are negative.   Updated Vital Signs BP 122/83   Pulse (!) 53   Temp 98.2 F (36.8 C) (Oral)   Resp 18   Ht 4' 8 (1.422 m)   Wt 65.8 kg   SpO2 99%   BMI 32.51 kg/m   Physical Exam Vitals and nursing note reviewed.  Constitutional:      General: She is not in acute distress.    Appearance: Normal appearance. She is well-developed. She is not diaphoretic.  HENT:     Head: Normocephalic and atraumatic.  Eyes:     General:        Right eye: No discharge.        Left eye: No discharge.     Pupils: Pupils are equal, round, and reactive to light.  Cardiovascular:     Rate and Rhythm: Normal rate and regular rhythm.     Pulses:  Normal pulses.     Heart sounds: Normal heart sounds.  Pulmonary:     Effort: Pulmonary effort is normal. No respiratory distress.     Breath sounds: Normal breath sounds. No wheezing or rales.     Comments: Respirations equal and unlabored, patient able to speak in full sentences, lungs clear to auscultation bilaterally  Abdominal:     General: Bowel sounds are normal. There is no distension.     Palpations: Abdomen is soft. There is no mass.     Tenderness: There is abdominal tenderness in the right upper quadrant and epigastric area. There is no guarding.     Comments: Abdomen soft, nondistended, bowel sounds present throughout, there is some tenderness in the epigastric region and right  upper quadrants, negative Murphy sign, no lower abdominal tenderness  Musculoskeletal:        General: No deformity.     Cervical back: Neck supple.  Skin:    General: Skin is warm and dry.     Capillary Refill: Capillary refill takes less than 2 seconds.  Neurological:     Mental Status: She is alert and oriented to person, place, and time.     Coordination: Coordination normal.     Comments: Speech is clear, able to follow commands CN III-XII intact Normal strength in upper and lower extremities bilaterally including dorsiflexion and plantar flexion, strong and equal grip strength Sensation normal to light and sharp touch Moves extremities without ataxia, coordination intact  Psychiatric:        Mood and Affect: Mood normal.        Behavior: Behavior normal.     (all labs ordered are listed, but only abnormal results are displayed) Labs Reviewed  COMPREHENSIVE METABOLIC PANEL WITH GFR - Abnormal; Notable for the following components:      Result Value   CO2 21 (*)    Glucose, Bld 117 (*)    Calcium 11.2 (*)    All other components within normal limits  CBC - Abnormal; Notable for the following components:   WBC 10.8 (*)    All other components within normal limits  URINALYSIS, ROUTINE W REFLEX MICROSCOPIC - Abnormal; Notable for the following components:   Hgb urine dipstick SMALL (*)    Leukocytes,Ua MODERATE (*)    Bacteria, UA RARE (*)    All other components within normal limits  LIPASE, BLOOD  HCG, SERUM, QUALITATIVE    EKG: None  Radiology: No results found.   Procedures   Medications Ordered in the ED - No data to display                                  Medical Decision Making Amount and/or Complexity of Data Reviewed Labs: ordered. Radiology: ordered.  Risk OTC drugs. Prescription drug management.   Patient presents to the ED with complaints of abdominal pain. Patient nontoxic appearing, in no apparent distress, vitals WNL . On exam patient  tender to palpation in RUQ and epigastric region, no peritoneal signs. Will evaluate with labs and RUQ US .   Ddx including but not limited to: Peptic ulcer disease, gastritis, gastroenteritis, cholecystitis, pancreatitis  Additional history obtained:  Additional history obtained from chart review & nursing note review.   Lab Tests:  I Ordered, viewed, and interpreted labs, which included:  CBC: Minimal leukocytosis, normal hemoglobin CMP: No significant electrolyte derangements, normal renal and liver function Lipase: WNL UA: Moderate leukocytes  with rare bacteria, no urinary symptoms to suggest UTI Preg test: Negative  Imaging Studies ordered:  I ordered imaging studies which included right upper quadrant ultrasound, I independently reviewed, formal radiology impression shows:  Tiny amount of sludge, no evidence of cholecystitis  ED Course:  I ordered Protonix , Pepcid  and Maalox   RE-EVAL: Feeling much better, pain has almost completely resolved  On repeat abdominal exam patient remains without peritoneal signs, low suspicion for cholecystitis, pancreatitis, diverticulitis, appendicitis, bowel obstruction/perforation,  PID, ectopic pregnancy, or other acute surgical process. Patient tolerating PO in the emergency department. Will discharge home with supportive measures and treatment for likely gastritis versus peptic ulcer disease. I discussed results, treatment plan, need for PCP/GI follow-up using translator, and discussed return precautions with the patient. Provided opportunity for questions, patient confirmed understanding and is in agreement with plan.   Portions of this note were generated with Scientist, clinical (histocompatibility and immunogenetics). Dictation errors may occur despite best attempts at proofreading.        Final diagnoses:  Upper abdominal pain    ED Discharge Orders          Ordered    famotidine  (PEPCID ) 20 MG tablet  2 times daily        06/19/24 1109    omeprazole  (PRILOSEC)  20 MG capsule  2 times daily before meals        06/19/24 1109               Alva Larraine FALCON, PA-C 06/26/24 9171    Ula Prentice SAUNDERS, MD 06/26/24 [phone]

## 2024-06-19 NOTE — ED Notes (Signed)
 Pt back in room from [place].

## 2024-06-19 NOTE — Discharge Instructions (Addendum)
 Take the prescribed medications twice daily before food.  Avoid spicy or acidic foods, and avoid alcohol or any NSAIDs like Motrin , Advil , ibuprofen , aspirin, Goody powder.  Follow-up with your primary care provider as well as the GI doctor provided on your paperwork today.  Return if you have significantly worsened abdominal pain or notice black or bloody stools.

## 2024-06-25 ENCOUNTER — Ambulatory Visit: Attending: Nurse Practitioner | Admitting: Nurse Practitioner

## 2024-06-25 ENCOUNTER — Encounter: Payer: Self-pay | Admitting: Nurse Practitioner

## 2024-06-25 VITALS — BP 117/80 | HR 61 | Resp 19 | Ht <= 58 in | Wt 137.6 lb

## 2024-06-25 DIAGNOSIS — R1010 Upper abdominal pain, unspecified: Secondary | ICD-10-CM | POA: Diagnosis not present

## 2024-06-25 DIAGNOSIS — Z1231 Encounter for screening mammogram for malignant neoplasm of breast: Secondary | ICD-10-CM | POA: Diagnosis not present

## 2024-06-25 NOTE — Patient Instructions (Signed)
 [facility] Located in: [facility] Address: [address], [place], [place] [address] Phone: [phone]

## 2024-06-25 NOTE — Progress Notes (Signed)
 Assessment and Plan Abdominal Pain Abdominal ultrasound from ER slightly abnormal showing gallbladder sludge, requires further investigation. History of similar pain episodes resolving without medication. - Refer to gastroenterologist for further evaluation. - Perform laboratory test for bacterial infection in the stomach.  Language Barrier Delay in obtaining interpreter affected visit efficiency. Daughter can assist with interpretation if needed in the future. - Ensure daughter's availability for interpretation during future visits.  Incidental finding of elevated calcium.  Will repeat with parathyroid and vitamin D  levels.   Patient has been counseled on age-appropriate routine health concerns for screening and prevention. These are reviewed and up-to-date. Referrals have been placed accordingly. Immunizations are up-to-date or declined.    Subjective:   Chief Complaint  Patient presents with   Abdominal Pain    Teresa Clark 43 y.o. female presents to office today with complaints of abdominal pain.  She is a previous patient of Dr. Brien.  She has a past medical history of Gestational diabetes and Influenza B (01/21/2019).   VRI was used to communicate directly with patient for the entire encounter including providing detailed patient instructions.    History of Present Illness The patient presents with abdominal pain and abnormal ultrasound findings. She is accompanied by her daughter, who usually assists with interpreting.  She experienced abdominal pain six days ago, prompting a visit to the emergency room.  She was discharged from the ED on Pepcid  and omeprazole  and is currently taking as prescribed.  She denies any worsening abdominal pain today.  The pain she recently had was similar to previous episodes. Since then, the pain has improved, and she currently has no nausea, vomiting, diarrhea, or constipation. No blood in her stool or when wiping.  She does not recall  taking any medication for the abdominal pain in the past, and it typically resolves on its own. 06-19-2024 abdominal ultrasound results: 1. Probable tiny amount of dependent sludge/calculi without sonographic evidence of acute cholecystitis. 2. Increased hepatic echogenicity, a nonspecific finding that is most commonly seen on the basis of steatosis in the absence of known liver disease.  Review of Systems  Constitutional:  Negative for fever, malaise/fatigue and weight loss.  HENT: Negative.  Negative for nosebleeds.   Eyes: Negative.  Negative for blurred vision, double vision and photophobia.  Respiratory: Negative.  Negative for cough and shortness of breath.   Cardiovascular: Negative.  Negative for chest pain, palpitations and leg swelling.  Gastrointestinal:  Positive for abdominal pain. Negative for heartburn, nausea and vomiting.  Musculoskeletal: Negative.  Negative for myalgias.  Neurological: Negative.  Negative for dizziness, focal weakness, seizures and headaches.  Psychiatric/Behavioral: Negative.  Negative for suicidal ideas.     Past Medical History:  Diagnosis Date   Gestational diabetes    Normal postpartum 2 hr GTT   Influenza B 01/21/2019    Past Surgical History:  Procedure Laterality Date   CESAREAN SECTION     CESAREAN SECTION MULTI-GESTATIONAL N/A 01/21/2019   Procedure: CESAREAN SECTION MULTI-GESTATIONAL;  Surgeon: Barbra Lang PARAS, DO;  Location: [facility];  Service: Obstetrics;  Laterality: N/A;   CESAREAN SECTION WITH BILATERAL TUBAL LIGATION N/A 09/28/2023   Procedure: CESAREAN SECTION WITH BILATERAL TUBAL LIGATION;  Surgeon: Lorence Ozell CROME, MD;  Location: [facility] LD [facility];  Service: Obstetrics;  Laterality: N/A;   NO PAST SURGERIES      Family History  Family history unknown: Yes    Social History Reviewed with no changes to be made today.  Outpatient Medications Prior to Visit  Medication Sig Dispense Refill   famotidine  (PEPCID ) 20 MG  tablet Take 1 tablet (20 mg total) by mouth 2 (two) times daily. 30 tablet 0   omeprazole  (PRILOSEC) 20 MG capsule Take 1 capsule (20 mg total) by mouth 2 (two) times daily before a meal. 60 capsule 0   acetaminophen  (TYLENOL ) 500 MG tablet Take 2 tablets (1,000 mg total) by mouth every 6 (six) hours. (Patient not taking: Reported on 10/29/2023) 30 tablet 0   ibuprofen  (ADVIL ) 600 MG tablet Take 1 tablet (600 mg total) by mouth every 6 (six) hours. (Patient not taking: Reported on 10/29/2023) 30 tablet 0   oxyCODONE  (OXY IR/ROXICODONE ) 5 MG immediate release tablet Take 1-2 tablets (5-10 mg total) by mouth every 4 (four) hours as needed for moderate pain. (Patient not taking: Reported on 10/18/2023) 30 tablet 0   Prenatal Vit-Fe Fumarate-FA (PREPLUS) 27-1 MG TABS Take 1 tablet by mouth daily. 30 tablet 13   No facility-administered medications prior to visit.    No Known Allergies     Objective:    BP 117/80 (BP Location: Left Arm, Patient Position: Sitting, Cuff Size: Normal)   Pulse 61   Resp 19   Ht 4' 8 (1.422 m)   Wt 137 lb 9.6 oz (62.4 kg)   LMP  (LMP Unknown)   SpO2 100%   Breastfeeding Yes   BMI 30.85 kg/m  Wt Readings from Last 3 Encounters:  06/25/24 137 lb 9.6 oz (62.4 kg)  06/19/24 145 lb (65.8 kg)  10/29/23 136 lb 6.4 oz (61.9 kg)    Physical Exam Vitals and nursing note reviewed.  Constitutional:      Appearance: She is well-developed.  HENT:     Head: Normocephalic and atraumatic.  Cardiovascular:     Rate and Rhythm: Normal rate and regular rhythm.     Heart sounds: Normal heart sounds. No murmur heard.    No friction rub. No gallop.  Pulmonary:     Effort: Pulmonary effort is normal. No tachypnea or respiratory distress.     Breath sounds: Normal breath sounds. No decreased breath sounds, wheezing, rhonchi or rales.  Chest:     Chest wall: No tenderness.  Abdominal:     General: Abdomen is protuberant. Bowel sounds are normal.     Palpations: Abdomen  is soft.     Tenderness: There is no abdominal tenderness.  Musculoskeletal:        General: Normal range of motion.     Cervical back: Normal range of motion.  Skin:    General: Skin is warm and dry.  Neurological:     Mental Status: She is alert and oriented to person, place, and time.     Coordination: Coordination normal.  Psychiatric:        Behavior: Behavior normal. Behavior is cooperative.        Thought Content: Thought content normal.        Judgment: Judgment normal.          Patient has been counseled extensively about nutrition and exercise as well as the importance of adherence with medications and regular follow-up. The patient was given clear instructions to go to ER or return to medical center if symptoms don't improve, worsen or new problems develop. The patient verbalized understanding.   Follow-up: Return in about 6 months (around 12/26/2024).   Haze LELON Servant, FNP-BC [facility] [place], [place] [phone]   06/25/2024, 5:07  PM

## 2024-06-27 LAB — PTH, INTACT AND CALCIUM
Calcium: 9.3 mg/dL (ref 8.7–10.2)
PTH: 30 pg/mL (ref 15–65)

## 2024-06-27 LAB — H. PYLORI BREATH TEST: H pylori Breath Test: POSITIVE — AB

## 2024-06-27 LAB — VITAMIN D 25 HYDROXY (VIT D DEFICIENCY, FRACTURES): Vit D, 25-Hydroxy: 27.2 ng/mL — ABNORMAL LOW (ref 30.0–100.0)

## 2024-06-27 LAB — H. PYLORI BREATH COLLECTION

## 2024-06-30 ENCOUNTER — Ambulatory Visit: Payer: Self-pay | Admitting: Nurse Practitioner

## 2024-06-30 DIAGNOSIS — A048 Other specified bacterial intestinal infections: Secondary | ICD-10-CM

## 2024-06-30 MED ORDER — BISMUTH SUBSALICYLATE 262 MG PO CHEW
524.0000 mg | CHEWABLE_TABLET | Freq: Three times a day (TID) | ORAL | 0 refills | Status: AC
Start: 2024-06-30 — End: 2024-07-14

## 2024-06-30 MED ORDER — TETRACYCLINE HCL 500 MG PO CAPS
500.0000 mg | ORAL_CAPSULE | Freq: Four times a day (QID) | ORAL | 0 refills | Status: DC
Start: 2024-06-30 — End: 2024-07-07

## 2024-06-30 MED ORDER — METRONIDAZOLE 500 MG PO TABS
500.0000 mg | ORAL_TABLET | Freq: Three times a day (TID) | ORAL | 0 refills | Status: AC
Start: 2024-06-30 — End: 2024-07-14

## 2024-07-07 ENCOUNTER — Telehealth: Payer: Self-pay | Admitting: Critical Care Medicine

## 2024-07-07 ENCOUNTER — Other Ambulatory Visit: Payer: Self-pay

## 2024-07-07 DIAGNOSIS — A048 Other specified bacterial intestinal infections: Secondary | ICD-10-CM

## 2024-07-07 MED ORDER — TETRACYCLINE HCL 500 MG PO CAPS: 500.0000 mg | ORAL_CAPSULE | Freq: Four times a day (QID) | ORAL | 0 refills | Status: AC

## 2024-07-07 NOTE — Telephone Encounter (Signed)
Already sent to new pharmacy

## 2024-07-07 NOTE — Telephone Encounter (Signed)
 Copied from CRM [id number]. Topic: Clinical - Prescription Issue >> Jul 07, 2024 11:30 AM Edsel HERO wrote:  Patient states that [facility] was not able to fill tetracycline  (SUMYCIN ) 500 MG capsule and would like for it to be sent to [facility] instead.  [facility] [phone] - [place], [place] - [address]  Phone: [phone] Fax: [phone]

## 2024-07-09 ENCOUNTER — Other Ambulatory Visit: Payer: Self-pay | Admitting: Critical Care Medicine

## 2024-07-09 ENCOUNTER — Other Ambulatory Visit: Payer: Self-pay

## 2024-07-09 NOTE — Telephone Encounter (Unsigned)
 Copied from CRM [id number]. Topic: Clinical - Medication Refill >> Jul 09, 2024 11:28 AM Antwanette L wrote: Medication: famotidine  (PEPCID ) 20 MG tablet   Has the patient contacted their pharmacy? No   This is the patient's preferred pharmacy:  [facility] [phone] - [place], [place] - [address] AT [facility] [address] [place] [address] Phone: [phone] Fax: [phone]  Is this the correct pharmacy for this prescription? Yes   Has the prescription been filled recently? Yes. Last refill was on 06/19/24 by Larraine Sicard PA-C  Is the patient out of the medication? Yes  Has the patient been seen for an appointment in the last year OR does the patient have an upcoming appointment? Yes. Last ov was on 06/25/24 with Zelda Fleming NP  Can we respond through MyChart? No. Contact the patient by phone at [phone]  Agent: Please be advised that Rx refills may take up to 3 business days. We ask that you follow-up with your pharmacy.

## 2024-07-10 ENCOUNTER — Other Ambulatory Visit: Payer: Self-pay | Admitting: Critical Care Medicine

## 2024-07-10 NOTE — Telephone Encounter (Unsigned)
 Copied from CRM [id number]. Topic: Clinical - Medication Refill >> Jul 10, 2024  1:56 PM Berwyn MATSU wrote: Medication: famotidine  (PEPCID ) 20 MG tablet  Has the patient contacted their pharmacy? Yes (Agent: If no, request that the patient contact the pharmacy for the refill. If patient does not wish to contact the pharmacy document the reason why and proceed with request.) (Agent: If yes, when and what did the pharmacy advise?)  This is the patient's preferred pharmacy:  [facility] [phone] - [place], [place] - [address] AT [facility] [address]  [place] [address] Phone: [phone] Fax: [phone]   Is this the correct pharmacy for this prescription? Yes If no, delete pharmacy and type the correct one.   Has the prescription been filled recently? Yes  Is the patient out of the medication? Yes  Has the patient been seen for an appointment in the last year OR does the patient have an upcoming appointment? Yes  Can we respond through MyChart? Yes  Agent: Please be advised that Rx refills may take up to 3 business days. We ask that you follow-up with your pharmacy.

## 2024-07-11 MED ORDER — FAMOTIDINE 20 MG PO TABS: 20.0000 mg | ORAL_TABLET | Freq: Two times a day (BID) | ORAL | 0 refills | Status: DC

## 2024-07-11 NOTE — Telephone Encounter (Signed)
 Requested medication (s) are due for refill today: yes  Requested medication (s) are on the active medication list: yes  Last refill:  06/19/24  Future visit scheduled: yes  Notes to clinic:  Unable to refill per protocol, last refill by another provider.      Requested Prescriptions  Pending Prescriptions Disp Refills   famotidine  (PEPCID ) 20 MG tablet 30 tablet 0    Sig: Take 1 tablet (20 mg total) by mouth 2 (two) times daily.     Gastroenterology:  H2 Antagonists Passed - 07/11/2024  9:20 AM      Passed - Valid encounter within last 12 months    Recent Outpatient Visits           2 weeks ago Upper abdominal pain   [place] [facility] [place]. [facility] Theotis Haze ORN, NP   1 year ago Early stage of pregnancy    [facility] [place]. [facility] Brien Belvie BRAVO, MD       Future Appointments             In 5 months Fleming, Zelda W, NP [facility] [place] [facility] [place] [facility]. [facility]

## 2024-07-11 NOTE — Telephone Encounter (Signed)
 Requested Prescriptions  Pending Prescriptions Disp Refills   famotidine  (PEPCID ) 20 MG tablet 30 tablet 0    Sig: Take 1 tablet (20 mg total) by mouth 2 (two) times daily.     Gastroenterology:  H2 Antagonists Passed - 07/11/2024  3:41 PM      Passed - Valid encounter within last 12 months    Recent Outpatient Visits           2 weeks ago Upper abdominal pain   [place] [facility] [place]. [facility] Theotis Haze ORN, NP   1 year ago Early stage of pregnancy   [place] [facility] [place]. [facility] Brien Belvie BRAVO, MD       Future Appointments             In 5 months Fleming, Zelda W, NP [facility] [place] [facility]. [facility]

## 2024-07-23 ENCOUNTER — Ambulatory Visit

## 2024-08-11 ENCOUNTER — Ambulatory Visit
Admission: RE | Admit: 2024-08-11 | Discharge: 2024-08-11 | Disposition: A | Discharge status: Home / Self Care | Source: Ambulatory Visit | Attending: Nurse Practitioner | Admitting: Nurse Practitioner

## 2024-08-11 DIAGNOSIS — Z1231 Encounter for screening mammogram for malignant neoplasm of breast: Secondary | ICD-10-CM | POA: Diagnosis not present

## 2024-08-14 ENCOUNTER — Encounter: Payer: Self-pay | Admitting: Nurse Practitioner

## 2024-08-26 ENCOUNTER — Telehealth: Payer: Self-pay | Admitting: Nurse Practitioner

## 2024-08-26 NOTE — Telephone Encounter (Signed)
Pt confirmed appt 9/10

## 2024-08-27 ENCOUNTER — Encounter: Payer: Self-pay | Admitting: Gastroenterology

## 2024-08-27 ENCOUNTER — Encounter: Payer: Self-pay | Admitting: Nurse Practitioner

## 2024-08-27 ENCOUNTER — Ambulatory Visit: Attending: Nurse Practitioner | Admitting: Nurse Practitioner

## 2024-08-27 VITALS — BP 107/71 | HR 54 | Resp 18 | Ht <= 58 in | Wt 135.0 lb

## 2024-08-27 DIAGNOSIS — A048 Other specified bacterial intestinal infections: Secondary | ICD-10-CM | POA: Diagnosis not present

## 2024-08-27 NOTE — Patient Instructions (Signed)
 [facility] [address] [place], [place] [address] ?PH# [phone] ?

## 2024-08-27 NOTE — Progress Notes (Signed)
 Assessment & Plan:  Teresa Clark was seen today for abdominal pain.  Diagnoses and all orders for this visit:  Bacterial infection due to H. pylori -     H. pylori breath test    Patient has been counseled on age-appropriate routine health concerns for screening and prevention. These are reviewed and up-to-date. Referrals have been placed accordingly. Immunizations are up-to-date or declined.    Subjective:   Chief Complaint  Patient presents with   Abdominal Pain    Teresa Clark 43 y.o. female presents to office today for follow-up to H. pylori infection.   She was treated with triple therapy on June 30, 2024 for positive H. pylori.  Today she is here today requesting follow-up H. pylori testing for eradication of bacteria.  She currently denies any abdominal pain.  VRI was used to communicate directly with patient for the entire encounter including providing detailed patient instructions.     Review of Systems  Constitutional:  Negative for fever, malaise/fatigue and weight loss.  HENT: Negative.  Negative for nosebleeds.   Eyes: Negative.  Negative for blurred vision, double vision and photophobia.  Respiratory: Negative.  Negative for cough and shortness of breath.   Cardiovascular: Negative.  Negative for chest pain, palpitations and leg swelling.  Gastrointestinal: Negative.  Negative for heartburn, nausea and vomiting.  Musculoskeletal: Negative.  Negative for myalgias.  Neurological: Negative.  Negative for dizziness, focal weakness, seizures and headaches.  Psychiatric/Behavioral: Negative.  Negative for suicidal ideas.     Past Medical History:  Diagnosis Date   Gestational diabetes    Normal postpartum 2 hr GTT   Influenza B 01/21/2019    Past Surgical History:  Procedure Laterality Date   CESAREAN SECTION     CESAREAN SECTION MULTI-GESTATIONAL N/A 01/21/2019   Procedure: CESAREAN SECTION MULTI-GESTATIONAL;  Surgeon: Barbra Lang PARAS, DO;  [facility];  Service: Obstetrics;  Laterality: N/A;   CESAREAN SECTION WITH BILATERAL TUBAL LIGATION N/A 09/28/2023   Procedure: CESAREAN SECTION WITH BILATERAL TUBAL LIGATION;  Surgeon: Lorence Ozell CROME, MD;  Location: [facility];  Service: Obstetrics;  Laterality: N/A;   NO PAST SURGERIES      Family History  Problem Relation Age of Onset   Breast cancer Neg Hx     Social History Reviewed with no changes to be made today.   Outpatient Medications Prior to Visit  Medication Sig Dispense Refill   famotidine  (PEPCID ) 20 MG tablet Take 1 tablet (20 mg total) by mouth 2 (two) times daily. (Patient not taking: Reported on 08/27/2024) 30 tablet 0   famotidine  (PEPCID ) 20 MG tablet Take 1 tablet (20 mg total) by mouth 2 (two) times daily. (Patient not taking: Reported on 08/27/2024) 30 tablet 0   omeprazole  (PRILOSEC) 20 MG capsule Take 1 capsule (20 mg total) by mouth 2 (two) times daily before a meal. (Patient not taking: Reported on 08/27/2024) 60 capsule 0   No facility-administered medications prior to visit.    No Known Allergies     Objective:    BP 107/71 (BP Location: Left Arm, Patient Position: Sitting, Cuff Size: Normal)   Pulse (!) 54   Resp 18   Ht 4' 8 (1.422 m)   Wt 135 lb (61.2 kg)   SpO2 100%   BMI 30.27 kg/m  Wt Readings from Last 3 Encounters:  08/27/24 135 lb (61.2 kg)  06/25/24 137 lb 9.6 oz (62.4 kg)  06/19/24 145 lb (65.8 kg)    Physical  Exam Vitals and nursing note reviewed.  Constitutional:      Appearance: She is well-developed.  HENT:     Head: Normocephalic and atraumatic.  Cardiovascular:     Rate and Rhythm: Normal rate and regular rhythm.     Heart sounds: Normal heart sounds. No murmur heard.    No friction rub. No gallop.  Pulmonary:     Effort: Pulmonary effort is normal. No tachypnea or respiratory distress.     Breath sounds: Normal breath sounds. No decreased breath sounds, wheezing, rhonchi or rales.  Chest:     Chest wall: No  tenderness.  Abdominal:     General: Bowel sounds are normal.     Palpations: Abdomen is soft.  Musculoskeletal:        General: Normal range of motion.     Cervical back: Normal range of motion.  Skin:    General: Skin is warm and dry.  Neurological:     Mental Status: She is alert and oriented to person, place, and time.     Coordination: Coordination normal.  Psychiatric:        Behavior: Behavior normal. Behavior is cooperative.        Thought Content: Thought content normal.        Judgment: Judgment normal.          Patient has been counseled extensively about nutrition and exercise as well as the importance of adherence with medications and regular follow-up. The patient was given clear instructions to go to ER or return to medical center if symptoms don't improve, worsen or new problems develop. The patient verbalized understanding.   Follow-up: Return if symptoms worsen or fail to improve.   Haze LELON Servant, FNP-BC [facility] [place], [place] [phone]   08/27/2024, 1:26 PM

## 2024-08-29 LAB — H. PYLORI BREATH TEST: H pylori Breath Test: NEGATIVE

## 2024-08-29 LAB — H. PYLORI BREATH COLLECTION

## 2024-09-02 ENCOUNTER — Ambulatory Visit: Payer: Self-pay | Admitting: Nurse Practitioner

## 2024-10-21 ENCOUNTER — Encounter: Payer: Self-pay | Admitting: Gastroenterology

## 2024-10-21 ENCOUNTER — Ambulatory Visit: Admitting: Gastroenterology

## 2024-10-21 VITALS — BP 112/64 | HR 79 | Ht <= 58 in | Wt 132.0 lb

## 2024-10-21 DIAGNOSIS — Z8619 Personal history of other infectious and parasitic diseases: Secondary | ICD-10-CM | POA: Diagnosis not present

## 2024-10-21 NOTE — Progress Notes (Signed)
 10/21/2024 Teresa Clark [id number] [birth year]   Discussed the use of AI scribe software for clinical note transcription with the patient, who gave verbal consent to proceed.  History of Present Illness Teresa Clark is a 43 year old female who says that she is not sure why she is here today. She was referred for evaluation of abdominal pain back in July.  Since then she was diagnosed with H. pylori infection and underwent treatment with flagyl  and tetracycline  and bismuth  with PPI.  A breath test conducted about a month ago was negative for H. pylori. Prior to treatment, she experienced abdominal pain and difficulty with bowel movements,.  Currently, she has no abdominal pain and her bowel movements have improved, occurring regularly every day.  She is here today with her husband and he corroborates her story.  She speaks [place] and so a video interpreter was used during the entirety of the visit.   EGD 03/2017 with Dr. Legrand: - Normal larynx. - Normal esophagus. - Non- bleeding erosive gastropathy. - Normal examined duodenum. Biopsied. - Several biopsies were obtained in the gastric body and in the gastric antrum.  1. Surgical [P], 2nd portion of duodenum - BENIGN DUODENAL MUCOSA - NO VILLOUS BLUNTING OR INCREASED INTRAEPITHELIAL LYMPHOCYTES 2. Surgical [P], gastric antrum - ACTIVE CHRONIC GASTRITIS - H. PYLORI ORGANISMS PRESENT - NO INTESTINAL METAPLASIA IDENTIFIED 3. Surgical [P], gastric body - CHRONIC GASTRITIS - NO INTESTINAL METAPLASIA IDENTIFIED - SEE COMMENT  It looks like we treated her with doxycycline , flagyl , bismuth , and PPI after this EGD but she never did follow-up testing to confirm eradication.  Ultrasound RUQ 06/2024: IMPRESSION: 1. Probable tiny amount of dependent sludge/calculi without sonographic evidence of acute cholecystitis. 2. Increased hepatic echogenicity, a nonspecific finding that is most commonly seen on the basis of steatosis in the absence of  known liver disease.  Recent ultrasound with fatty liver.  LFTs normal and low Fib4 score at 0.71.   Past Medical History:  Diagnosis Date   Gestational diabetes    Normal postpartum 2 hr GTT   H. pylori infection    Influenza B 01/21/2019   Past Surgical History:  Procedure Laterality Date   CESAREAN SECTION     CESAREAN SECTION MULTI-GESTATIONAL N/A 01/21/2019   Procedure: CESAREAN SECTION MULTI-GESTATIONAL;  Surgeon: Barbra Lang PARAS, DO;  [facility]: [facility];  Service: Obstetrics;  Laterality: N/A;   CESAREAN SECTION WITH BILATERAL TUBAL LIGATION N/A 09/28/2023   Procedure: CESAREAN SECTION WITH BILATERAL TUBAL LIGATION;  Surgeon: Lorence Ozell CROME, MD;  Location: [facility];  Service: Obstetrics;  Laterality: N/A;    reports that she has never smoked. She has never used smokeless tobacco. She reports that she does not drink alcohol and does not use drugs. family history is not on file. No Known Allergies    Outpatient Encounter Medications as of 10/21/2024  Medication Sig   [DISCONTINUED] famotidine  (PEPCID ) 20 MG tablet Take 1 tablet (20 mg total) by mouth 2 (two) times daily. (Patient not taking: Reported on 08/27/2024)   [DISCONTINUED] famotidine  (PEPCID ) 20 MG tablet Take 1 tablet (20 mg total) by mouth 2 (two) times daily. (Patient not taking: Reported on 08/27/2024)   [DISCONTINUED] omeprazole  (PRILOSEC) 20 MG capsule Take 1 capsule (20 mg total) by mouth 2 (two) times daily before a meal. (Patient not taking: Reported on 08/27/2024)   No facility-administered encounter medications on file as of 10/21/2024.    REVIEW OF SYSTEMS  : All other  systems reviewed and negative except where noted in the History of Present Illness.   PHYSICAL EXAM: BP 112/64   Pulse 79   Ht 4' 8 (1.422 m)   Wt 132 lb (59.9 kg)   BMI 29.59 kg/m  General: Well developed Asian female in no acute distress Assessment & Plan History of Helicobacter pylori infection with resolved  abdominal pain Previously diagnosed with Helicobacter pylori infection, treated successfully as confirmed by a negative breath test one month ago. Initial symptoms included abdominal pain and constipation, which have resolved following treatment. No current abdominal pain or bowel movement issues. Improvement likely due to successful eradication of H. pylori. - No further gastroenterology intervention required at this time.  Work note given.  Follow-up as needed.  Patient of Dr. Clayburn.   CC:  Theotis Haze ORN, NP

## 2024-10-21 NOTE — Progress Notes (Signed)
 ____________________________________________________________  Attending physician addendum:  Thank you for sending this case to me. I have reviewed the entire note and agree with the plan.  Strange that the H. pylori infection would clear on this occasion after retreatment with the same antibiotic regimen used in the past.  Victory Brand, MD  ____________________________________________________________

## 2024-12-24 ENCOUNTER — Telehealth: Payer: Self-pay | Admitting: Nurse Practitioner

## 2024-12-24 NOTE — Telephone Encounter (Signed)
 Pt confirmed appt (per vr)

## 2024-12-26 ENCOUNTER — Ambulatory Visit: Attending: Nurse Practitioner | Admitting: Nurse Practitioner

## 2024-12-26 ENCOUNTER — Encounter: Payer: Self-pay | Admitting: Nurse Practitioner

## 2024-12-26 VITALS — BP 106/73 | HR 68 | Ht <= 58 in | Wt 126.6 lb

## 2024-12-26 DIAGNOSIS — Z1231 Encounter for screening mammogram for malignant neoplasm of breast: Secondary | ICD-10-CM

## 2024-12-26 DIAGNOSIS — R42 Dizziness and giddiness: Secondary | ICD-10-CM

## 2024-12-26 DIAGNOSIS — K219 Gastro-esophageal reflux disease without esophagitis: Secondary | ICD-10-CM

## 2024-12-26 DIAGNOSIS — E785 Hyperlipidemia, unspecified: Secondary | ICD-10-CM

## 2024-12-26 DIAGNOSIS — Z23 Encounter for immunization: Secondary | ICD-10-CM | POA: Diagnosis not present

## 2024-12-26 MED ORDER — OMEPRAZOLE 20 MG PO CPDR: 20.0000 mg | DELAYED_RELEASE_CAPSULE | Freq: Every day | ORAL | 3 refills | Status: AC

## 2024-12-26 MED ORDER — OMEPRAZOLE 20 MG PO CPDR: 20.0000 mg | DELAYED_RELEASE_CAPSULE | Freq: Every day | ORAL | 3 refills | Status: DC

## 2024-12-26 NOTE — Addendum Note (Signed)
 Addended by: LIANE JACKOLYN GAILS on: 12/26/2024 09:13 AM   Modules accepted: Orders

## 2024-12-26 NOTE — Progress Notes (Signed)
 "  Assessment & Plan:  Alzina was seen today for medical management of chronic issues.  Diagnoses and all orders for this visit:  Dizziness -     CBC with Differential -     Iron , TIBC and Ferritin Panel Avoid sudden movements Should drink at least 64-80oz of water  daily  Breast cancer screening by mammogram -     MM 3D SCREENING MAMMOGRAM BILATERAL BREAST; Future  Serum calcium elevated -     CMP14+EGFR  Dyslipidemia, goal LDL below 100 -     Lipid panel  Gastroesophageal reflux disease without esophagitis -     omeprazole  (PRILOSEC) 20 MG capsule; Take 1 capsule (20 mg total) by mouth daily. For stomach burning INSTRUCTIONS: Avoid GERD Triggers: acidic, spicy or fried foods, caffeine, coffee, sodas,  alcohol and chocolate.      Patient has been counseled on age-appropriate routine health concerns for screening and prevention. These are reviewed and up-to-date. Referrals have been placed accordingly. Immunizations are up-to-date or declined.    Subjective:   Chief Complaint  Patient presents with   Medical Management of Chronic Issues    Teresa Clark 44 y.o. female presents to office today for dizziness  VRI was used to communicate directly with patient for the entire encounter including providing detailed patient instructions.     She is experiencing infrequent dizziness. Can last from seconds to minutes. She is usually moving when the dizziness occurs. No other associated symptoms.    She also is experiencing burning and pain in stomach when eating spicy foods or drinking milk.  She has a history of Hpylori and was treated with repeat Hpylori negative.      Review of Systems  Constitutional:  Negative for fever, malaise/fatigue and weight loss.  HENT: Negative.  Negative for nosebleeds.   Eyes: Negative.  Negative for blurred vision, double vision and photophobia.  Respiratory: Negative.  Negative for cough and shortness of breath.   Cardiovascular:  Positive for  palpitations. Negative for chest pain and leg swelling.  Gastrointestinal: Negative.  Negative for heartburn, nausea and vomiting.  Musculoskeletal: Negative.  Negative for myalgias.  Neurological:  Positive for dizziness. Negative for focal weakness, seizures and headaches.  Psychiatric/Behavioral: Negative.  Negative for suicidal ideas.     Past Medical History:  Diagnosis Date   Gestational diabetes    Normal postpartum 2 hr GTT   H. pylori infection    Influenza B 01/21/2019    Past Surgical History:  Procedure Laterality Date   CESAREAN SECTION     CESAREAN SECTION MULTI-GESTATIONAL N/A 01/21/2019   Procedure: CESAREAN SECTION MULTI-GESTATIONAL;  Surgeon: Barbra Lang PARAS, DO;  Location: [facility];  Service: Obstetrics;  Laterality: N/A;   CESAREAN SECTION WITH BILATERAL TUBAL LIGATION N/A 09/28/2023   Procedure: CESAREAN SECTION WITH BILATERAL TUBAL LIGATION;  Surgeon: Lorence Ozell CROME, MD;  Location: [facility];  Service: Obstetrics;  Laterality: N/A;    Family History  Problem Relation Age of Onset   Breast cancer Neg Hx     Social History Reviewed with no changes to be made today.   No outpatient medications prior to visit.   No facility-administered medications prior to visit.    Allergies[1]     Objective:    BP 106/73 (BP Location: Left Arm, Patient Position: Sitting, Cuff Size: Normal)   Pulse 68   Ht 4' 8 (1.422 m)   Wt 126 lb 9.6 oz (57.4 kg)   LMP 12/22/2024 (Approximate)  SpO2 100%   Breastfeeding No   BMI 28.38 kg/m  Wt Readings from Last 3 Encounters:  12/26/24 126 lb 9.6 oz (57.4 kg)  10/21/24 132 lb (59.9 kg)  08/27/24 135 lb (61.2 kg)    Physical Exam Vitals and nursing note reviewed.  Constitutional:      Appearance: She is well-developed.  HENT:     Head: Normocephalic and atraumatic.  Cardiovascular:     Rate and Rhythm: Normal rate and regular rhythm.     Heart sounds: Normal heart sounds. No murmur heard.    No  friction rub. No gallop.  Pulmonary:     Effort: Pulmonary effort is normal. No tachypnea or respiratory distress.     Breath sounds: Normal breath sounds. No decreased breath sounds, wheezing, rhonchi or rales.  Chest:     Chest wall: No tenderness.  Musculoskeletal:        General: Normal range of motion.     Cervical back: Normal range of motion.  Skin:    General: Skin is warm and dry.  Neurological:     Mental Status: She is alert and oriented to person, place, and time.     Coordination: Coordination normal.  Psychiatric:        Behavior: Behavior normal. Behavior is cooperative.        Thought Content: Thought content normal.        Judgment: Judgment normal.          Patient has been counseled extensively about nutrition and exercise as well as the importance of adherence with medications and regular follow-up. The patient was given clear instructions to go to ER or return to medical center if symptoms don't improve, worsen or new problems develop. The patient verbalized understanding.   Follow-up: Return in about 6 months (around 06/25/2025).   Haze LELON Servant, FNP-BC [facility] [place], [place] [phone]   12/26/2024, 9:01 AM    [1] No Known Allergies  "

## 2024-12-27 LAB — CBC WITH DIFFERENTIAL/PLATELET
Basophils Absolute: 0.1 x10E3/uL (ref 0.0–0.2)
Basos: 1 %
EOS (ABSOLUTE): 0.1 x10E3/uL (ref 0.0–0.4)
Eos: 2 %
Hematocrit: 39 % (ref 34.0–46.6)
Hemoglobin: 12.4 g/dL (ref 11.1–15.9)
Immature Grans (Abs): 0 x10E3/uL (ref 0.0–0.1)
Immature Granulocytes: 0 %
Lymphocytes Absolute: 2.3 x10E3/uL (ref 0.7–3.1)
Lymphs: 34 %
MCH: 31.7 pg (ref 26.6–33.0)
MCHC: 31.8 g/dL (ref 31.5–35.7)
MCV: 100 fL — ABNORMAL HIGH (ref 79–97)
Monocytes Absolute: 0.3 x10E3/uL (ref 0.1–0.9)
Monocytes: 4 %
Neutrophils Absolute: 4 x10E3/uL (ref 1.4–7.0)
Neutrophils: 59 %
Platelets: 259 x10E3/uL (ref 150–450)
RBC: 3.91 x10E6/uL (ref 3.77–5.28)
RDW: 11.2 % — ABNORMAL LOW (ref 11.7–15.4)
WBC: 6.8 x10E3/uL (ref 3.4–10.8)

## 2024-12-27 LAB — CMP14+EGFR
ALT: 12 IU/L (ref 0–32)
AST: 17 IU/L (ref 0–40)
Albumin: 4.5 g/dL (ref 3.9–4.9)
Alkaline Phosphatase: 56 IU/L (ref 41–116)
BUN/Creatinine Ratio: 10 (ref 9–23)
BUN: 9 mg/dL (ref 6–24)
Bilirubin Total: 0.5 mg/dL (ref 0.0–1.2)
CO2: 24 mmol/L (ref 20–29)
Calcium: 9.6 mg/dL (ref 8.7–10.2)
Chloride: 101 mmol/L (ref 96–106)
Creatinine, Ser: 0.94 mg/dL (ref 0.57–1.00)
Globulin, Total: 3.2 g/dL (ref 1.5–4.5)
Glucose: 150 mg/dL — ABNORMAL HIGH (ref 70–99)
Potassium: 3.9 mmol/L (ref 3.5–5.2)
Sodium: 140 mmol/L (ref 134–144)
Total Protein: 7.7 g/dL (ref 6.0–8.5)
eGFR: 78 mL/min/1.73

## 2024-12-27 LAB — LIPID PANEL
Chol/HDL Ratio: 5.2 ratio — ABNORMAL HIGH (ref 0.0–4.4)
Cholesterol, Total: 171 mg/dL (ref 100–199)
HDL: 33 mg/dL — ABNORMAL LOW
LDL Chol Calc (NIH): 111 mg/dL — ABNORMAL HIGH (ref 0–99)
Triglycerides: 153 mg/dL — ABNORMAL HIGH (ref 0–149)
VLDL Cholesterol Cal: 27 mg/dL (ref 5–40)

## 2024-12-27 LAB — IRON,TIBC AND FERRITIN PANEL
Ferritin: 87 ng/mL (ref 15–150)
Iron Saturation: 22 % (ref 15–55)
Iron: 62 ug/dL (ref 27–159)
Total Iron Binding Capacity: 283 ug/dL (ref 250–450)
UIBC: 221 ug/dL (ref 131–425)

## 2024-12-30 ENCOUNTER — Ambulatory Visit: Payer: Self-pay | Admitting: Nurse Practitioner

## 2025-06-25 ENCOUNTER — Ambulatory Visit: Payer: Self-pay | Admitting: Nurse Practitioner
